# Patient Record
Sex: Female | Born: 1940 | Race: White | Hispanic: No | Marital: Married | Smoking: Former smoker
Health system: Southern US, Community
[De-identification: ages and names within clinical notes are randomized; demographics above are authoritative.]

## PROBLEM LIST (undated history)

## (undated) DIAGNOSIS — E119 Type 2 diabetes mellitus without complications: Secondary | ICD-10-CM

## (undated) DIAGNOSIS — Z5189 Encounter for other specified aftercare: Secondary | ICD-10-CM

## (undated) DIAGNOSIS — I2510 Atherosclerotic heart disease of native coronary artery without angina pectoris: Secondary | ICD-10-CM

## (undated) DIAGNOSIS — I10 Essential (primary) hypertension: Secondary | ICD-10-CM

## (undated) DIAGNOSIS — F329 Major depressive disorder, single episode, unspecified: Secondary | ICD-10-CM

## (undated) DIAGNOSIS — I471 Supraventricular tachycardia, unspecified: Secondary | ICD-10-CM

## (undated) DIAGNOSIS — F32A Depression, unspecified: Secondary | ICD-10-CM

## (undated) DIAGNOSIS — F419 Anxiety disorder, unspecified: Secondary | ICD-10-CM

## (undated) DIAGNOSIS — E039 Hypothyroidism, unspecified: Secondary | ICD-10-CM

## (undated) DIAGNOSIS — I719 Aortic aneurysm of unspecified site, without rupture: Secondary | ICD-10-CM

## (undated) DIAGNOSIS — I341 Nonrheumatic mitral (valve) prolapse: Secondary | ICD-10-CM

## (undated) DIAGNOSIS — T7840XA Allergy, unspecified, initial encounter: Secondary | ICD-10-CM

## (undated) DIAGNOSIS — R911 Solitary pulmonary nodule: Secondary | ICD-10-CM

## (undated) DIAGNOSIS — M1990 Unspecified osteoarthritis, unspecified site: Secondary | ICD-10-CM

## (undated) DIAGNOSIS — I8390 Asymptomatic varicose veins of unspecified lower extremity: Secondary | ICD-10-CM

## (undated) DIAGNOSIS — J449 Chronic obstructive pulmonary disease, unspecified: Secondary | ICD-10-CM

## (undated) DIAGNOSIS — D649 Anemia, unspecified: Secondary | ICD-10-CM

## (undated) DIAGNOSIS — H269 Unspecified cataract: Secondary | ICD-10-CM

## (undated) HISTORY — DX: Solitary pulmonary nodule: R91.1

## (undated) HISTORY — DX: Anxiety disorder, unspecified: F41.9

## (undated) HISTORY — DX: Supraventricular tachycardia, unspecified: I47.10

## (undated) HISTORY — DX: Supraventricular tachycardia: I47.1

## (undated) HISTORY — DX: Depression, unspecified: F32.A

## (undated) HISTORY — DX: Unspecified cataract: H26.9

## (undated) HISTORY — DX: Atherosclerotic heart disease of native coronary artery without angina pectoris: I25.10

## (undated) HISTORY — DX: Chronic obstructive pulmonary disease, unspecified: J44.9

## (undated) HISTORY — DX: Nonrheumatic mitral (valve) prolapse: I34.1

## (undated) HISTORY — DX: Encounter for other specified aftercare: Z51.89

## (undated) HISTORY — PX: EYE SURGERY: SHX253

## (undated) HISTORY — DX: Aortic aneurysm of unspecified site, without rupture: I71.9

## (undated) HISTORY — DX: Allergy, unspecified, initial encounter: T78.40XA

## (undated) HISTORY — DX: Major depressive disorder, single episode, unspecified: F32.9

## (undated) HISTORY — DX: Hypothyroidism, unspecified: E03.9

## (undated) HISTORY — DX: Essential (primary) hypertension: I10

## (undated) HISTORY — DX: Unspecified osteoarthritis, unspecified site: M19.90

## (undated) HISTORY — DX: Anemia, unspecified: D64.9

## (undated) HISTORY — DX: Type 2 diabetes mellitus without complications: E11.9

## (undated) HISTORY — PX: BREAST SURGERY: SHX581

## (undated) HISTORY — DX: Asymptomatic varicose veins of unspecified lower extremity: I83.90

---
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
## 1970-07-04 HISTORY — PX: TUBAL LIGATION: SHX77

## 1973-07-04 HISTORY — PX: TOTAL ABDOMINAL HYSTERECTOMY: SHX209

## 2006-08-23 ENCOUNTER — Ambulatory Visit: Payer: Self-pay | Admitting: Cardiology

## 2006-09-06 ENCOUNTER — Ambulatory Visit: Payer: Self-pay

## 2006-11-03 ENCOUNTER — Ambulatory Visit: Payer: Self-pay | Admitting: Cardiology

## 2006-11-03 LAB — CONVERTED CEMR LAB
CO2: 31 meq/L (ref 19–32)
Creatinine, Ser: 1 mg/dL (ref 0.4–1.2)
Potassium: 4 meq/L (ref 3.5–5.1)
Sodium: 137 meq/L (ref 135–145)

## 2006-11-22 ENCOUNTER — Ambulatory Visit: Payer: Self-pay | Admitting: Cardiology

## 2006-11-29 ENCOUNTER — Ambulatory Visit: Payer: Self-pay | Admitting: Internal Medicine

## 2006-11-29 LAB — CONVERTED CEMR LAB
BUN: 9 mg/dL (ref 6–23)
Basophils Absolute: 0 10*3/uL (ref 0.0–0.1)
Basophils Relative: 0.6 % (ref 0.0–1.0)
CO2: 30 meq/L (ref 19–32)
GFR calc Af Amer: 81 mL/min
HCT: 36.4 % (ref 36.0–46.0)
Hemoglobin: 12.3 g/dL (ref 12.0–15.0)
Lymphocytes Relative: 23.4 % (ref 12.0–46.0)
MCHC: 33.8 g/dL (ref 30.0–36.0)
Monocytes Absolute: 0.9 10*3/uL — ABNORMAL HIGH (ref 0.2–0.7)
Monocytes Relative: 11.6 % — ABNORMAL HIGH (ref 3.0–11.0)
Neutro Abs: 4.9 10*3/uL (ref 1.4–7.7)
Neutrophils Relative %: 61.5 % (ref 43.0–77.0)
Potassium: 4 meq/L (ref 3.5–5.1)
Sodium: 135 meq/L (ref 135–145)

## 2006-12-05 ENCOUNTER — Ambulatory Visit: Payer: Self-pay | Admitting: Internal Medicine

## 2006-12-05 ENCOUNTER — Ambulatory Visit (HOSPITAL_COMMUNITY): Admission: RE | Admit: 2006-12-05 | Discharge: 2006-12-05 | Payer: Self-pay | Admitting: Internal Medicine

## 2006-12-22 ENCOUNTER — Ambulatory Visit: Payer: Self-pay | Admitting: Cardiothoracic Surgery

## 2007-01-04 ENCOUNTER — Ambulatory Visit (HOSPITAL_COMMUNITY): Admission: RE | Admit: 2007-01-04 | Discharge: 2007-01-04 | Payer: Self-pay | Admitting: Cardiothoracic Surgery

## 2007-01-16 ENCOUNTER — Ambulatory Visit: Payer: Self-pay | Admitting: Internal Medicine

## 2007-01-25 ENCOUNTER — Ambulatory Visit: Payer: Self-pay | Admitting: Internal Medicine

## 2007-01-25 ENCOUNTER — Ambulatory Visit (HOSPITAL_COMMUNITY): Admission: RE | Admit: 2007-01-25 | Discharge: 2007-01-25 | Payer: Self-pay | Admitting: Internal Medicine

## 2007-03-14 ENCOUNTER — Ambulatory Visit: Payer: Self-pay | Admitting: Cardiology

## 2007-03-14 LAB — CONVERTED CEMR LAB
BUN: 9 mg/dL (ref 6–23)
CO2: 33 meq/L — ABNORMAL HIGH (ref 19–32)
Creatinine, Ser: 0.8 mg/dL (ref 0.4–1.2)
GFR calc Af Amer: 93 mL/min
Glucose, Bld: 85 mg/dL (ref 70–99)
Potassium: 4.1 meq/L (ref 3.5–5.1)
Sodium: 134 meq/L — ABNORMAL LOW (ref 135–145)

## 2007-04-04 ENCOUNTER — Ambulatory Visit: Payer: Self-pay | Admitting: Emergency Medicine

## 2007-05-02 ENCOUNTER — Ambulatory Visit: Payer: Self-pay | Admitting: Emergency Medicine

## 2007-05-07 ENCOUNTER — Ambulatory Visit: Payer: Self-pay | Admitting: Cardiology

## 2007-05-07 ENCOUNTER — Encounter: Payer: Self-pay | Admitting: Cardiology

## 2007-05-07 ENCOUNTER — Ambulatory Visit: Payer: Self-pay

## 2007-06-04 ENCOUNTER — Ambulatory Visit: Payer: Self-pay | Admitting: Internal Medicine

## 2007-08-23 ENCOUNTER — Ambulatory Visit: Payer: Self-pay | Admitting: Cardiothoracic Surgery

## 2007-09-17 ENCOUNTER — Ambulatory Visit: Payer: Self-pay | Admitting: Cardiology

## 2007-09-17 LAB — CONVERTED CEMR LAB
BUN: 10 mg/dL (ref 6–23)
CO2: 33 meq/L — ABNORMAL HIGH (ref 19–32)
Creatinine, Ser: 0.8 mg/dL (ref 0.4–1.2)
GFR calc non Af Amer: 77 mL/min
Glucose, Bld: 92 mg/dL (ref 70–99)
TSH: 1.71 microintl units/mL (ref 0.35–5.50)

## 2007-09-27 ENCOUNTER — Ambulatory Visit: Payer: Self-pay

## 2007-09-27 ENCOUNTER — Encounter: Payer: Self-pay | Admitting: Cardiology

## 2007-11-08 ENCOUNTER — Encounter: Admission: RE | Admit: 2007-11-08 | Discharge: 2007-11-08 | Payer: Self-pay | Admitting: Cardiothoracic Surgery

## 2007-11-08 ENCOUNTER — Ambulatory Visit: Payer: Self-pay | Admitting: Cardiothoracic Surgery

## 2007-11-21 DIAGNOSIS — I868 Varicose veins of other specified sites: Secondary | ICD-10-CM | POA: Insufficient documentation

## 2007-11-21 DIAGNOSIS — I10 Essential (primary) hypertension: Secondary | ICD-10-CM

## 2007-11-21 DIAGNOSIS — E039 Hypothyroidism, unspecified: Secondary | ICD-10-CM | POA: Insufficient documentation

## 2007-11-21 DIAGNOSIS — Z8679 Personal history of other diseases of the circulatory system: Secondary | ICD-10-CM | POA: Insufficient documentation

## 2007-11-21 DIAGNOSIS — F329 Major depressive disorder, single episode, unspecified: Secondary | ICD-10-CM

## 2007-11-21 DIAGNOSIS — I059 Rheumatic mitral valve disease, unspecified: Secondary | ICD-10-CM | POA: Insufficient documentation

## 2007-11-21 DIAGNOSIS — J449 Chronic obstructive pulmonary disease, unspecified: Secondary | ICD-10-CM

## 2007-11-21 DIAGNOSIS — M129 Arthropathy, unspecified: Secondary | ICD-10-CM | POA: Insufficient documentation

## 2007-11-21 DIAGNOSIS — R911 Solitary pulmonary nodule: Secondary | ICD-10-CM | POA: Insufficient documentation

## 2007-11-22 ENCOUNTER — Ambulatory Visit: Payer: Self-pay | Admitting: Emergency Medicine

## 2007-12-24 ENCOUNTER — Ambulatory Visit: Payer: Self-pay | Admitting: Emergency Medicine

## 2007-12-24 DIAGNOSIS — J309 Allergic rhinitis, unspecified: Secondary | ICD-10-CM | POA: Insufficient documentation

## 2008-01-30 ENCOUNTER — Ambulatory Visit: Payer: Self-pay | Admitting: Emergency Medicine

## 2008-03-12 ENCOUNTER — Ambulatory Visit: Payer: Self-pay | Admitting: Emergency Medicine

## 2008-06-10 ENCOUNTER — Ambulatory Visit: Payer: Self-pay | Admitting: Emergency Medicine

## 2008-06-12 ENCOUNTER — Ambulatory Visit: Payer: Self-pay | Admitting: Cardiology

## 2008-06-16 ENCOUNTER — Telehealth (INDEPENDENT_AMBULATORY_CARE_PROVIDER_SITE_OTHER): Payer: Self-pay | Admitting: *Deleted

## 2008-07-21 ENCOUNTER — Telehealth (INDEPENDENT_AMBULATORY_CARE_PROVIDER_SITE_OTHER): Payer: Self-pay | Admitting: *Deleted

## 2008-09-01 ENCOUNTER — Ambulatory Visit: Payer: Self-pay | Admitting: Cardiology

## 2008-09-08 ENCOUNTER — Ambulatory Visit: Payer: Self-pay | Admitting: Emergency Medicine

## 2008-09-08 LAB — CONVERTED CEMR LAB
Chloride: 95 meq/L — ABNORMAL LOW (ref 96–112)
GFR calc non Af Amer: 77 mL/min
Potassium: 4.3 meq/L (ref 3.5–5.1)
Sodium: 139 meq/L (ref 135–145)

## 2008-09-19 ENCOUNTER — Encounter: Payer: Self-pay | Admitting: Emergency Medicine

## 2008-11-12 ENCOUNTER — Ambulatory Visit: Payer: Self-pay | Admitting: Cardiology

## 2008-11-19 ENCOUNTER — Encounter: Payer: Self-pay | Admitting: Emergency Medicine

## 2008-11-19 ENCOUNTER — Ambulatory Visit: Payer: Self-pay | Admitting: Cardiothoracic Surgery

## 2008-11-19 ENCOUNTER — Encounter: Payer: Self-pay | Admitting: Cardiology

## 2008-11-24 ENCOUNTER — Ambulatory Visit: Payer: Self-pay | Admitting: Emergency Medicine

## 2008-11-24 ENCOUNTER — Encounter: Payer: Self-pay | Admitting: Emergency Medicine

## 2009-03-30 ENCOUNTER — Ambulatory Visit: Payer: Self-pay | Admitting: Emergency Medicine

## 2009-05-01 ENCOUNTER — Ambulatory Visit: Payer: Self-pay | Admitting: Emergency Medicine

## 2009-05-01 DIAGNOSIS — R059 Cough, unspecified: Secondary | ICD-10-CM | POA: Insufficient documentation

## 2009-05-01 DIAGNOSIS — R05 Cough: Secondary | ICD-10-CM | POA: Insufficient documentation

## 2009-05-07 DIAGNOSIS — R0602 Shortness of breath: Secondary | ICD-10-CM

## 2009-05-07 DIAGNOSIS — I719 Aortic aneurysm of unspecified site, without rupture: Secondary | ICD-10-CM

## 2009-05-11 ENCOUNTER — Ambulatory Visit: Payer: Self-pay | Admitting: Cardiology

## 2009-05-11 DIAGNOSIS — R9431 Abnormal electrocardiogram [ECG] [EKG]: Secondary | ICD-10-CM

## 2009-05-11 DIAGNOSIS — I2510 Atherosclerotic heart disease of native coronary artery without angina pectoris: Secondary | ICD-10-CM | POA: Insufficient documentation

## 2009-05-26 ENCOUNTER — Ambulatory Visit: Payer: Self-pay | Admitting: Emergency Medicine

## 2009-06-01 ENCOUNTER — Ambulatory Visit (HOSPITAL_COMMUNITY): Admission: RE | Admit: 2009-06-01 | Discharge: 2009-06-01 | Payer: Self-pay | Admitting: Cardiology

## 2009-06-01 ENCOUNTER — Ambulatory Visit: Payer: Self-pay | Admitting: Cardiology

## 2009-06-01 ENCOUNTER — Encounter: Payer: Self-pay | Admitting: Cardiology

## 2009-06-01 ENCOUNTER — Ambulatory Visit: Payer: Self-pay

## 2009-06-01 ENCOUNTER — Ambulatory Visit: Payer: Self-pay | Admitting: Emergency Medicine

## 2009-08-03 ENCOUNTER — Telehealth (INDEPENDENT_AMBULATORY_CARE_PROVIDER_SITE_OTHER): Payer: Self-pay | Admitting: *Deleted

## 2009-09-02 ENCOUNTER — Ambulatory Visit: Payer: Self-pay | Admitting: Emergency Medicine

## 2009-09-14 ENCOUNTER — Encounter: Payer: Self-pay | Admitting: Emergency Medicine

## 2009-11-19 ENCOUNTER — Ambulatory Visit: Payer: Self-pay | Admitting: Cardiothoracic Surgery

## 2009-11-19 ENCOUNTER — Encounter: Admission: RE | Admit: 2009-11-19 | Discharge: 2009-11-19 | Payer: Self-pay | Admitting: Cardiothoracic Surgery

## 2010-01-11 ENCOUNTER — Ambulatory Visit: Payer: Self-pay | Admitting: Emergency Medicine

## 2010-01-13 ENCOUNTER — Telehealth (INDEPENDENT_AMBULATORY_CARE_PROVIDER_SITE_OTHER): Payer: Self-pay | Admitting: *Deleted

## 2010-04-27 ENCOUNTER — Telehealth (INDEPENDENT_AMBULATORY_CARE_PROVIDER_SITE_OTHER): Payer: Self-pay | Admitting: *Deleted

## 2010-05-20 ENCOUNTER — Ambulatory Visit: Payer: Self-pay | Admitting: Emergency Medicine

## 2010-05-31 ENCOUNTER — Encounter: Payer: Self-pay | Admitting: Cardiology

## 2010-05-31 ENCOUNTER — Ambulatory Visit: Payer: Self-pay | Admitting: Cardiology

## 2010-05-31 DIAGNOSIS — E785 Hyperlipidemia, unspecified: Secondary | ICD-10-CM

## 2010-06-01 ENCOUNTER — Encounter (INDEPENDENT_AMBULATORY_CARE_PROVIDER_SITE_OTHER): Payer: Self-pay | Admitting: *Deleted

## 2010-06-01 LAB — CONVERTED CEMR LAB
AST: 22 units/L (ref 0–37)
Albumin: 4.3 g/dL (ref 3.5–5.2)
CO2: 33 meq/L — ABNORMAL HIGH (ref 19–32)
Calcium: 9.7 mg/dL (ref 8.4–10.5)
Chloride: 96 meq/L (ref 96–112)
HDL: 46.6 mg/dL (ref >39.00)
LDL Cholesterol: 75 mg/dL (ref 0–99)
Potassium: 4.3 meq/L (ref 3.5–5.1)
Sodium: 139 meq/L (ref 135–145)
Total CHOL/HDL Ratio: 3
Triglycerides: 119 mg/dL (ref 0.0–149.0)

## 2010-07-25 ENCOUNTER — Encounter: Payer: Self-pay | Admitting: Thoracic Surgery (Cardiothoracic Vascular Surgery)

## 2010-08-03 NOTE — Letter (Signed)
Summary: CMN for Oxygen/[facility]  CMN for Oxygen/[facility]   Imported By: Sherian Rein 09/16/2009 12:24:50  _____________________________________________________________________  External Attachment:    Type:   Image     Comment:   External Document

## 2010-08-03 NOTE — Assessment & Plan Note (Signed)
Summary: COPD   Visit Type:  Follow-up Primary Provider/Referring Provider:  [name], 5 Points  CC:  4 mo COPD follow-up.  The patient says her breathing is worse with exertion. She has noticed increased SOB x2-3 months. Cough is both prod and non-prod.[name]  History of Present Illness: Morgan Hays is a 70 year old woman who follows up today for her COPD, dyspnea, and for a history of a RLL pulmonary nodule. 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 No sgy has been planned due to her significant lung disease.  Also with hx depression.   ROV 05/26/09 --  Began to experience more cough about 3 days ago, runny nose x 3 days, greenish phlegm. More wheeze, more SOB. In retrospect, she doesn't believe that the change from lisinopril to benecar really chaged her dry nagging cough.   ROV 06/01/09 -- Treated last time for acute exacerbation, finishing Pred + doxy. Using rescue rarely now. Almost back to baseline.   ROV 09/02/09 -- Regular f/u visit. No exacerbations since last visit. Able to do her usual activities, sometimes has to stop to rest. No cough. Ocas wheeze, responds to Proventil. Occas neb use also. Tells me that she had Pneumovax in 04/2009.   ROV 01/11/10 -- regular f/u for her COPD, cough. Tells me that about 3 weeks ago noticed some worsening DOE, some increased cough. Unfortunately since our last visit her husband was dx with colon CA. The humidity and heat are bothering her breathing. Cough is non-productive. having a lot of PND. Takes loratadine and fluticasone. Takes her BD's reliably.   Current Medications (verified): 1)  Levoxyl 125 Mcg Tabs (Levothyroxine Sodium) .[name].. 1 By Mouth Daily 2)  Benicar Hct 20-12.5 Mg Tabs (Olmesartan Medoxomil-Hctz) .[name].. 1 Tab By Mouth Once Daily 3)  Adult Aspirin Ec Low Strength 81 Mg  Tbec (Aspirin) .... Take 1 Tablet By Mouth Once A Day 4)  Multivitamins   Tabs (Multiple Vitamin) .... Take 1 Tablet By Mouth Once A Day 5)  Calcium 500 500 Mg   Tabs (Calcium Carbonate) .... Take 1 Tablet By Mouth Two Times A Day 6)  Ativan 1 Mg  Tabs (Lorazepam) .[name].. 1 Two Times A Day 7)  Proventil Hfa 108 (90 Base) Mcg/act  Aers (Albuterol Sulfate) .... Inhale 2 Puffs Every 4 Hours As Needed 8)  Miralax   Powd (Polyethylene Glycol 3350) .... As Needed 9)  Symbicort 160-4.5 Mcg/act  Aero (Budesonide-Formoterol Fumarate) .... Inhale 2 Puffs Two Times A Day 10)  Flonase 50 Mcg/act Susp (Fluticasone Propionate) .... Take 2 Sprays in Each Nostril Once A Day 11)  Albuterol Sulfate (2.5 Mg/79ml) 0.083% Nebu (Albuterol Sulfate) .[name].. 1 Vial Three Times A Day in Nebulizer 12)  Loratadine 10 Mg Tabs (Loratadine) .[name].. 1 By Mouth Once Daily 13)  Sertraline Hcl 100 Mg Tabs (Sertraline Hcl) .... 1/2 By Mouth Two Times A Day 14)  Spiriva Handihaler 18 Mcg Caps (Tiotropium Bromide Monohydrate) .... As Directed 15)  Oxygen .... As Directed 16)  Pravastatin Sodium 40 Mg Tabs (Pravastatin Sodium) .... Take One Tablet By Mouth Daily At Bedtime 17)  Trazodone Hcl 50 Mg Tabs (Trazodone Hcl) .[name].. 1 By Mouth At Bedtime 18)  Vitamin D 1000 Unit Tabs (Cholecalciferol) .[name].. 1 By Mouth Daily  Allergies (verified): No Known Drug Allergies  Vital Signs:  Patient profile:   70 year old female Height:      64 inches (162.56 cm) Weight:      168.38 pounds (76.54 kg) BMI:  29.01 O2 Sat:      90 % on 2 L/min Temp:     98.1 degrees F (36.72 degrees C) oral Pulse rate:   97 / minute BP sitting:   110 / 62  (left arm) Cuff size:   regular  Vitals Entered By: Michel Bickers CMA (January 11, 2010 1:23 PM)  O2 Sat at Rest %:  90 O2 Flow:  2 L/min CC: 4 mo COPD follow-up.  The patient says her breathing is worse with exertion. She has noticed increased SOB x2-3 months. Cough is both prod and non-prod. Comments Medications reviewed. Daytime phone verified. Michel Bickers CMA  January 11, 2010 1:24 PM   Physical Exam  General:  obese, elderly woman, NAD Head:  normocephalic and  atraumatic Nose:  no deformity, discharge, inflammation, or lesions Mouth:  erythema post pharynx Neck:  no stridor Lungs:  distant, no wheeze Heart:  regular rate and rhythm, S1, S2 without murmurs, rubs, gallops, or clicks Abdomen:  not examined Msk:  no deformity or scoliosis noted with normal posture Extremities:  trace edema Neurologic:  non-focal Skin:  intact without lesions or rashes Psych:  alert and cooperative; normal mood and affect; normal attention span and concentration   Impression & Recommendations:  Problem # 1:  C O P D (ICD-496)  Problem # 2:  COUGH (ICD-786.2)  Orders: Est. Patient Level IV ([address])  Problem # 3:  PULMONARY NODULE (ICD-518.89) Stable > 2 yrs  Problem # 4:  ALLERGIC RHINITIS (ICD-477.9)  Her updated medication list for this problem includes:    Flonase 50 Mcg/act Susp (Fluticasone propionate) .[name]... Take 2 sprays in each nostril once a day    Loratadine 10 Mg Tabs (Loratadine) .[name]... 1 by mouth once daily  Medications Added to Medication List This Visit: 1)  Levoxyl 125 Mcg Tabs (Levothyroxine sodium) .[name].. 1 by mouth daily 2)  Sertraline Hcl 100 Mg Tabs (Sertraline hcl) .... 1/2 by mouth two times a day 3)  Trazodone Hcl 50 Mg Tabs (Trazodone hcl) .[name].. 1 by mouth at bedtime 4)  Vitamin D 1000 Unit Tabs (Cholecalciferol) .[name].. 1 by mouth daily  Other Orders: Prescription Created Electronically [id number])  Patient Instructions: 1)  Please continue your current medications.  2)  Use your oxygen at all times.  3)  Increase your fluticisaone spray to 2 sprays each nostril two times a day  4)  Follow with Dr Delton Coombes in 4 months or as needed  Prescriptions: SPIRIVA HANDIHALER 18 MCG CAPS (TIOTROPIUM BROMIDE MONOHYDRATE) as directed  #30 x 11   Entered and Authorized by:   Leslye Peer MD   Signed by:   Leslye Peer MD on 01/11/2010   Method used:   Electronically to        [facility]* (retail)       [address] [place], [place]  [id number]       Ph: [phone]       Fax: [phone]   RxID:   [id number] LORATADINE 10 MG TABS (LORATADINE) 1 by mouth once daily  #30 x 11   Entered and Authorized by:   Leslye Peer MD   Signed by:   Leslye Peer MD on 01/11/2010   Method used:   Electronically to        [facility]* (retail)       [address]  [place], [place]  [address]       Ph: [phone]       Fax: [phone]   RxID:   [id number] ALBUTEROL SULFATE (2.5 MG/3ML) 0.083% NEBU (ALBUTEROL SULFATE) 1 vial three times a day in nebulizer  #1 box x 11   Entered and Authorized by:   Leslye Peer MD   Signed by:   Leslye Peer MD on 01/11/2010   Method used:   Electronically to        [facility]* (retail)       [address] [place], [place]  [id number]       Ph: [phone]       Fax: [phone]   RxID:   [id number] FLONASE 50 MCG/ACT SUSP (FLUTICASONE PROPIONATE) Take 2 sprays in each nostril once a day  #1 x 5   Entered and Authorized by:   Leslye Peer MD   Signed by:   Leslye Peer MD on 01/11/2010   Method used:   Electronically to        [facility]* (retail)       [address] [place], [place]  [id number]       Ph: [phone]       Fax: [phone]   RxID:   [id number] SYMBICORT 160-4.5 MCG/ACT  AERO (BUDESONIDE-FORMOTEROL FUMARATE) Inhale 2 puffs two times a day  #1 x 11   Entered and Authorized by:   Leslye Peer MD   Signed by:   Leslye Peer MD on 01/11/2010   Method used:   Electronically to        [facility]* (retail)       [address] [place], [place]  [address]       Ph: [phone]       Fax: [phone]   RxID:   [id number] PROVENTIL HFA 108 (90 BASE) MCG/ACT  AERS (ALBUTEROL SULFATE) Inhale 2 puffs every 4 hours as  needed  #1 x 5   Entered and Authorized by:   Leslye Peer MD   Signed by:   Leslye Peer MD on 01/11/2010   Method used:   Electronically to        [facility]* (retail)       [address] [place], [place]  [address]       Ph: [phone]       Fax: [phone]   RxID:   [id number]

## 2010-08-03 NOTE — Progress Notes (Signed)
Summary: order  Phone Note Call from Patient   Caller: Patient Call For: byrum Summary of Call: [facility] need new order for oxygen Initial call taken by: Rickard Patience,  August 03, 2009 3:50 PM  Follow-up for Phone Call        order placed in computer and order sent to [facility] [address] [facility]  August 03, 2009 4:02 PM   Additional Follow-up for Phone Call Additional follow up Details #1::        order faxed to [facility] in [place] to get pt 02 2lpm with exertion  Additional Follow-up by: Oneita Jolly,  August 03, 2009 4:07 PM

## 2010-08-03 NOTE — Assessment & Plan Note (Signed)
Summary: yearly/sl   Primary Provider:  [name], 5 Points   History of Present Illness: Morgan Hays is a pleasant female with a thoracic aneurysm, severe COPD, and paroxysmal atrial tachycardia. A Myoview  performed on September 27, 2007 revealed  normal LV function with an estimated ejection fraction of 78%.  There was normal perfusion.  She also had an echocardiogram in Nov 2010, that showed normal LV function, grade 1 diastolic dysfunction, aortic aneurysm, mild AI and mild to moderate MR. She also has a history of suicidal ideation. Her last CT scan was performed in May of 2010 and revealed stable aneurysm of the ascending aorta measuring 51 x 49  mm in axial dimension compared to 52 x 50 mm on prior. She is followed by Dr. Tyrone Sage  for this issue. We last saw her in November of 2010. Since then she continues to have dyspnea on exertion relieved with rest. It has worsened compared to previous. It is not associated with chest pain. There is no orthopnea, PND, pedal edema, palpitations, syncope or exertional chest pain. Note her previous CAT scan also revealed calcium in her coronary arteries.  Current Medications (verified): 1)  Levoxyl 125 Mcg Tabs (Levothyroxine Sodium) .[name].. 1 By Mouth Daily 2)  Benicar Hct 20-12.5 Mg Tabs (Olmesartan Medoxomil-Hctz) .[name].. 1 Tab By Mouth Once Daily 3)  Adult Aspirin Ec Low Strength 81 Mg  Tbec (Aspirin) .... Take 1 Tablet By Mouth Once A Day 4)  Multivitamins   Tabs (Multiple Vitamin) .... Take 1 Tablet By Mouth Once A Day 5)  Calcium 500 500 Mg  Tabs (Calcium Carbonate) .... Take 1 Tablet By Mouth Two Times A Day 6)  Ativan 1 Mg  Tabs (Lorazepam) .[name].. 1 Two Times A Day 7)  Proventil Hfa 108 (90 Base) Mcg/act  Aers (Albuterol Sulfate) .... Inhale 2 Puffs Every 4 Hours As Needed 8)  Miralax   Powd (Polyethylene Glycol 3350) .... As Needed 9)  Symbicort 160-4.5 Mcg/act  Aero (Budesonide-Formoterol Fumarate) .... Inhale 2 Puffs Two Times A Day 10)  Flonase 50  Mcg/act Susp (Fluticasone Propionate) .... Take 2 Sprays in Each Nostril Once A Day 11)  Albuterol Sulfate (2.5 Mg/88ml) 0.083% Nebu (Albuterol Sulfate) .[name].. 1 Vial Three Times A Day in Nebulizer 12)  Loratadine 10 Mg Tabs (Loratadine) .[name].. 1 By Mouth Once Daily 13)  Spiriva Handihaler 18 Mcg Caps (Tiotropium Bromide Monohydrate) .... As Directed 14)  Oxygen .... As Directed 15)  Pravastatin Sodium 40 Mg Tabs (Pravastatin Sodium) .... Take One Tablet By Mouth Daily At Bedtime 16)  Vitamin D 1000 Unit Tabs (Cholecalciferol) .[name].. 1 By Mouth Daily 17)  Sertraline Hcl 50 Mg Tabs (Sertraline Hcl) .[name].. 1 Tab By Mouth Two Times A Day 18)  Trazodone Hcl 50 Mg Tabs (Trazodone Hcl) .[name].. 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 Appendectomy-1956 Hysterectomy-1975 Tubal ligation-1972  History of benign left breast cyst status post excision.   Social History: Reviewed history from 05/07/2009 and no changes required.  The patient is married and lives with her husband.  She   works as an Public house manager.  She has not had any known TB exposures.  She is a  former smoker and quit in 2001.  She has approximately 50 pack year   total history.  She does not use alcohol.      Review of Systems       Patient lost her husband in October of this year and is appropriately emotional. no fevers or chills, productive cough, hemoptysis, dysphasia, odynophagia, melena, hematochezia, dysuria, hematuria, rash, seizure activity, orthopnea, PND, pedal edema, claudication. Remaining systems are  negative.   Vital Signs:  Patient profile:   70 year old female Height:      64 inches Weight:      168 pounds BMI:     28.94 Pulse rate:   89 / minute Resp:     16 per minute BP sitting:   127 / 77  (left arm)  Vitals Entered By: Kem Parkinson (May 31, 2010 11:50 AM)  Physical Exam  General:  Well-developed well-nourished in no acute distress.  Skin is warm and dry.  HEENT is normal.  Neck is supple. No thyromegaly.  Chest reveals diminished breath sounds throughout. Cardiovascular exam is regular rate and rhythm.  Abdominal exam nontender or distended. No masses palpated. Extremities show trace edema. neuro grossly intact    EKG  Procedure date:  05/31/2010  Findings:      Sinus rhythm, right atrial enlargement, RV conduction delay.  Impression & Recommendations:  Problem # 1:  CORONARY ARTERY DISEASE (ICD-414.00) Patient has presumed coronary disease based on calcium noted on CAT scan. Continue aspirin and statin. Previous Myoview showed no ischemia. Her updated medication list for this problem includes:    Adult Aspirin Ec Low Strength 81 Mg Tbec (Aspirin) .[name]... Take 1 tablet by mouth once a day  Orders: TLB-Lipid Panel (80061-LIPID) TLB-Hepatic/Liver Function Pnl (80076-HEPATIC)  Problem # 2:  SUPRAVENTRICULAR TACHYCARDIA, HX OF (ICD-V12.59) No recurrences. Her updated medication list for this problem includes:    Adult Aspirin Ec Low Strength 81 Mg Tbec (Aspirin) .[name]... Take 1 tablet by mouth once a day  Her updated medication list for this problem includes:    Adult Aspirin Ec Low Strength 81 Mg Tbec (Aspirin) .[name]... Take 1 tablet by mouth once a day  Problem # 3:  HYPERTENSION (ICD-401.9) Blood pressure controlled on present medications. Will continue. Check potassium and renal function. Her updated medication list for this problem includes:    Benicar Hct 20-12.5 Mg Tabs (Olmesartan medoxomil-hctz) .[name]... 1 tab by mouth once daily    Adult  Aspirin Ec Low Strength 81 Mg Tbec (Aspirin) .[name]... Take 1 tablet by mouth once a day  Orders: TLB-BMP (Basic Metabolic Panel-BMET) (80048-METABOL)  Her updated medication list for this problem includes:    Benicar Hct 20-12.5 Mg Tabs (Olmesartan medoxomil-hctz) .[name]... 1 tab by mouth once daily    Adult Aspirin Ec Low Strength 81 Mg Tbec (Aspirin) .[name]... Take 1 tablet by mouth once a day  Problem # 4:  AORTIC ANEURYSM (ICD-441.9) Managed by thoracic surgery. Given severity of COPD, felt most likely not to be a surgical candidate based on previous notes by Dr. Lowella Fairy.  Problem # 5:  HYPOTHYROIDISM (ICD-244.9)  Her updated medication list for this problem includes:    Levoxyl 125 Mcg Tabs (Levothyroxine sodium) .[name]... 1 by mouth daily  Problem # 6:  C O P D (ICD-496) Management per pulmonary. Her updated medication list for this problem includes:    Proventil Hfa 108 (90 Base) Mcg/act Aers (Albuterol sulfate) ..... Inhale 2 puffs every 4 hours as needed  Symbicort 160-4.5 Mcg/act Aero (Budesonide-formoterol fumarate) ..... Inhale 2 puffs two times a day    Albuterol Sulfate (2.5 Mg/3ml) 0.083% Nebu (Albuterol sulfate) .[name]... 1 vial three times a day in nebulizer    Spiriva Handihaler 18 Mcg Caps (Tiotropium bromide monohydrate) .[name]... As directed  Problem # 7:  HYPERLIPIDEMIA (ICD-272.4) Continue statin. Check lipids and liver. Her updated medication list for this problem includes:    Pravastatin Sodium 40 Mg Tabs (Pravastatin sodium) .[name]... Take one tablet by mouth daily at bedtime  Patient Instructions: 1)  Your physician wants you to follow-up in: ONE YEAR  You will receive a reminder letter in the mail two months in advance. If you don't receive a letter, please call our office to schedule the follow-up appointment. Prescriptions: PRAVASTATIN SODIUM 40 MG TABS (PRAVASTATIN SODIUM) Take one tablet by mouth daily at bedtime  #30 Tablet x 12   Entered by:   Deliah Goody, RN   Authorized  by:   Ferman Hamming, MD, [facility]   Signed by:   Deliah Goody, RN on 05/31/2010   Method used:   Electronically to        [facility]* (retail)       [address] [place], [place]  [id number]       Ph: [phone]       Fax: [phone]   RxID:   [id number]

## 2010-08-03 NOTE — Assessment & Plan Note (Signed)
Summary: COPD, alergies   Visit Type:  Follow-up Primary Morgan Hays/Referring Morgan Hays:  Morgan Hays, 5 Points  CC:  COPD...pt c/o increased SOB with exertion x1-2 months and a dry cough x1-2 days...pt's husband passed away 1 month ago from cancer.  History of Present Illness: [name] is a 70 year old woman who follows up today for her COPD, dyspnea, and for a history of a RLL pulmonary nodule. 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 No sgy has been planned due to her significant lung disease.  Also with hx depression.   ROV 05/26/09 --  Began to experience more cough about 3 days ago, runny nose x 3 days, greenish phlegm. More wheeze, more SOB. In retrospect, she doesn't believe that the change from lisinopril to benecar really chaged her dry nagging cough.   ROV 06/01/09 -- Treated last time for acute exacerbation, finishing Pred + doxy. Using rescue rarely now. Almost back to baseline.   ROV 09/02/09 -- Regular f/u visit. No exacerbations since last visit. Able to do her usual activities, sometimes has to stop to rest. No cough. Ocas wheeze, responds to Proventil. Occas neb use also. Tells me that she had Pneumovax in 04/2009.   ROV 01/11/10 -- regular f/u for her COPD, cough. Tells me that about 3 weeks ago noticed some worsening DOE, some increased cough. Unfortunately since our last visit her husband was dx with colon CA. The humidity and heat are bothering her breathing. Cough is non-productive. having a lot of PND. Takes loratadine and fluticasone. Takes her BD's reliably.   ROV 05/20/10 -- COPD, cough, allergies, stable nodule. Her husband just died of colon CA, she has been very emotional and having a hard time. She has had to exert during this period, notices her limitations w breathing. Rare SABA use.   Current Medications (verified): 1)  Levoxyl 125 Mcg Tabs (Levothyroxine Sodium) .[name].. 1 By Mouth Daily 2)  Benicar Hct 20-12.5 Mg Tabs (Olmesartan Medoxomil-Hctz) .[name].. 1  Tab By Mouth Once Daily 3)  Adult Aspirin Ec Low Strength 81 Mg  Tbec (Aspirin) .... Take 1 Tablet By Mouth Once A Day 4)  Multivitamins   Tabs (Multiple Vitamin) .... Take 1 Tablet By Mouth Once A Day 5)  Calcium 500 500 Mg  Tabs (Calcium Carbonate) .... Take 1 Tablet By Mouth Two Times A Day 6)  Ativan 1 Mg  Tabs (Lorazepam) .[name].. 1 Two Times A Day 7)  Proventil Hfa 108 (90 Base) Mcg/act  Aers (Albuterol Sulfate) .... Inhale 2 Puffs Every 4 Hours As Needed 8)  Miralax   Powd (Polyethylene Glycol 3350) .... As Needed 9)  Symbicort 160-4.5 Mcg/act  Aero (Budesonide-Formoterol Fumarate) .... Inhale 2 Puffs Two Times A Day 10)  Flonase 50 Mcg/act Susp (Fluticasone Propionate) .... Take 2 Sprays in Each Nostril Once A Day 11)  Albuterol Sulfate (2.5 Mg/24ml) 0.083% Nebu (Albuterol Sulfate) .[name].. 1 Vial Three Times A Day in Nebulizer 12)  Loratadine 10 Mg Tabs (Loratadine) .[name].. 1 By Mouth Once Daily 13)  Spiriva Handihaler 18 Mcg Caps (Tiotropium Bromide Monohydrate) .... As Directed 14)  Oxygen .... As Directed 15)  Pravastatin Sodium 40 Mg Tabs (Pravastatin Sodium) .... Take One Tablet By Mouth Daily At Bedtime 16)  Vitamin D 1000 Unit Tabs (Cholecalciferol) .[name].. 1 By Mouth Daily  Allergies (verified): No Known Drug Allergies  Vital Signs:  Patient profile:   70 year old female Height:      64 inches (162.56 cm) Weight:  169.25 pounds (76.93 kg) BMI:     29.16 O2 Sat:      91 % on 2 L/min Temp:     97.5 degrees F (36.39 degrees C) oral Pulse rate:   111 / minute BP sitting:   120 / 70  (left arm) Cuff size:   regular  Vitals Entered By: Michel Bickers CMA (May 20, 2010 10:56 AM)  O2 Sat at Rest %:  91 O2 Flow:  2 L/min CC: COPD...pt c/o increased SOB with exertion x1-2 months and a dry cough x1-2 days...pt's husband passed away 1 month ago from cancer Comments Medications reviewed with patient Michel Bickers [facility]  May 20, 2010 11:07 AM   Physical Exam  General:  obese,  elderly woman, NAD, tearful today.  Head:  normocephalic and atraumatic Nose:  no deformity, discharge, inflammation, or lesions Mouth:  erythema post pharynx Neck:  no stridor Lungs:  distant, no wheeze Heart:  regular rate and rhythm, S1, S2 without murmurs, rubs, gallops, or clicks Abdomen:  not examined Msk:  no deformity or scoliosis noted with normal posture Extremities:  trace edema Neurologic:  non-focal Skin:  intact without lesions or rashes Psych:  alert and cooperative; appropriately sad after death of her husband, normal attention span and concentration   Impression & Recommendations:  Problem # 1:  C O P D (ICD-496)  Problem # 2:  ALLERGIC RHINITIS (ICD-477.9)  Her updated medication list for this problem includes:    Flonase 50 Mcg/act Susp (Fluticasone propionate) .[name]... Take 2 sprays in each nostril once a day    Loratadine 10 Mg Tabs (Loratadine) .[name]... 1 by mouth once daily  Orders: Est. Patient Level IV (42706)  Problem # 3:  PULMONARY NODULE (ICD-518.89)  Stable over 2 yrs  Orders: Est. Patient Level IV (23762)  Patient Instructions: 1)  Please continue your inhaled medications as you are taking them 2)  Continue your oxygen 3)  Continue your loratadine and fluticasone spray.  4)  Start doing nasal saline washes every day 5)  Follow with Dr Delton Coombes in 4 months or as needed    Immunization History:  Influenza Immunization History:    Influenza:  historical (03/14/2010)

## 2010-08-03 NOTE — Progress Notes (Signed)
Summary: need diagnosis  Phone Note From Pharmacy Call back at [phone]   Caller: Patient Caller: [facility] ( susan Call For: byrum  Summary of Call: need diag for albuterol  Initial call taken by: Rickard Patience,  January 13, 2010 9:09 AM  Follow-up for Phone Call        Dx codes from office visit on 2010-02-03 were given to Darl Pikes at [facility].Abigail Miyamoto RN  January 13, 2010 9:17 AM

## 2010-08-03 NOTE — Letter (Signed)
Summary: Custom - Lipid  [facility], Main Office  [address]   [place], [place] [address]   Phone: [phone]  Fax: [phone]     June 01, 2010 MRN: [id number]   Morgan Hays [address] [place], [place]  [address]   Dear [name],  We have reviewed your cholesterol results.  They are as follows:     Total Cholesterol:    145 (Desirable: less than 200)       HDL  Cholesterol:     46.60  (Desirable: greater than 40 for men and 50 for women)       LDL Cholesterol:       75  (Desirable: less than 100 for low risk and less than 70 for moderate to high risk)       Triglycerides:       119.0  (Desirable: less than 150)  Our recommendations include:These numbers look good. Continue on the same medicine. Sodium, potassium, kidney and Liver function are normal. Take care, Dr. Darel Hong.    Call our office at the number listed above if you have any questions.  Lowering your LDL cholesterol is important, but it is only one of a large number of "risk factors" that may indicate that you are at risk for heart disease, stroke or other complications of hardening of the arteries.  Other risk factors include:   A.  Cigarette Smoking* B.  High Blood Pressure* C.  Obesity* D.   Low HDL Cholesterol (see yours above)* E.   Diabetes Mellitus (higher risk if your is uncontrolled) F.  Family history of premature heart disease G.  Previous history of stroke or cardiovascular disease    *These are risk factors YOU HAVE CONTROL OVER.  For more information, visit .  There is now evidence that lowering the TOTAL CHOLESTEROL AND LDL CHOLESTEROL can reduce the risk of heart disease.  The American Heart Association recommends the following guidelines for the treatment of elevated cholesterol:  1.  If there is now current heart disease and less than two risk factors, TOTAL CHOLESTEROL should be less than 200 and LDL CHOLESTEROL should be less than 100. 2.  If there is  current heart disease or two or more risk factors, TOTAL CHOLESTEROL should be less than 200 and LDL CHOLESTEROL should be less than 70.  A diet low in cholesterol, saturated fat, and calories is the cornerstone of treatment for elevated cholesterol.  Cessation of smoking and exercise are also important in the management of elevated cholesterol and preventing vascular disease.  Studies have shown that 30 to 60 minutes of physical activity most days can help lower blood pressure, lower cholesterol, and keep your weight at a healthy level.  Drug therapy is used when cholesterol levels do not respond to therapeutic lifestyle changes (smoking cessation, diet, and exercise) and remains unacceptably high.  If medication is started, it is important to have you levels checked periodically to evaluate the need for further treatment options.  Thank you,    [employer] Team

## 2010-08-03 NOTE — Assessment & Plan Note (Signed)
Summary: COPD, stable nodule   Visit Type:  Follow-up Primary Provider/Referring Provider:  [name], 5 Points  CC:  4 month followup COPD.  Pt states that her breathing is the same- no better or worse. She c/o occ wheeze- states that this is nothing new for her.  No new complaints today.Morgan Hays  History of Present Illness: Morgan Hays is a 70 year old woman who follows up today for her COPD, dyspnea, and for a history of a RLL pulmonary nodule. 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 No sgy has been planned due to her significant lung disease.  Also with hx depression.   ROV 03/30/09 -- returns for follow up, tells me that she is having more exertional SOB for several months. Has had more cough, green phlegm. Was treated for ? CAP 1 month ago with augmentin, then later azithro. Didn't really change her cough or mucous. Wt has been stable. Having some nasal gtt.   ROV 05/01/09 -- returns for follow up after we treated her for a mild flare last time with pred, had already been treated with abx. Still coughing, sometimes green phlegm. c/o dry mouth. Has been on lisinopril/HCTZ for many yrs.   ROV 05/26/09 --  Began to experience more cough about 3 days ago, runny nose x 3 days, greenish phlegm. More wheeze, more SOB. In retrospect, she doesn't believe that the change from lisinopril to benecar really chaged her dry nagging cough.   ROV 06/01/09 -- Treated last time for acute exacerbation, finishing Pred + doxy. Using rescue rarely now. Almost back to baseline.   ROV 09/02/09 -- Regular f/u visit. No exacerbations since last visit. Able to do her usual activities, sometimes has to stop to rest. No cough. Ocas wheeze, responds to Proventil. Occas neb use also. Tells me that she had Pneumovax in 04/2009.   Current Medications (verified): 1)  Levoxyl 150 Mcg Tabs (Levothyroxine Sodium) .Morgan Hays.. 1 By Mouth Daily 2)  Benicar Hct 20-12.5 Mg Tabs (Olmesartan Medoxomil-Hctz) .Morgan Hays.. 1 Tab By Mouth Once  Daily 3)  Adult Aspirin Ec Low Strength 81 Mg  Tbec (Aspirin) .... Take 1 Tablet By Mouth Once A Day 4)  Multivitamins   Tabs (Multiple Vitamin) .... Take 1 Tablet By Mouth Once A Day 5)  Calcium 500 500 Mg  Tabs (Calcium Carbonate) .... Take 1 Tablet By Mouth Two Times A Day 6)  Ativan 1 Mg  Tabs (Lorazepam) .Morgan Hays.. 1 Two Times A Day 7)  Proventil Hfa 108 (90 Base) Mcg/act  Aers (Albuterol Sulfate) .... Inhale 2 Puffs Every 4 Hours As Needed 8)  Miralax   Powd (Polyethylene Glycol 3350) .... As Needed 9)  Symbicort 160-4.5 Mcg/act  Aero (Budesonide-Formoterol Fumarate) .... Inhale 2 Puffs Two Times A Day 10)  Flonase 50 Mcg/act Susp (Fluticasone Propionate) .... Take 2 Sprays in Each Nostril Once A Day 11)  Albuterol Sulfate (2.5 Mg/35ml) 0.083% Nebu (Albuterol Sulfate) .Morgan Hays.. 1 Vial Three Times A Day in Nebulizer 12)  Venlafaxine Hcl 75 Mg Tabs (Venlafaxine Hcl) .Morgan Hays.. 1 By Mouth Daily 13)  Loratadine 10 Mg Tabs (Loratadine) .Morgan Hays.. 1 By Mouth Once Daily 14)  Venlafaxine Hcl 37.5 Mg Tabs (Venlafaxine Hcl) .Morgan Hays.. 1 By Mouth Daily 15)  Spiriva Handihaler 18 Mcg Caps (Tiotropium Bromide Monohydrate) .... As Directed 16)  Oxygen .... As Directed 17)  Pravastatin Sodium 40 Mg Tabs (Pravastatin Sodium) .... Take One Tablet By Mouth Daily At Bedtime  Allergies (verified): No Known Drug Allergies  Vital Signs:  Patient  profile:   70 year old female Weight:      181 pounds O2 Sat:      95 % on 2 L/min pulsed Temp:     98.2 degrees F oral Pulse rate:   104 / minute BP sitting:   132 / 76  (left arm)  Vitals Entered By: Vernie Murders (September 02, 2009 1:50 PM)  O2 Flow:  2 L/min pulsed  Physical Exam  General:  obese, elderly woman, NAD Head:  normocephalic and atraumatic Nose:  no deformity, discharge, inflammation, or lesions Mouth:  erythema post pharynx Neck:  no stridor Lungs:  distant, no wheeze Heart:  regular rate and rhythm, S1, S2 without murmurs, rubs, gallops, or clicks Abdomen:  not  examined Msk:  no deformity or scoliosis noted with normal posture Extremities:  trace edema Neurologic:  non-focal Skin:  intact without lesions or rashes Psych:  alert and cooperative; normal mood and affect; normal attention span and concentration   Impression & Recommendations:  Problem # 1:  C O P D (ICD-496) Same BDs and O2 Pneumovax has been done after age 30 (doesn't need any more)  Flu shot every year  Problem # 2:  PULMONARY NODULE (ICD-518.89)  Stable x 2 yrs  Orders: Est. Patient Level III (16010)  Medications Added to Medication List This Visit: 1)  Ativan 1 Mg Tabs (Lorazepam) .Morgan Hays.. 1 two times a day  Patient Instructions: 1)  Continue your oxygen as you are using it 2)  Continue your Symbicort and Spiriva 3)  Follow up with Dr Delton Coombes in 4 months or as needed  4)  Your pneumovax is up to date. You will need the flu shot next Fall.

## 2010-08-03 NOTE — Progress Notes (Signed)
Summary: diagnosis code for albuterol nebs  Phone Note From Pharmacy Call back at [phone]   Caller: Patient Caller: [facility]* Call For: byrum  Summary of Call: need diagnosis code for albuterol Initial call taken by: Rickard Patience,  April 27, 2010 9:02 AM  Follow-up for Phone Call        Diagnosis code is 496 (COPD). Pharmacy is aware.Michel Bickers [facility]  April 27, 2010 9:26 AM

## 2010-08-04 ENCOUNTER — Ambulatory Visit (INDEPENDENT_AMBULATORY_CARE_PROVIDER_SITE_OTHER): Payer: Medicare Other | Admitting: Pulmonary Disease

## 2010-08-04 ENCOUNTER — Ambulatory Visit: Payer: Self-pay | Admitting: Adult Health

## 2010-08-04 ENCOUNTER — Encounter: Payer: Self-pay | Admitting: Pulmonary Disease

## 2010-08-04 DIAGNOSIS — J441 Chronic obstructive pulmonary disease with (acute) exacerbation: Secondary | ICD-10-CM | POA: Insufficient documentation

## 2010-08-04 DIAGNOSIS — J9610 Chronic respiratory failure, unspecified whether with hypoxia or hypercapnia: Secondary | ICD-10-CM | POA: Insufficient documentation

## 2010-08-19 NOTE — Assessment & Plan Note (Signed)
Summary: acute sick visit for copd exac., desat with ambulation   Vital Signs:  Patient profile:   70 year old female Height:      64 inches Weight:      177.38 pounds BMI:     30.56 O2 Sat:      82 % on 3 L/min pulsed Temp:     97.5 degrees F oral Pulse rate:   130 / minute BP sitting:   110 / 62  (left arm) Cuff size:   regular  Vitals Entered By: Carver Fila (August 04, 2010 10:39 AM)  O2 Flow:  3 L/min pulsed  Serial Vital Signs/Assessments:  Comments: 11:33 AM Ambulatory Pulse Oximetry  Resting; HR__105___    02 Sat___97% 3 LPM cont.__  Lap1 (185 feet)   HR___106__   02 Sat___97% 3 LPM cont. __ Lap2 (185 feet)   HR__110___   02 Sat__96% 3 LPM cont.___    Lap3 (185 feet)   HR___112__   02 Sat__96% 3 LPM cont.___  _X__Test Completed without Difficulty ___Test Stopped due to: Arman Filter LPN  August 04, 2010 11:34 AM    By: Arman Filter LPN   CC: Pt c/o increased sob, cough w/ green phlem, wheezing, chest tighness, numbness infeet x 2 weeks. Pt states her o2 has been staying in the 80's.  Comments 91% 3 LITERS PULSED AND HEART RATE 112.  meds and allergies udpated Phone number updated Carver Fila  August 04, 2010 10:39 AM    Primary Provider/Referring Provider:  [name], 5 Points  CC:  Pt c/o increased sob, cough w/ green phlem, wheezing, chest tighness, and numbness infeet x 2 weeks. Pt states her o2 has been staying in the 80's. [name]  History of Present Illness: the pt comes in for an acute sick visit.  She has known copd with chronic respiratory failure that is oxygen dependent.  She reports increasing sob with chest congestion, as well as cough with purulence starting 3 weeks ago.  She was seen by her primary md, and given a prednisone course and zpak.  She just finished the prednisone and feels much better.  Her congestion has resolved, and her mucus is clear at this time.  She was noted to have significant desaturation with ambulation despite her  supplemental oxygen, but she is on pulsed mode.  Her primary sent her for recent abg on 2lpm, with it showing 73/45/7.43 at rest.  Her primary complaint today is doe, but she feels she is improved with rest.  Current Medications (verified): 1)  Levoxyl 125 Mcg Tabs (Levothyroxine Sodium) .[name].. 1 By Mouth Daily 2)  Benicar Hct 20-12.5 Mg Tabs (Olmesartan Medoxomil-Hctz) .[name].. 1 Tab By Mouth Once Daily 3)  Adult Aspirin Ec Low Strength 81 Mg  Tbec (Aspirin) .... Take 1 Tablet By Mouth Once A Day 4)  Multivitamins   Tabs (Multiple Vitamin) .... Take 1 Tablet By Mouth Once A Day 5)  Calcium 500 500 Mg  Tabs (Calcium Carbonate) .... Take 1 Tablet By Mouth Two Times A Day 6)  Ativan 1 Mg  Tabs (Lorazepam) .[name].. 1 Two Times A Day 7)  Proventil Hfa 108 (90 Base) Mcg/act  Aers (Albuterol Sulfate) .... Inhale 2 Puffs Every 4 Hours As Needed 8)  Miralax   Powd (Polyethylene Glycol 3350) .... As Needed 9)  Symbicort 160-4.5 Mcg/act  Aero (Budesonide-Formoterol Fumarate) .... Inhale 2 Puffs Two Times A Day 10)  Flonase 50 Mcg/act Susp (Fluticasone Propionate) .... Take 2 Sprays  in Each Nostril Once A Day 11)  Albuterol Sulfate (2.5 Mg/82ml) 0.083% Nebu (Albuterol Sulfate) .[name].. 1 Vial Two Times A Day 12)  Loratadine 10 Mg Tabs (Loratadine) .[name].. 1 By Mouth Once Daily 13)  Spiriva Handihaler 18 Mcg Caps (Tiotropium Bromide Monohydrate) .... As Directed 14)  Oxygen .... As Directed 15)  Pravastatin Sodium 40 Mg Tabs (Pravastatin Sodium) .... Take One Tablet By Mouth Daily At Bedtime 16)  Vitamin D 1000 Unit Tabs (Cholecalciferol) .[name].. 1 By Mouth Daily 17)  Sertraline Hcl 50 Mg Tabs (Sertraline Hcl) .[name].. 1 Tab By Mouth Two Times A Day 18)  Trazodone Hcl 50 Mg Tabs (Trazodone Hcl) .[name].. 1 Tab By Mouth At Bedtime  Allergies (verified): No Known Drug Allergies  Past History:  Past medical, surgical, family and social histories (including risk factors) reviewed, and no changes noted (except as noted below).  Past  Medical History: Reviewed history from 05/31/2010 and no changes required. 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 Appendectomy-1956 Hysterectomy-1975 Tubal ligation-1972  History of benign left breast cyst status post excision.   Family History: Reviewed history from 05/07/2009 and no changes required. Significant for coronary artery disease in her father,   son, grandfather, and mother.      Social History: Reviewed history from 05/07/2009 and no changes required.  The patient is married and lives with her husband.  She   works as an Public house manager.  She has not had any known TB exposures.  She is a   former smoker and quit in 2001.  She has approximately 50 pack year   total history.  She does not use alcohol.      Review of Systems       The patient complains of shortness of breath with activity, productive cough, sore throat, and nasal congestion/difficulty breathing through nose.  The patient denies shortness of breath at rest, non-productive cough, coughing up blood, chest pain, irregular heartbeats, acid heartburn, indigestion, loss of appetite, weight change, abdominal pain, difficulty swallowing, tooth/dental problems, headaches, sneezing, itching, ear ache, anxiety, depression, hand/feet swelling, joint stiffness or pain, rash, change in color of mucus, and fever.    Physical Exam  General:  ow female in nad Nose:  no purulence or discharge seen Mouth:  clear Lungs:  decreased bs, but adequate airflow no wheezing Heart:  minimal tachy, regular Extremities:  slight ankle edema, no cyanosis  Neurologic:  alert and oriented, moves all 4.   Impression &  Recommendations:  Problem # 1:  CHRONIC OBSTRUCTIVE PULMONARY DISEASE, ACUTE EXACERBATION (ICD-491.21) the pt is getting over her recent acute exacerbation, treated with abx and prednisone.  She is no longer congested, and mucus is now clear.  She is better at rest,  but still has doe that I suspect is due to pulsed oxygen flow. Her sats are much improved on 3lpm continuous with exertion here in the office today.  I have asked her to stay on her current medical regimen, and will get with her dme to change her oxygen over to a different system to allow 3lpm continuous.  She is to keep her upcoming apptm with Dr. Delton Coombes, but call us if not doing well.  Medications Added to Medication List This Visit: 1)  Albuterol Sulfate (2.5 Mg/76ml) 0.083% Nebu (Albuterol sulfate) .Marland KitchenMarland KitchenMarland Kitchen  1 vial two times a day  Other Orders: Est. Patient Level IV (16109) DME Referral (DME) Pulse Oximetry, Ambulatory (60454)  Patient Instructions: 1)  will arrange for an oxygen system that will allow you to go to 3 liters continuous with exertion.  Can stay on pulsed when at rest. 2)  keep followup with Dr. Delton Coombes, but call if you feel you are not doing well.    Orders Added: 1)  Est. Patient Level IV [09811] 2)  DME Referral [DME] 3)  Pulse Oximetry, Ambulatory [91478]

## 2010-09-06 ENCOUNTER — Ambulatory Visit (INDEPENDENT_AMBULATORY_CARE_PROVIDER_SITE_OTHER): Payer: Medicare Other | Admitting: Emergency Medicine

## 2010-09-06 ENCOUNTER — Encounter: Payer: Self-pay | Admitting: Emergency Medicine

## 2010-09-06 DIAGNOSIS — J309 Allergic rhinitis, unspecified: Secondary | ICD-10-CM

## 2010-09-06 DIAGNOSIS — J449 Chronic obstructive pulmonary disease, unspecified: Secondary | ICD-10-CM

## 2010-09-14 NOTE — Assessment & Plan Note (Signed)
Summary: COPD   Visit Type:  Follow-up Primary Provider/Referring Provider:  [name], 5 Points  CC:  COPD.  Patient c/o increased SOB w/ exertion.[name]  History of Present Illness: [name] is a 70 year old woman who follows up today for her COPD, dyspnea, and for a history of a RLL pulmonary nodule. 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 No sgy has been planned due to her significant lung disease.  Also with hx depression.   ROV 09/02/09 -- Regular f/u visit. No exacerbations since last visit. Able to do her usual activities, sometimes has to stop to rest. No cough. Ocas wheeze, responds to Proventil. Occas neb use also. Tells me that she had Pneumovax in 04/2009.   ROV 01/11/10 -- regular f/u for her COPD, cough. Tells me that about 3 weeks ago noticed some worsening DOE, some increased cough. Unfortunately since our last visit her husband was dx with colon CA. The humidity and heat are bothering her breathing. Cough is non-productive. having a lot of PND. Takes loratadine and fluticasone. Takes her BD's reliably.   ROV 05/20/10 -- COPD, cough, allergies  the pt comes in for an acute sick visit.  She has known copd with chronic respiratory failure that is oxygen dependent.  She reports increasing sob with chest congestion, as well as cough with purulence starting 3 weeks ago.  She was seen by her primary md, and given a prednisone course and zpak.  She just finished the prednisone and feels much better.  Her congestion has resolved, and her mucus is clear at this time.  She was noted to have significant desaturation with ambulation despite her supplemental oxygen, but she is on pulsed mode.  Her primary sent her for recent abg on 2lpm, with it showing 73/45/7.43 at rest.  Her primary complaint today is doe, but she feels she is improved with rest.  ROV 09/06/10 -- COPD, seen 08/04/10 by [facility] for an acute flare after a URI. She is feeling better, back close to baseline. She still has  exertional SOB, has to stop to rest with chores. Last time O2 was increased to 3L/min at all times, finished azithro + Pred. Wheezing better. Taking Spiriva and Symbicort.   Preventive Screening-Counseling & Management  Alcohol-Tobacco     Smoking Status: quit     Packs/Day: 1.0     Year Started: 1986     Year Quit: 2001  Current Medications (verified): 1)  Levoxyl 125 Mcg Tabs (Levothyroxine Sodium) .[name].. 1 By Mouth Daily 2)  Benicar Hct 20-12.5 Mg Tabs (Olmesartan Medoxomil-Hctz) .[name].. 1 Tab By Mouth Once Daily 3)  Adult Aspirin Ec Low Strength 81 Mg  Tbec (Aspirin) .... Take 1 Tablet By Mouth Once A Day 4)  Multivitamins   Tabs (Multiple Vitamin) .... Take 1 Tablet By Mouth Once A Day 5)  Calcium 500 500 Mg  Tabs (Calcium Carbonate) .... Take 1 Tablet By Mouth Two Times A Day 6)  Ativan 1 Mg  Tabs (Lorazepam) .[name].. 1 Two Times A Day 7)  Proventil Hfa 108 (90 Base) Mcg/act  Aers (Albuterol Sulfate) .... Inhale 2 Puffs Every 4 Hours As Needed 8)  Miralax   Powd (Polyethylene Glycol 3350) .... As Needed 9)  Symbicort 160-4.5 Mcg/act  Aero (Budesonide-Formoterol Fumarate) .... Inhale 2 Puffs Two Times A Day 10)  Flonase 50 Mcg/act Susp (Fluticasone Propionate) .... Take 2 Sprays in Each Nostril Once A Day 11)  Albuterol Sulfate (2.5 Mg/17ml) 0.083% Nebu (Albuterol Sulfate) .Marland KitchenMarland KitchenMarland Kitchen 1  Vial Two Times A Day 12)  Loratadine 10 Mg Tabs (Loratadine) .[name].. 1 By Mouth Once Daily 13)  Spiriva Handihaler 18 Mcg Caps (Tiotropium Bromide Monohydrate) .... As Directed 14)  Oxygen .... As Directed 15)  Pravastatin Sodium 40 Mg Tabs (Pravastatin Sodium) .... Take One Tablet By Mouth Daily At Bedtime 16)  Vitamin D 1000 Unit Tabs (Cholecalciferol) .[name].. 1 By Mouth Daily 17)  Sertraline Hcl 50 Mg Tabs (Sertraline Hcl) .[name].. 1 Tab By Mouth Two Times A Day 18)  Trazodone Hcl 50 Mg Tabs (Trazodone Hcl) .[name].. 1 Tab By Mouth At Bedtime  Allergies (verified): No Known Drug Allergies  Social History: Packs/Day:   1.0  Vital Signs:  Patient profile:   70 year old female Height:      64 inches (162.56 cm) Weight:      187.13 pounds (85.06 kg) BMI:     32.24 O2 Sat:      92 % on 3 L/min Temp:     97.9 degrees F (36.61 degrees C) oral Pulse rate:   112 / minute BP sitting:   124 / 70  (left arm) Cuff size:   regular  Vitals Entered By: Michel Bickers CMA (September 06, 2010 1:28 PM)  O2 Sat at Rest %:  92 O2 Flow:  3 L/min  Serial Vital Signs/Assessments:  Comments: 2:03 PM Patients sats 95% on room air at rest. Patients sats 87% while ambulating on room air. Patient sats 94% on 2L whiie ambulating. By: Michel Bickers CMA   CC: COPD.  Patient c/o increased SOB w/ exertion. Comments Medications reviewed with patient Michel Bickers CMA  September 06, 2010 1:41 PM   Physical Exam  General:  ow female in nad Head:  normocephalic and atraumatic Nose:  no purulence or discharge seen Mouth:  clear Neck:  no stridor Lungs:  decreased bs, but adequate airflow no wheezing Heart:  minimal tachy, regular Abdomen:  not examined Msk:  no deformity or scoliosis noted with normal posture Extremities:  slight ankle edema, no cyanosis  Neurologic:  alert and oriented, moves all 4. Skin:  intact without lesions or rashes Psych:  alert and cooperative; appropriately sad after death of her husband, normal attention span and concentration   Impression & Recommendations:  Problem # 1:  C O P D (ICD-496) Recent AE, now back to baseline although this is with exertional limitations.  - same BDs - walking oximetry on RA and then titrate O2 - rov 3 mo  Problem # 2:  PULMONARY NODULE (ICD-518.89) Stable over 2 yrs  Problem # 3:  ALLERGIC RHINITIS (ICD-477.9)  Her updated medication list for this problem includes:    Flonase 50 Mcg/act Susp (Fluticasone propionate) .[name]... Take 2 sprays in each nostril once a day    Loratadine 10 Mg Tabs (Loratadine) .[name]... 1 by mouth once daily  Orders: Est. Patient Level IV  (16109)  Patient Instructions: 1)  Please use O2 at 2L/min with all exertion 2)  Continue your Spiriva and Symbicort as you are taking them 3)  Follow up with Dr Delton Coombes in 3 months or as needed    Immunization History:  Pneumovax Immunization History:    Pneumovax:  historical (02/27/2009)

## 2010-10-08 ENCOUNTER — Other Ambulatory Visit: Payer: Self-pay | Admitting: Cardiothoracic Surgery

## 2010-10-08 DIAGNOSIS — I77810 Thoracic aortic ectasia: Secondary | ICD-10-CM

## 2010-11-11 ENCOUNTER — Other Ambulatory Visit: Payer: Medicare Other

## 2010-11-11 ENCOUNTER — Ambulatory Visit (INDEPENDENT_AMBULATORY_CARE_PROVIDER_SITE_OTHER): Payer: Medicare Other | Admitting: Cardiothoracic Surgery

## 2010-11-11 DIAGNOSIS — I712 Thoracic aortic aneurysm, without rupture: Secondary | ICD-10-CM

## 2010-11-13 NOTE — Assessment & Plan Note (Signed)
OFFICE VISIT  Morgan Hays, Morgan Hays DOB:  08/14/1940                                        Nov 12, 2010 CHART #:  [id number]  The patient returns to the office with a known dilated ascending aorta, which we have been following since she was initially seen in November 2007 by Dr. Cornelius Moras at which time her ascending aorta was approximately 4.9- 5 cm just above the aortic root and a descending thoracic aorta of 3.3 cm.  The patient has severe underlying pulmonary disease.  She had a return appointment for yearly followup to get a repeat CT.  She was seen in the emergency room at [facility] several days ago with exacerbation of her COPD and diagnosed with bronchopneumonia.  A CT scan was done at that time to rule out pulmonary embolus, so we did not repeat the CT scan today.  She continues to have worsening pulmonary disease, remains on oxygen constantly, has very very limited ability because of shortness of breath with frequent exacerbations of pneumonia and COPD.  She is currently on a Z-Pak and prednisone Dosepak.  Her medication list is reviewed.  She continues on the prednisone and Z- Pak also on Levoxyl, Benicar, trazodone, sertraline 50 b.i.d., multivitamins, calcium, aspirin, lorazepam, Proventil, oxygen, Symbicort, fluconazole, pravastatin, loratadine, Spirea, magnesium.  FOLLOWUP:  She notes she does have a living will on fall with the register of deeds in [place] with advanced directives.  PHYSICAL EXAMINATION:  GENERAL:  In the exam room, the patient is short of breath at rest spanning herself.  She is dyspneic at rest.  VITAL SIGNS:  Her blood pressure is 161/87, pulse is 107, respiratory rate 20 on 3 liters of O2 sats 96%.  NECK:  She has no carotid bruits.  LUNGS: She has distant breath sounds bilaterally, but with no active wheezing today.  CARDIAC:  I do not appreciate any murmur, aortic stenosis, or aortic insufficiency.  ABDOMEN:  Benign.   EXTREMITIES:  She has no calf tenderness.  She has equal and full pulses in brachial, radial, femoral, and pedal.  The patient's CT scan is reviewed.  Her ascending aorta has grown slightly since last year, still in the range of 5.4.  The patient's aorta is now at level that in a lower risk.  The patient would be recommended to proceed with elective replacement of her ascending aorta.  However, each time she has been seen, she appears to have very significant pulmonary disease and although she is at risk of acute aortic dissection, the elective repair of her ascending aorta to prevent dissection or rupture would carry very significant risk.  I have discussed these findings with her and at this point, she is not interested in major cardiac surgical intervention judging her operative risk and findings, reluctant to recommend elective repair to her.  We will plan to see her back in 6 months.  She also sees Dr. Delton Coombes and Dr. Jens Som, has appointments to see them coming up, look forward to their opinions also.  Sheliah Plane, MD Electronically Signed  EG/MEDQ  D:  11/12/2010  T:  11/13/2010  Job:  [id number]  cc:   Leslye Peer, MD Madolyn Frieze. Jens Som, MD, [facility] Luretha Murphy. [place], [place]

## 2010-11-16 NOTE — Discharge Summary (Signed)
Morgan Hays, Morgan Hays NO.:  [id number]   MEDICAL RECORD NO.:  [id number]          PATIENT TYPE:  OIB   LOCATION:  2028                         FACILITY:  [facility]   PHYSICIAN:  Morgan Canning. [name], MD    DATE OF BIRTH:  06-06-1941   DATE OF ADMISSION:  12/05/2006  DATE OF DISCHARGE:  12/05/2006                               DISCHARGE SUMMARY   FINAL DIAGNOSES:  1. Symptomatic supraventricular tachycardia with weakness palpitation      mild dyspnea, mild dizziness and mild chest discomfort.  2. Electrophysiology study December 05, 2006, with radiofrequency catheter      ablation at [facility] triangle with slow P-wave modification of an      AVNRT, Dr. Lewayne Bunting.  The patient had no postprocedural      complications, discharging December 05, 2006.   SECONDARY DIAGNOSES:  1. Chronic obstructive pulmonary disease/emphysema/history of tobacco      habituation.      a.     Chest x-ray shows 4 mm subpleural nodule at the superior       segment of the right lower lobe adjacent to the minor fissure,       follow-up CT in 6 months.  2. Hypertension.  3. Mitral valve prolapse.  4. Myoview study September 06, 2006, ejection fraction is 68 greater than      65% no scar no ischemia.  5. His Duplex ultrasound of the abdominal aorta is normal caliber.  6. 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   PROCEDURE:  December 05, 2006, electrophysiology study, radiofrequency  catheter ablation of AVNRT for symptomatic supraventricular tachycardia  with breakthrough despite medical therapy of verapamil and digoxin, Dr.  Lewayne Bunting.   BRIEF HISTORY:  Morgan Hays is 70 year old female.  She has a history of  tachy palpitation which dates back to 76.  Her episodes of  tachyarrhythmia have been variable in nature.  In April of this year,  she had a recurrence of her SVT which was sustained.  It did not  terminate spontaneously.  She presented to the emergency room.   She was  found to have a rhythm 205 beats per minute.  This was associated with  dyspnea and presyncope.  She had no frank syncope, however.  The patient  was treated with adenosine and the dysrhythmia was terminated.   ADDITIONAL PAST MEDICAL HISTORY:  1. Ascending thoracic aneurysm as well as, to a lesser extent,      descending thoracic aneurysm.  The abdominal aorta is normal in      caliber.  2. The patient also has a history of hypertension.  3. She is status post tonsillectomy and appendectomy.  4. She has a history of benign breast tumor status post lumpectomy.  5. She also claims that verapamil causes fatigue and she would like to      pursue options for ablation so that she could come off both her      verapamil and digoxin.   Of note, following ablation of  the SVT, the patient will be referred to  cardiovascular thoracic surgeons for an initial evaluation. It is not  certain that she would meet the criteria to proceed with repair of her  ascending thoracic aneurysm but it would be good to have them involved  and to monitor the patient with [place].   HOSPITAL COURSE:  The patient presented electively December 05, 2006, she  underwent electrophysiology study.  No inducible atrial tachycardia and  no lateral accessory pathway.  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  This anatomical  feature was small on this patient.  The patient has been watched in the  electrophysiology lab and has had no recurrence of SVT.   The patient will discharge on the following medicines.  She is to stop  digoxin.  Stop Verapamil.  1. Lisinopril/hydrochlorothiazide 20/25 one and a half tablets daily.  2. Enteric-coated aspirin 81 mg daily.  3. Advair 250/50 one puff daily.  4. Ativan 0.5 mg daily as needed.  5. Levoxyl 150 mcg daily.  6. Prozac 40 mg daily.   She follows up with [facility], [address],  [place], [place]  [place].  1. To see Dr. [name] Thursday January 04, 2007, at 3:45.  She will      probably have referral to the CVTS surgeons at that time.  2. Computed tomogram of the chest Monday, May 07, 2007, 8 o'clock      in the morning.  She is asked to eat nothing 2 hours per prior to      the study.  This is to reassess the 4 mm subdural pleural nodule at      the superior segment of the right lower lobe which was imaged on      the computed tomogram Nov 13, 2006.      Morgan Mirza, PA      Morgan Canning. [name], MD  Electronically Signed    GM/MEDQ  D:  12/05/2006  T:  12/05/2006  Job:  [id number]   cc:   Madolyn Frieze. Jens Som, MD, [facility]  Royce Macadamia, P.A.

## 2010-11-16 NOTE — Assessment & Plan Note (Signed)
[facility]                             PULMONARY OFFICE NOTE   Morgan Hays, Morgan Hays                          MRN:          [id number]  DATE:05/02/2007                            DOB:          [birth year]    SUBJECTIVE:  Morgan Hays is a 70 year old woman who follows up today for  her COPD, dyspnea, and for a history of a pulmonary nodule.  I saw her  initially on October 1.  At that time, we decided to initiate a trial of  Spiriva.  She has been taking the Spiriva reliably.  She tells me that  she is having good days and bad days.  The bad days are characterized  by shortness of breath with exertion, particularly when she has to rush  around at her job.  She has some shortness of breath with a lot of  walking or with carrying heavy objects.  She says that her breathing and  her wheezing may both be a little bit better since the Spiriva was added  to her regimen.  She is also on Advair 250/50 1 inhalation b.i.d.  She  is not using frequent supplemental albuterol.   CURRENT MEDICATIONS:  1. Multivitamin once daily.  2. Aspirin 81 mg daily.  3. Advair 250/50 1 inhalation b.i.d.  4. Ativan 0.5 mg p.r.n.  5. Prozac 40 mg daily.  6. Calcium 500 mg in the morning and in the evening.  7. Lisinopril/HCTZ 20/25 mg once daily.  8. Levoxyl 125 mcg daily.  9. Spiriva 1 inhalation daily.  10.Proventil 2 puffs q.4h. p.r.n. shortness of breath.  11.Lorazepam p.r.n.  12.Tramadol p.r.n.  13.Flexeril p.r.n.   PHYSICAL EXAMINATION:  GENERAL:  This pleasant, well-appearing woman is  in no distress.  Her weight is 171 pounds.  Temperature 98.1, blood pressure 142/70.  Heart rate is 97.  SpO2 is 96% on room air.  At her last visit, she  ambulated without difficulty and did not desaturate on room air.  HEENT:  Benign.  LUNGS:  Clear to auscultation bilaterally.  HEART:  Regular without a murmur.  ABDOMEN:  Obese, soft and nontender with positive bowel sounds.  EXTREMITIES:  No clubbing, cyanosis or edema.   IMPRESSION:  1. Chronic obstructive pulmonary disease with exertional dyspnea,      slightly improved with the addition of Spiriva.  2. Right lower lobe pulmonary nodule.   PLAN:  1. I will continue her Spiriva and her Advair, as ordered.  She will      also use Proventil on an as-needed basis.  She did not desaturate,      so I will not start oxygen.  2. A CT scan of the chest is already planned for November, 2008 to      follow her thoracic aneurysm and also her right lower lobe      pulmonary nodule.  I will review the study when it becomes      available.  We will discuss follow-up scans at that time.  3. I will follow up with [name] in four months or  sooner if she      has any difficulty in the interim.     Leslye Peer, MD  Electronically Signed    RSB/MedQ  DD: 05/02/2007  DT: 05/03/2007  Job #: [id number]   cc:   [name], PA  Madolyn Frieze. Jens Som, MD, [facility]  [name]. [name], MD  Sheliah Plane, MD

## 2010-11-16 NOTE — Assessment & Plan Note (Signed)
[facility]                         ELECTROPHYSIOLOGY OFFICE NOTE   Morgan Hays                          MRN:          [id number]  DATE:11/29/2006                            DOB:          1940/10/09    Morgan Hays is referred today by Dr. Olga Millers for consultation and  evaluation and treatment of SVT.   HISTORY OF PRESENT ILLNESS:  The patient is a 70 year old woman, who has  a history of tachy-palpitations dating back to 43.  She works as a  Engineer, civil (consulting).  Her health has otherwise been fairly good.  She states that her  episodes of SVT have been quite variable in nature.  She has been  maintained on calcium channel blockers with fairly good result but back  in April, she had a recurrent episode of SVT which was sustained and did  not terminate spontaneously.  She subsequently went to the emergency  department where she was found to be in SVT at 205 beats per minute.  This was associated with shortness of breath and near-syncope.  She had  no frank syncope, however.  The patient was treated with adenosine.  Her  SVT terminated.  Additional past medical history is notable for  abdominal aortic aneurysm involving both the ascending as well as the  descending aorta.  She has a history of hypertension.  She has a history  of tonsillectomy and appendectomy.  She has a history of benign breast  tumor status post lumpectomy.   MEDICATIONS:  1. Verapamil 360 mg daily.  2. Digoxin 0.125 mg daily.  3. Levoxyl.  4. Prozac.  5. Lisinopril/HCTZ.  6. Calcium.  7. Aspirin.  8. Lorazepam.  9. She also takes Advair.   SOCIAL HISTORY:  The patient is married.  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  The patient denies tobacco abuse, though she  stopped smoking in 2001.  She denies alcohol abuse.   REVIEW OF SYSTEMS:  Notable for dyspnea on exertion and occasional  episodes of near-syncope and lightheadedness associated with her SVT.  She has frequent headaches.  She has rare episodes of chest pressure  with her exertion.  Additional past medical history of note is the  finding of a thoracic ascending aortic aneurysm measuring 5.4 x 5 cm.  She also has a descending thoracic aneurysm measuring 3 cm as well as  one measuring 4 cm.  The rest of her review of systems was negative.   PHYSICAL EXAMINATION:  GENERAL:  The patient is a pleasant, well-  appearing middle-age woman in no acute distress.  VITAL SIGNS:  The blood pressure today was 96/58.  The pulse was 96 and  irregular.  Respirations were 18.  The weight was 152 pounds.  HEENT:  Normocephalic, atraumatic.  Pupils equal and round.  The  oropharynx is moist, the sclerae anicteric.  NECK:  No jugular venous distention, no thyromegaly.  Trachea was  midline.  The carotids are 2+ and symmetric.  LUNGS:  Clear bilaterally to auscultation.  No wheezes, rales, or  rhonchi were present.  There is no increased work of breathing.  CARDIOVASCULAR:  Regular rate and rhythm with normal S1 and S2, but I  did not appreciate an S3 or S4 gallop.  The PMI was not enlarged nor was  it laterally displaced.  ABDOMEN:  Soft, nontender, nondistended with no organomegaly.  The bowel  sounds are present.  There is no rebound or guarding.  EXTREMITIES:  No cyanosis, clubbing, or edema.  The pulses were 2+ and  symmetric.  NEUROLOGIC:  Alert and oriented x3.  Cranial nerves intact.  The  strength was 5/5 and symmetric.  SKIN:  Normal.  JOINTS:  Normal.   EKG demonstrates sinus rhythm with no evidence of ventricular  preexcitation.   IMPRESSION:  1. Symptomatic supraventricular tachycardia.  2. Thoracic aneurysm.   DISCUSSION:  I have discussed treatment options with the patient, the  risks, benefits, goals, and expectations of electrophysiologic study and  catheter ablation have been discussed with the patient,  and she wishes  to proceed.  This has been scheduled as early as possible at a  convenient time.  Following this, we will plan on referral to the  cardiac surgeons for initial evaluation.  I am not sure she meets the  criteria requires that she proceed with aneurysm surgery, though we  would like to have them involved and monitor the patient with [place].     [name]. [name], MD  Electronically Signed    GWT/MedQ  DD: 11/29/2006  DT: 11/29/2006  Job #: [id number]   cc:   Dr. Sidney Ace. Jens Som, MD, [facility]

## 2010-11-16 NOTE — Assessment & Plan Note (Signed)
[facility]                            CARDIOLOGY OFFICE NOTE   Morgan Hays, Morgan Hays                          MRN:          [id number]  DATE:09/01/2008                            DOB:          02-Jun-1941    Morgan Hays is a pleasant female with a thoracic aneurysm, severe COPD,  and paroxysmal atrial tachycardia.  When I last saw her on September 17, 2007, we schedule her to have a Myoview, which performed on September 27, 2007.  Her LV function was normal with an estimated ejection fraction of  78%.  There was normal perfusion.  She also had an echocardiogram on  May 07, 2007, that showed normal LV function, aortic root  dilatation, and trivial mitral regurgitation.  Since I last saw her, she  has done reasonably well.  She did have a problem on August 08, 2008,  when she tried to commit suicide.  However, she was seen in [facility] and ultimately seen at a mental institution and she is much  improved and has no further interest in harming herself.  Note, she does  have dyspnea on exertion, but is now on home oxygen.  There is no  orthopnea or PND, but there can be a mild pedal edema.  There is no  chest pain, palpitations, or syncope.   MEDICATIONS:  1. Lisinopril HCT 20/25 mg tablets 1 p.o. daily.  2. Multivitamin.  3. Calcium.  4. Aspirin.  5. Levoxyl 137 mcg p.o. daily.  6. Flonase.  7. Home oxygen.  8. Symbicort.  9. __________.   PHYSICAL EXAMINATION:  VITAL SIGNS:  Today shows a blood pressure of  126/74 and her pulse is 114.  She weighs 184 pounds.  HEENT:  Normal.  NECK:  Supple.  CHEST:  Clear.  CARDIOVASCULAR:  Tachycardic rate with regular rhythm.  ABDOMEN:  No tenderness.  EXTREMITIES:  Trace edema.   Her electrocardiogram shows a sinus tachycardia at a rate of 102.  There  is no RV conduction delay.  There are no ST changes noted.   DIAGNOSES:  1. Thoracic aortic aneurysm - the patient did have a followup CT  performed on June 12, 2008, ordered by Dr. Delton Coombes.  The aortic      aneurysm measured 5.2 x 5 cm.  She also was noted to have      emphysema.  She will follow up with Dr. Tyrone Sage in May concerning      this issue.  Given her severe chronic obstructive pulmonary      disease, she may not be a candidate for aortic root replacement and      if necessary in the future.  2. Chronic obstructive pulmonary disease.  3. History of lung nodule - she is following up with Dr. Delton Coombes now for      this issue.  4. History of supraventricular tachycardia, status post ablation.  5. History mitral valve prolapse.  6. Hypertension - her blood pressure is adequately controlled on      present medications.  Her primary care physician in [place]  following her renal function.  7. History of anxiety/depression.   We will see her back in 12 months or sooner if necessary.     Madolyn Frieze Jens Som, MD, [facility]  Electronically Signed    BSC/MedQ  DD: 09/01/2008  DT: 09/02/2008  Job #: [id number]

## 2010-11-16 NOTE — Assessment & Plan Note (Signed)
OFFICE VISIT   Morgan Hays, Morgan Hays  DOB:  [birth year]                                        August 23, 2007  CHART #:  [id number]   Patient was originally seen by me in 11/07 for dilatation of ascending  aorta.  She had a history of CT scans done in 11/07 and 05/08.  She had  been seeing Dr. [name] for episodes of paroxysmal supraventricular  tachycardia.  She had a follow-up CT scan in 11/08, and my measurement  seemed unchanged.  The report has a maximum diameter of 5.6.  The mid  ascending aorta appears to be about 5.3 and unchanged from previous  scans.  The patient missed her appointment to see me in December but now  presents for followup.  She has had no symptoms of chest pain.  She is a  previous smoker but quit in 2001.   PHYSICAL EXAMINATION:  Blood pressure 128/77, pulse 100, respiratory  rate 18, O2 sats 95%.  She had been placed on a beta blocker but notes  that she stopped this because of wheezing.  I do not hear any wheezing  at this time.  I do not appreciate any cervical or supraclavicular  adenopathy.  I do not hear any murmur of aortic insufficiency or aortic  stenosis.  She has no pedal edema.   The scans done in November are reviewed.  In addition to the question of  the ascending aorta size, the patient also had a small right lower lobe  nodule that on the scan in 11/08 was less prominent than the previously  done scans, probably indicating its benign nature, although further  follow-up scans would confirm this.   I have reviewed the findings with the patient, and we have decided to  repeat the CT scan approximately six months after the one that she had  in November, so in May or June, I will see  her back with a follow-up CT scan.  She is also followed by Dr. Jens Som  and is to see him next month.   Sheliah Plane, MD  Electronically Signed   EG/MEDQ  D:  08/23/2007  T:  08/24/2007  Job:  [id number]   cc:   Madolyn Frieze. Jens Som, MD,  [facility]

## 2010-11-16 NOTE — Assessment & Plan Note (Signed)
[facility]                            CARDIOLOGY OFFICE NOTE   GALAXY, BORDEN                          MRN:          [id number]  DATE:09/17/2007                            DOB:          [birth year]    Mrs. Morgan Hays is a pleasant female whom I have seen the past for a  thoracic aneurysm, dyspnea, and history of paroxysmal atrial  tachycardia.  Note, her previous workup has shown normal LV function.  She also has had a BNP when a saw her last that was normal at 15.  CPX  and pulmonary function tests have suggested that she has significant  COPD.  We have felt this is most likely causing her dyspnea.  She has  also seen Dr. Delton Hays now.  He placed on Spiriva and other bronchodilators  and her symptoms have improved somewhat.  She does continue to have  dyspnea on exertion.  There is no orthopnea or PND.  There is no pedal  edema.  She has not had syncope.  She occasionally feels chest  tightness.  This occurs with exertion and there is increased wheezing.  It is relieved with rest.  It does not radiate.  The pain is not  pleuritic or positional.  It is not related to food.   MEDICATIONS:  Include Levoxyl and 125 mcg daily, Prozac 20 mg daily,  lisinopril hydrochlorothiazide 20/25 daily, multivitamin, calcium, and  aspirin.   PHYSICAL EXAM:  Today shows a blood pressure 133/87 and pulse is 98.  She weighs 177 pounds.  HEENT:  Normal.  NECK:  Supple.  CHEST:  Clear.  CARDIOVASCULAR:  Regular rate and rhythm.  ABDOMEN:  Exam shows no tenderness.  EXTREMITIES:  Show no edema.   Electrocardiogram shows normal sinus rhythm at rate of 86.  There are no  ST changes noted.  There is an RV conduction delay.   DIAGNOSES:  1. Chest tightness - this may be related to her lung disease as there      is increased wheezing as well.  However, we will plan to proceed      with a Myoview for risk stratification.  If it shows no ischemia      then I think we will not  pursue further ischemia evaluation.  Note,      she would require cardiac catheterization if ever she is felt to      require aortic root replacement.  2. Dyspnea - we have felt that this is most likely related to COPD.  3. Thoracic aortic aneurysm - this is being followed by Dr. Tyrone Hays,      and a follow-up chest CT is planned in May.  4. History of lung nodule - followup chest CT is scheduled for May.  5. History of supraventricular tachycardia status post ablation.  6. History of mitral valve prolapse.  7. Hypertension - blood pressure is adequately controlled.  We will      check BMET.  8. History of anxiety/depression.  I will see her back in 12 months.     Morgan Frieze Crenshaw,  MD, [facility]  Electronically Signed    BSC/MedQ  DD: 09/17/2007  DT: 09/17/2007  Job #: [id number]   cc:   Morgan Plane, MD

## 2010-11-16 NOTE — Discharge Summary (Signed)
NAMESHARDEA, CWYNAR NO.:  [id number]   MEDICAL RECORD NO.:  [id number]          PATIENT TYPE:  OIB   LOCATION:  2028                         FACILITY:  [facility]   PHYSICIAN:  Maple Mirza, PA   DATE OF BIRTH:  1941-01-13   DATE OF ADMISSION:  12/05/2006  DATE OF DISCHARGE:                               DISCHARGE SUMMARY   Audio too short to transcribe (less than 5 seconds)      Maple Mirza, PA     GM/MEDQ  D:  12/05/2006  T:  12/05/2006  Job:  [id number]

## 2010-11-16 NOTE — Op Note (Signed)
Morgan Hays, Morgan Hays                 ACCOUNT NO.:  [id number]   MEDICAL RECORD NO.:  [id number]          PATIENT TYPE:  OIB   LOCATION:  2854                         FACILITY:  [facility]   PHYSICIAN:  [name]. 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   HISTORY OF PRESENT ILLNESS:  The patient is a very pleasant 70 year old  woman with a longstanding history of tachy palpitations and documented  SVT.  She has been on multiple medications but has continued to have  breakthrough episodes of SVT despite both Verapamil and digoxin.  The  patient complains of fatigue and tiredness on the Verapamil and is now  referred for electrophysiologic study and catheter ablation.   PROCEDURE:  After informed consent was obtained, the patient was taken  to the diagnostic EP lab in fasting state.  After the usual preparation,  a 5-French quadripolar catheter was inserted percutaneously into the  right femoral vein and advanced to the right ventricle.  5-French  quadripolar catheter was inserted percutaneously in the right femoral  vein and advanced the His bundle region.  A 6-French hexapolar catheter  was inserted percutaneously in the right jugular vein advanced coronary  sinus and a 7-French quadripolar catheter was inserted percutaneously in  the right femoral vein and advanced to the right atrium.  After  measurement basic intervals, rapid ventricular pacing was carried out  from the RV apex and stepwise decreased down to 450 milliseconds where  VA Wenckebach was observed.  During rapid ventricular pacing the atrial  activation was midline decremental.  Next programmed ventricular  stimulation was carried out from the RV apex at base drive cycle  length  of 161 milliseconds.  The S1-S2 interval stepwise decreased down to 460  milliseconds where retrograde AV node ERP was observed.  During  programmed ventricular stimulation the atrial activation was midline  decremental.  Next programmed atrial stimulation was carried out from  the coronary sinus and high right atrium base drive cycle length of 096,  and 600 milliseconds with the S1-S2 interval stepwise decreased down to  540 milliseconds where the AV node ERP was observed.  During probing  stimulation there were AH jumps and echo beats but no inducible SVT.  Rapid atrial pacing was then carried out from the high right atrium and  the coronary sinus and stepwise decreased down to 340 milliseconds where  AV Wenckebach was observed.  During rapid atrial pacing the PR interval  was equal but not greater than the RR interval there was no inducible  SVT.  At this point Isuprel was infused at a rate of up to 5 mcg per  minute and additional programmed atrial stimulation, rapid atrial pacing  was carried out with the S1-S2 interval stepwise decreased down to  atrial refractoriness.  During probing atrial stimulation on Isuprel  there was nonsustained SVT a cycle length of 367 milliseconds induced.  Because it was nonsustained we could not do any pacing maneuvers  however, the atrial activation was midline and the VA time was  essentially in tachycardia.  With the patient documented SVT up to 200  beats per minute with no inducible atrial tachycardia and with no  evidence of accessory pathway conduction, a diagnosis of AV node reentry  tachycardia was made and the 7-French quadripolar ablation catheter was  advanced into the region of Koch's triangle.  Mapping was carried out  which demonstrated very small Koch's triangle.  A single RF energy  application was delivered for 120 seconds.  During RF energy application  there was prolonged accelerated junctional rhythm.  Following this 40   minutes was allowed and the patient during this time underwent at  repeated attempts at inducing SVT on Isuprel which demonstrated no  inducible SVT.  There was initially no residual slow pathway conduction.  The catheters were then removed.  Hemostasis was assured, the patient  was returned to her room in satisfactory condition.   COMPLICATIONS:  No immediate procedure complications.   RESULTS:  A.  Baseline ECG.  Baseline ECG demonstrates sinus rhythm with  normal axis and intervals.  B.  Baseline intervals, sinus node cycle length was 93 milliseconds.  The HV interval was 43 milliseconds.  QRS duration 83 milliseconds.  C.  Rapid ventricular pacing.  Rapid ventricular pacing was carried out the  RV apex demonstrating a base drive cycle length of 478 milliseconds  stepwise decreased down to 450 milliseconds where VA Wenckebach was  observed.  During rapid ventricular pacing the atrial activation was  midline and decremental.  D.  Programmed ventricular stimulation.  Programmed ventricular  stimulation was carried out the RV apex at base drive cycle length of  295 milliseconds.  The S1-S2 interval stepwise decreased down to 460  milliseconds where retrograde AV node ERP was observed.  During  programmed ventricular stimulation the atrial activation was midline  decremental.  E.  Programmed atrial stimulation.  Programmed atrial stimulation was  carried out from the high right atrium to the coronary sinus at base  drive cycle length of 621 milliseconds.  The S1-S2 interval stepwise  decreased down to 460 milliseconds where the retrograde AV node ERP was  observed.  During programmed ventricular stimulation the atrial  activation was midline decremental.  F.  Rapid atrial pacing.  Rapid atrial pacing was carried out from the  coronary sinus and high right atrium base drive cycle length of 308 milliseconds.  Pacing interval was stepwise decreased down to 40  milliseconds where AV  Wenckebach was observed.  During rapid atrial  pacing the PR interval was less than the RR interval.  G.  Programmed atrial stimulation.  Programmed atrial stimulation was  carried out the coronary sinus and high right atrium base drive cycle  length of 657, 600, 500 and 400 milliseconds.  This was carried out with  and without Isuprel.  During probing stimulation there was nonsustained  SVT induced on Isuprel at cycle length of 367 milliseconds.  H.  Arrhythmias observed.  1. AV node reentry tachycardia, initiation was with programmed atrial      stimulation on Isuprel, duration was nonsustained, termination was      spontaneous and cycle length was 367 milliseconds.      a.     Mapping.  Mapping of Koch's triangle the straight unusually  small Koch's triangle.          I. RF energy application.  A single RF energy application was              delivered to Koch's triangle resulting in prolonged              accelerated junctional rhythm.  Following RF energy              application there was no evidence residual slow pathway              conduction.   CONCLUSION:  This study demonstrates successful electrophysiologic study  and RF catheter ablation of AV node reentry tachycardia (nonsustained)  with a single RF energy application delivered to site 6 in Koch's  triangle.  During RF energy application there was prolonged accelerated  junctional rhythm.      [name]. [name], MD  Electronically Signed     GWT/MEDQ  D:  12/05/2006  T:  12/05/2006  Job:  [id number]   cc:   Madolyn Frieze. Jens Som, MD, [facility]  Dr. Royce Macadamia

## 2010-11-16 NOTE — Assessment & Plan Note (Signed)
[facility]                             PULMONARY OFFICE NOTE   Morgan, Hays                          MRN:          [id number]  DATE:04/04/2007                            DOB:          [birth year]    PULMONARY CONSULTATION NOTE   REASON FOR CONSULTATION:  I was asked by Dr. Jens Som with cardiology to  evaluate Morgan Hays for dyspnea.   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  She has a history of hypertension and atrial arrhythmias, as  well as a stable thoracic aortic aneurysm.  She has been followed by Dr.  Olga Millers and also by Dr. Lewayne Bunting, [facility].  She  underwent catheter ablation in May of this year and has not had any  further problems with atrial arrhythmias.  As part of her evaluation for  her thoracic aortic aneurysm, a CT scan was performed that identified a  4 mm subpleural nodule in the superior segment of the right lower lobe.  Ms. Girgis tells me that, despite the treatment of her supraventricular  tachycardia, she has experienced progressive dyspnea over approximately  1 year.  This is most notable when she carries a heavy load or walks a  long distance.  She is relieved by resting.  Walking oxymetry has been  performed and she does not have any evidence of desaturations.  She has  had a Myoview study in March of 2008 that showed an ejection fraction of  68% without any evidence of ischemia.  She has been treated with Advair  for presumed airflow limitation by her physician's assistant and primary  care Gearl Kimbrough, [name] in [place].  She states that the Advair did  help her dyspnea to some degree, but she is not back to her baseline  prior to a year ago.  She does occasionally wheeze in the morning.  She  is not having any significant cough.  She does have a lot of nasal  drainage that has been clear.   REVIEW OF SYSTEMS:  Otherwise, significant for some chest  discomfort,  headache, anxiety, depression, and some joint stiffness and pain.   PAST MEDICAL HISTORY:  1. Mitral valve prolapse and atrial tachycardia with subsequent      supraventricular tachycardia and palpitations, treated in May of      2008 with catheter ablation by Dr. Lewayne Bunting.  2. Hypothyroidism.  3. Arthritis.  4. Hypertension.  5. Depression.  6. History of benign left breast cyst status post excision.  7. History of tobacco use and presumed COPD.  8. Varicose veins.  9. Status post hysterectomy in 1975.  10.Status post tubal ligation in 1972.  11.Status post appendectomy 1956.   ALLERGIES:  No known drug allergies.   SOCIAL HISTORY:  The patient is married and lives with her husband.  She  works as an Public house manager.  She has not had any known TB exposures.  She is a  former smoker and quit in 2001.  She has approximately 50 pack year  total  history.  She does not use alcohol.   FAMILY HISTORY:  Significant for coronary artery disease in her father,  son, grandfather, and mother.   REVIEW OF SYSTEMS:  As per the HPI.   EXAM:  GENERAL:  This is a well-appearing woman who is in no acute  distress on room air.  Her weight is 165 pounds, temperature 97.8, blood pressure 138/78, heart  rate 89, SpO2 95% on room air.  HEENT:  Oropharynx is benign.  She has some mild posterior pharyngeal  erythema.  I do not see any active post-nasal drip.  NECK:  Supple without lymphadenopathy or stridor.  LUNGS:  Distant, but she has no wheezing in a normal respiratory cycle  or on forced expiration.  HEART:  Regular without murmur.  ABDOMEN:  Obese, soft, and nontender with positive bowel sounds.  EXTREMITIES:  No cyanosis, clubbing, or edema.  NEUROLOGIC:  She interacts appropriately and has a nonfocal exam.   CT scan of the chest performed on Nov 13, 2006 shows evidence of  emphysematous changes superiorly.  There is a 4 mm subpleural nodule in  the superior segment of the right  lower lobe.  There is an ascending  thoracic aortic aneurysm with a maximum diameter of 5.4 by 5.0 cm at the  level of the main pulmonary trunk.  Pulmonary function testing performed  on January 04, 2007 showed evidence for severe airflow limitation with a  positive bronchodilator response.  Her total lung capacity was normal,  which in the absence of hyperinflation, was suggestive of restriction.  Her diffusion capacity was decreased.   IMPRESSION:  1. Chronic obstructive pulmonary disease with associated exertional      dyspnea.  2. Right lower lobe pulmonary nodule.   PLAN:  1. I would like to add Spiriva to her existing Advair.  2. I will perform walking oxymetry today and ensure that she does not      desaturate with ambulation.  She will use albuterol on as needed      basis.  3. She will need a CT scan of her chest in November of 2008 to follow      both her aneurysm and her pulmonary nodule.  This is already      scheduled.  4. I will follow up with Ms. Colasurdo in 6 weeks to review her progress      with the addition of her Spiriva.     Leslye Peer, MD  Electronically Signed    RSB/MedQ  DD: 04/06/2007  DT: 04/07/2007  Job #: [id number]   cc:   [name], PA-C  Madolyn Frieze Jens Som, MD, [facility]  [name]. [name], MD  Sheliah Plane, MD

## 2010-11-16 NOTE — Assessment & Plan Note (Signed)
OFFICE VISIT   Morgan Hays, Morgan Hays  DOB:  [birth year]                                        Nov 08, 2007  CHART #:  [id number]   The patient returns see me with a repeat CT scan of her chest to  evaluate the size of her ascending aorta.  The patient was originally  seen by me in November of 2007 for a dilated ascending aorta.  She had a  history of CT scans done in 2007 and 2008 and then was seen by Dr.  [name] for paroxysmal supraventricular tachycardia.  Follow-up CT scan  in November of 2008 appeared unchanged from previous scans.  At the time  I saw her in February of this year we decided to repeat her scan.  It  would be approximately 6 months from the one that she had in November of  2008.  She said she is a previous smoker, quit smoking in 2001 but she  has known significant COPD and has been evaluated by [facility]  pulmonologist.  She does note that with much exertion at all she gets  short of breath limiting her overall functional ability.  She notes that  pulmonary function studies have been done in the [place] office although  I do not have information on this.   PHYSICAL EXAMINATION:  The patient's blood pressure is 126/76, pulse is  83, respiratory rate 18, O2 sat is 92% on room air. I do not appreciate  any cervical or supraclavicular adenopathy.  Her breath sounds are  distant but without active wheezing today.  Do not hear any murmur of  aortic insufficiency.  ABDOMEN:  Benign.  She has no pedal edema.   CT scans done today are reviewed.  Compared to previous measurements on  scans that we just have the reports on, the aorta appears slightly  smaller.  The maximum diameter of the ascending aorta is 4.6 cm on the  axial views.  On the sagittal reconstructions a maximum of 5 cm.  The  descending aorta was 3.6 cm.  Intra-abdominal the small nondescript lung  nodules that had previously been noted are unchanged.   IMPRESSION:  1. 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  She is at      increased risk of catastrophic event because of the dilated      ascending aorta, but we have not seen it change and at this point      the risk of elective replacement may outweigh the benefits gained.      We will plan to see the patient back in 1 year with a follow-up CT      scan to evaluate any change.   Sheliah Plane, MD  Electronically Signed   EG/MEDQ  D:  11/08/2007  T:  11/08/2007  Job:  [id number]   cc:   Madolyn Frieze. Jens Som, MD, [facility]  [name], [place]  Janine Limbo, MD

## 2010-11-16 NOTE — Assessment & Plan Note (Signed)
OFFICE VISIT   Morgan Hays, Morgan Hays  DOB:  [birth year]                                        Nov 19, 2008  CHART #:  [id number]   The patient returns to the office today with a followup CT scan of the  chest.  I originally saw her in June 2008 because of question of dilated  ascending aorta.  Previous to me seeing her, she had CT scans done in  November 2007 and May 2008.  She returns today with a followup CT scan.  Since I last saw her, she attempted suicide with drug overdose in  February 2010, was hospitalized, intubated and developed aspiration  pneumonia.  She also notes that this year she has had one other episode  of pneumonia.  She continues to see Dr. Delton Coombes for pulmonary disease and  requiring home oxygen.   PHYSICAL EXAMINATION:  VITAL SIGNS:  Her blood pressure is 120/89, pulse  is 87, respiratory rate is 18, and O2 sats 96% at rest on 2 liters of  oxygen.  She notes that when she was exercised, her O2 sats dropped well  into the 80s.  LUNGS:  Her breath sounds are distant.  There is no active wheezing.  CARDIAC:  Regular rate and rhythm.  I do not appreciate any murmur.  EXTREMITIES:  She has mild pedal edema.   Her medicine list is reviewed.  She continues on Levoxyl, Prozac,  lisinopril, hydrochlorothiazide, multivitamins, calcium, aspirin 81 mg,  lorazepam, Advair, Spiriva, Proventil, venlafaxine 100 mg b.i.d.,  Flonase, Symbicort, and albuterol inhaler.   A CT scan of the chest done last week is reviewed and is basically  unchanged.  She had several small nodules in the right lung that have  been stable over the past 2 years.  The ascending aorta is just measured  on the axial and sagittal, measures a maximum of just under 5 cm.  Descending aorta measured on the sagittal view is about 4.2.   With unchanged pulmonary nodules and unchanged size of her ascending  aorta with significant underlying pulmonary disease, I would not  recommend any  surgical intervention, although she is at increased risk  for ascending aortic rupture.  She has not reached the size warranting  surgery even for a low-risk patient and with her underlying pulmonary  disease, a risk of surgery would be much more significant.  At this  point, I have recommend to her we just repeat her CT scan in 1 year.  She continues to be followed by Dr. Jens Hays and Dr. Delton Coombes, Pulmonary  and Cardiology.  I will plan to see her back in 1 year.   Sheliah Plane, MD  Electronically Signed   EG/MEDQ  D:  11/19/2008  T:  11/20/2008  Job:  [id number]   cc:   Morgan Hays. Morgan Som, MD, [facility]  Morgan Peer, MD  Morgan Juneau, PA

## 2010-11-16 NOTE — Assessment & Plan Note (Signed)
OFFICE VISIT   Morgan Hays, Morgan Hays  DOB:  1940/09/10                                        Nov 19, 2009  CHART #:  [id number]   The patient returns to office today with a followup CT scan.  She was  originally seen in June 2008 after an incidental CT scan was performed  to rule out pulmonary embolus and noted that she had a mildly dilated  ascending aorta.  The patient continues at home with significant  underlying pulmonary disease, on home oxygen.  She denies any definite  chest pain.   On exam her blood pressure 135/82, pulse 104, respiratory rate 18, O2  saturations 98% on 2 L of oxygen.  Breath sounds are distant but without  active wheezing.  I do not appreciate any murmur of aortic  insufficiency.  She has no pedal edema.   CT scan was performed.  She continues to show similar appearance of her  ascending aorta at the level of the right pulmonary artery.  This  measures 4.8 x 5.1 and similar to previous exams.  The right lung nodule  described on prior exams not seen.  There is question of left lower lobe  mild nodularity, was not present previously.  The patient does give a  history of at least three episodes of pneumonia in the past year.   With the patient's significant underlying pulmonary disease, I doubt she  will become a reasonable operative candidate, but currently we do not  see her aorta enlarging in size and is below limit for elective surgical  resection.  I will plan to see the patient back in 1 year with a  followup CT scan.   Sheliah Plane, MD  Electronically Signed   EG/MEDQ  D:  11/19/2009  T:  11/20/2009  Job:  [id number]   cc:   Marianne Sofia, PA-C

## 2010-11-16 NOTE — Assessment & Plan Note (Signed)
[facility]                         ELECTROPHYSIOLOGY OFFICE NOTE   QUANIYA, DAMAS                          MRN:          [id number]  DATE:06/04/2007                            DOB:          [birth year]    Ms. Casillas returns today for followup.  She is a very pleasant middle-  aged woman with a history of SVT status post ablation, a history of  unexplained dyspnea followed by Dr. Delton Coombes, with a pulmonary nodule on  the CT.  She also has COPD and an ascending aortic aneurysm with recent  CT of the chest demonstrating that her pulmonary nodule is actually a  little smaller than it had been and that her ascending aneurysm was  stable. She returns today for followup.  She notes that her blood  pressure has actually recently been on the low side and she ultimately  had to decrease her dose of ACE inhibitor.  She has had no syncope.  She  does occasionally feel dizzy but has had no palpitations.  She also  notes that she is scheduled to have cataract surgery in the coming year  and asked about surgical preop eval.  The patient has otherwise been  stable.   EXAM:  She is a pleasant, middle-aged woman in no acute distress.  Blood  pressure was 150/80, the pulse 90 and regular, the respirations were 18,  weight was 173 pounds.  LUNGS:  Clear bilaterally to auscultation.  No wheezes, rales or rhonchi  were present.  CARDIOVASCULAR EXAM:  A regular rate and rhythm with normal S1 and S2.  EXTREMITIES:  Demonstrate no edema.   MEDICATIONS:  1. Multivitamin.  2. Aspirin.  3. Advair 250/50.  4. Ativan p.r.n.  5. Prozac 40 a day.  6. Lisinopril/HCTZ 20/12.5, one-half tablet daily.  7. Levoxyl 125 mcg daily.  8. Spiriva daily.   IMPRESSION:  1. Chronic obstructive pulmonary disease.  2. Pulmonary nodule.  3. Supraventricular tachycardia, status post ablation.  4. Ascending aortic aneurysm.   DISCUSSION:  Overall, Ms. Rotundo is stable.  She is considering  cataract  surgery in the upcoming weeks and from a surgical risk perspective she  would be considered low risk from  cataract surgery.  We will see her back on a p.r.n. basis.  She will  follow up with her primary cardiologist Dr. Jens Som.     [name]. [name], MD  Electronically Signed    GWT/MedQ  DD: 06/04/2007  DT: 06/04/2007  Job #: [id number]

## 2010-11-16 NOTE — Assessment & Plan Note (Signed)
[facility]                            CARDIOLOGY OFFICE NOTE   CELE, Morgan Hays                          MRN:          [id number]  DATE:03/14/2007                            DOB:          August 14, 1940    Morgan Hays is a 70 year old female that I have been seeing for a  thoracic aneurysm, dyspnea, and history of paroxysmal atrial  tachycardia.  Note, she did see Dr. [name] and her SVT was ablated  successfully.  Dr. [name] did order a CPX on the patient secondary to  dyspnea.  This was performed on February 10, 2007.  It was felt to be  consistent with a mild functional limitation primarily due to the  patient's obstructive lung disease.  Also of note, the patient had  pulmonary function tests ordered by Dr. Tyrone Sage and performed on January 04, 2007.  She was found to have an FEV1 of 1 L.  It was felt to be  consistent with severe airflow limitation and a positive bronchodilator  response.  She has also been seen by Dr. Tyrone Sage, and a follow-up CT  has been ordered for November for her thoracic aneurysm and her lung  nodule.  Note, we did perform a Myoview in March that showed no ischemia  or infarction with an ejection fraction of 80%.  Previous echo in May  2007 showed normal LV function, mild aortic root dilatation, and mild  aortic insufficiency.  Since I last saw her she denies orthopnea, PND or  pedal edema but she does have worsening dyspnea on exertion.  She  occasionally has pain in her chest that is not exertional and lasts for  several minutes.  She does not have exertional symptoms.   MEDICATIONS:  1. A multivitamin one p.o. daily.  2. Aspirin 81 mg p.o. daily.  3. Advair 250/50 mcg one puff daily.  4. Ativan.  5. Levoxyl 150 mcg p.o. daily.  6. Prozac 40 mg p.o. daily.  7. Calcium.  8. Lisinopril/HCT 20/25 mg daily.   Her physical exam today shows a blood pressure of 127/74 and pulse is  87.  She weighs 161 pounds.  HEENT:  Normal.  NECK:  Supple.  CHEST:  Clear.  CARDIOVASCULAR:  Regular rate and rhythm.  ABDOMEN:  Benign.  EXTREMITIES:  No edema.   Her electrocardiogram shows a sinus rhythm at a rate of 84.  There is no  RV conduction delay.  There are no ST changes.   DIAGNOSES:  1. Dyspnea.  Ms. Rochette has progressive dyspnea on exertion.  Note,      her Myoview previously showed no ischemia or infarction and normal      left ventricular function.  Given the results of her CPX that      showed limitation due to chronic obstructive pulmonary disease and      her pulmonary function tests showing chronic obstructive pulmonary      disease, I think this is most likely pulmonary-related.  We will      refer her to one of our pulmonologists for further assessment and  management.  Note, she did have worsening dyspnea with addition of      Toprol and this has been discontinued.  Also note she has a long      history of tobacco use but has not smoked in 7 years.  We will      check a BNP as well.  2. Thoracic aortic aneurysm.  This is being followed by Dr. Tyrone Sage      and we plan a follow-up chest CT in November.  This will also help      follow her lung nodule.  3. History of lung nodule.  As per #2.  4. History of supraventricular tachycardia, status post ablation.      Note, she has not had any further palpitations.  5. History of mitral valve prolapse.  6. Hypertension.  Blood pressure is well-controlled.  I will check      BMET today to follow potassium and renal function.  7. History of anxiety/depression.   I will see her back in 6 months.     Madolyn Frieze Jens Som, MD, [facility]  Electronically Signed    BSC/MedQ  DD: 03/14/2007  DT: 03/15/2007  Job #: [id number]   cc:   Sheliah Plane, MD  Royce Macadamia, PA

## 2010-11-16 NOTE — Assessment & Plan Note (Signed)
[facility]                         ELECTROPHYSIOLOGY OFFICE NOTE   Morgan Hays, Morgan Hays                          MRN:          [id number]  DATE:01/16/2007                            DOB:          1941-03-19    HISTORY OF PRESENT ILLNESS:  Morgan Hays returns today for followup. She  is a very pleasant middle aged woman with a history of tachy-  palpitations and documented SVT, hypertension, who underwent  electrophysiology study and catheter ablation of SVT several weeks ago.  The patient has had no recurrent palpitations and denies any current  racing. She does complain of continued dyspnea with exertion. She is  subsequently undergone evaluation for this and has apparently had a  cardiopulmonary stress test done several weeks ago, which we do not have  the results on at present.   MEDICATIONS:  Include Digoxin , Lisinopril/HCTZ, Verapamil, Advair,  Ativan, and Prozac.   PHYSICAL EXAMINATION:  GENERAL:  She is a pleasant, well appearing woman  in no acute distress.  VITAL SIGNS:  Blood pressure 118/54, pulse 72 and regular. Respiratory  rate 18. Weight 151 pounds.  NECK:  No jugular venous distention.  LUNGS:  Clear to auscultation bilaterally. No wheezes, rales, or  rhonchi.  CARDIOVASCULAR:  Regular rate and rhythm. Normal S1 and S2.  EXTREMITIES:  Demonstrated no edema.   DIAGNOSTIC RESULTS:  Her initial pulse oximetry was 98% and her heart  rate was 70. We walked her in the hall briskly and her heart rate  increased to 95. Her oximetry decreased to 96% saturation.   IMPRESSION:  Supraventricular tachycardia status post catheterization  ablation.  1. Continued dyspnea, unclear etiology.   DISCUSSION:  The etiology of the patient's dyspnea is unclear. She has  apparently undergone cardiopulmonary stress testing and we will get the  results of this. Followup with Dr. Jens Hays for additional evaluation of  her dyspnea.     Morgan Hays. Morgan Ridgel,  MD  Electronically Signed    GWT/MedQ  DD: 01/16/2007  DT: 01/16/2007  Job #: [id number]

## 2010-11-16 NOTE — Consult Note (Signed)
NEW PATIENT CONSULTATION   Morgan Hays, Morgan Hays  DOB:  [birth year]                                        December 22, 2006  CHART #:  [id number]   FOLLOWUP CARDIOLOGIST:  Madolyn Frieze. Jens Som, MD, [facility]   PRIMARY CARE PHYSICIAN:  [name], [place].   REASON FOR CONSULTATION:  Ascending thoracic aneurysm.   HISTORY OF PRESENT ILLNESS:  The patient is referred to the office at  the request of Dr. [name] because of findings on CT scan of the chest of  a dilated ascending aorta. The patient has a history of CT scans of the  chest done in November of 2007 and May of 2008.  I do not have the disk  of the one done in 2007. The patient came to attention because of a long  history of paroxysmal supraventricular tachycardia and was being  evaluated by cardiology and referred for a catheter ablation, which was  performed by Dr. [name] several weeks ago. Since that time, the patient  has had no complaints of irregular heart beat. Her primary complaints,  she does have shortness of breath with exertion and has described some  exertion related wheezing. She has had no previous history of myocardial  infarction. She does have other cardiac risk factors including  hypertension. She denies hyperlipidemia, denies diabetes. Is a remote  smoker, having quit in 2001. She has a positive family history of  cardiac disease. Her father died at age 55, having some type of cardiac  procedure done in the early 50's, possibly a closed mitral  commissurotomy. Details of this are unclear. She had a son, who died at  age 24 after having HSS bacterial endocarditis, aortic valve  replacement, and pacer replacement. The patient's mother is alive at age  86 with congestive heart failure. She denies previous stroke,  claudication, or renal insufficiency.   PAST MEDICAL HISTORY:  Significant for hypothyroidism. She has had a  previous history of mitral valve prolapse.   PAST SURGICAL HISTORY:  Includes catheter  ablation 2 weeks ago.  Tonsillectomy in 1951, appendectomy 1956, vaginal hysterectomy 1975,  excision of breast mass 1990, 3 pregnancies with no problems.   SOCIAL HISTORY:  The patient is married. Cares for a child with cerebral  palsy. Also works as an Public house manager.   MEDICATIONS:  Levoxyl 150 mcg, Prozac 40 mg daily,  Lisino/hydrochlorothiazide 20/25 one-and-one-half daily, multivitamin,  calcium, aspirin, Lorazepam, and Advair.   ALLERGIES:  NO KNOWN DRUG ALLERGIES.   REVIEW OF SYSTEMS:  CARDIOVASCULAR:  The patient has vague heaviness in  the chest, rarely. She does have exertional shortness of breath and  notes wheezing with exercise. Denies orthopnea, syncope, pre-syncope,  palpitations, resting shortness of breath, and lower extremity edema.  GENERAL:  The patient denies any constitutional symptoms. HEENT:  Has  had no vision changes. Denies any hearing changes. HEART:  Does have  palpitations. Does have shortness of breath. ABDOMEN:  Denies  gallstones, vomiting, melena, hematochezia. Denies renal stones,  hematuria, dysuria. SKIN:  Has had no skin changes. EXTREMITIES:  Denies  any joint pain. Denies easy bruisability and abnormal bleeding.  NEUROLOGIC:  Denies headaches or TIA's or seizures. She does have a  history of depression. Denies polyuria, polydipsia.   PHYSICAL EXAMINATION:  VITAL SIGNS:  Blood pressure 139/72, pulse 69,  respiratory rate 18,  O2 sat is 98%. The patient is 5 feet 4 inches tall,  152 pounds.  NEUROLOGIC:  Intact and able to relate her history in good detail.  NECK:  No carotid bruits.  LUNGS:  Clear without wheezing today.  CARDIAC:  Examination today reveals no murmur. Normal S1 and S2. She has  had a history of mitral valve prolapse in the past.  ABDOMEN:  Examination benign without palpable masses.  EXTREMITIES:  Without swelling. She has easily palpable popliteal, DP,  and PT pulses bilaterally.   LABORATORY DATA:  No findings are sent with her from  her medical  physician.   DIAGNOSTIC STUDIES:  She has had no cardiac catheterization. CT scan of  the chest dated Nov 13, 2006 is reviewed, which shows dilated ascending  aorta to a very maximum of 5.4. My reading is 5.2 to 5.3. The proximal  descending thoracic aorta approximately 3.9. She has a 4 mm sub-pleural  nodule superiorly on the right. Radiology recommended a followup CT scan  in 6 months. She has mild calcification involving the coronary arteries.   Patient with dilated ascending aorta without evidence of dissection.  Change from the scan done elsewhere in November. Was reported at  approximately 5 cm, though I do not have a report or the disk to  compare.   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 Unknown status of her coronary arteries with recent  ablation for supraventricular tachycardia. I have discussed the findings  on the CT scan with the patient and have suggested that we obtain a  followup CT scan in 6 months, both to evaluate the size and change in  her aorta and also if there are any changes in this sub-pleural nodule  noted on the right. In addition, at the same time, will obtain an  echocardiogram to evaluate the cardiac valves and aortic root. Will  obtain pulmonary function studies to quantitate any respiratory  limitations on her breathing ability.   I have discussed the case with Dr. Jens Som. The patient is currently  not on a beta blocker. Her blood pressure in the office today is  slightly elevated. In light of some dilatation of the ascending aorta, a  beta blocker would be indicated. We decided to start her on a low-dose  of Toprol 25 mg once daily with the warning about should she have  increasing wheezing or respiratory difficulty, to contact us  immediately. She is to see Dr. [name] in early July. I plan to see her  back with  echocardiogram and CT scan in November.   Sheliah Plane, MD  Electronically Signed   EG/MEDQ  D:  12/22/2006  T:  12/23/2006  Job:  [id number]   cc:   Madolyn Frieze. Jens Som, MD, [facility]  [name]. [name], MD  [name], PA

## 2010-11-19 NOTE — Assessment & Plan Note (Signed)
[facility]                            CARDIOLOGY OFFICE NOTE   Morgan Hays, Morgan Hays                          MRN:          [id number]  DATE:08/23/2006                            DOB:          14-Nov-1940    Morgan Hays is a very pleasant 70 year old female with past medical  history of mitral valve prolapse, paroxysmal atrial tachycardia,  thoracic aortic aneurysm, mild COPD, hypertension who we are asked to  evaluate for dyspnea.  The patient has previously been seen by Morgan Hays  in the past.  She did have an echocardiogram in May 2007.  She was found  to have normal LV function.  There was possible diastolic dysfunction.  There was a mildly dilated aortic root and mild mitral regurgitation and  aortic insufficiency were noted.  She states that she has had dyspnea on  exertion for the past 9 months to one year.  This can occur with routine  activities.  It does not occur at rest.  There is no orthopnea, PND,  pedal edema, palpitations, or syncope.  She also occasionally has a  discomfort in her chest that is not exertional.  It is described as a  heaviness.  There is no associated shortness of breath, nausea,  vomiting, or diaphoresis.  She does not have exertional chest pain.  She  does have a history of paroxysmal atrial tachycardia but has not had any  in the past 3 years.  Because of her dyspnea, we were asked to further  evaluate.   Her medications include:  1. Levoxyl 125 mg p.o. daily.  2. Digoxin 0.25 mg alternating with 0.125 mg.  3. Prozac 20 mg p.o. daily.  4. Lisinopril HCT 20/12.5 mg tablets one-and-a-half p.o. daily.  5. Verapamil 360 mg p.o. daily.  6. Multivitamin.  7. Calcium 300 mg p.o. b.i.d.  8. Aspirin 81 mg p.o. daily.  9. Advair.  10.Ativan.   She has no known drug allergies.   SOCIAL HISTORY:  She does have a long history of tobacco use but has not  smoked since 2001.  She does not consume alcohol.   FAMILY HISTORY:   Positive for hypertrophic obstructive cardiomyopathy in  her son and he also had an aortic valve replacement.  She also has a  history of coronary disease in her family.   PAST MEDICAL HISTORY:  Significant for hypertension but there is no  diabetes mellitus or hyperlipidemia.  She does have a history of mild  COPD.  She has a history of mitral valve prolapse and paroxysmal atrial  tachycardia.  She has had a prior tonsillectomy and appendectomy and  partial hysterectomy.  There is a history of anxiety and depression.  She also has arthritis.   REVIEW OF SYSTEMS:  She denies any headaches at present.  She has had  these in the past.  There are no fevers or chills or productive cough.  There is no dysphagia, odynophagia, melena or hematochezia.  There is no  dysuria or hematuria.  There is no rash or seizure activity.  There is  no  orthopnea, PND, or pedal edema.  The remaining systems are negative.   PHYSICAL EXAMINATION TODAY:  VITAL SIGNS:  Shows a blood pressure of  149/61 and a pulse of 72.  She weighs 156 pounds.  GENERAL:  She is well-developed and well-nourished in no acute distress.  Her skin is warm and dry.  She does not appear to be depressed.  There  is no peripheral clubbing.  Her back is normal.  HEENT:  Unremarkable with normal eyelids.  NECK:  Supple with a normal upstroke bilaterally and I could not  appreciate bruits.  There is no jugular venous distention and no  thyromegaly is noted.  CHEST:  Clear to auscultation with normal expansion.  CARDIOVASCULAR:  Reveals a regular rate and rhythm with normal S1 and  S2.  There is a soft S4.  I could not appreciate a murmur.  There is no  rub noted.  Her PMI is nondisplaced.  ABDOMINAL:  Not tender or distended, positive bowel sounds.  No  hepatosplenomegaly and no masses appreciated.  There is mild tenderness  to deep palpation.  She has 2+ femoral pulses bilaterally and no bruits.  EXTREMITIES:  Show varicosities and  trace edema.  I cannot palpate  cords.  She has 2+ dorsalis pedis pulses bilaterally.  NEUROLOGIC:  Exam is grossly intact.   Her electrocardiogram shows sinus rhythm at a rate of 72.  There are no  ST changes noted.   DIAGNOSES:  1. Dyspnea on exertion.  2. Thoracic aortic aneurysm measuring 5 cm in November 2007.  3. History of mitral valve prolapse.  4. History of paroxysmal atrial tachycardia.  5. History of mild chronic obstructive pulmonary disease.  6. Hypertension.  7. History of anxiety/depression.   PLAN:  Morgan Hays presents for evaluation of dyspnea of uncertain  etiology.  She had an echocardiogram back in May that was normal.  We  will plan to risk stratify with a stress Myoview.  If it shows normal  perfusion then I do not think we need to pursue further cardiac workup.  I will also check a BNP to help distinguish pulmonary from cardiac  causes for her dyspnea.  She does have an ascending thoracic aortic  aneurysm and will need a followup chest CT in May 2008.  If it does not  increase in dimensions at that time then we will do this on a yearly  basis.  She also has some pain on deep palpation in her mid abdomen and  given her thoracic aortic aneurysm I think we need to exclude abdominal  aortic aneurysm.  We will  schedule her to have an abdominal ultrasound.  She will otherwise  continue with her present medications and her blood pressure is well  controlled.  I will see her back in 6 months.     Morgan Frieze Jens Som, MD, [facility]  Electronically Signed    BSC/MedQ  DD: 08/23/2006  DT: 08/23/2006  Job #: [id number]   cc:   Morgan Hays, P.A.

## 2010-11-19 NOTE — Assessment & Plan Note (Signed)
[facility]                            CARDIOLOGY OFFICE NOTE   Morgan Hays, Morgan Hays                          MRN:          [id number]  DATE:11/03/2006                            DOB:          Jun 05, 1941    Morgan Hays is a very pleasant 70 year old female that I recently saw on  August 23, 2006.  She has a history of mitral valve prolapse,  paroxysmal atrial tachycardia, thoracic aortic aneurysm, and  hypertension.  At that time it was noted that she had had a previous  echocardiogram in May of 2007.  At that time she was found to have  normal LV function.  There was mild aortic root dilatation, mild aortic  insufficiency, and mild mitral regurgitation.  When I saw her previously  she was complaining of some dyspnea.  We scheduled here to have a  Myoview on September 06, 2006.  Her LV function was normal with an estimated  ejection fraction of 80% and there was no scar or ischemia.  She also  had an ultrasound of her abdomen that showed no aneurysm.  It was noted  that she previously had a chest CT that showed a 5-cm thoracic aneurysm.  Since I last saw her she had an episode of SVT.  She was sweeping and  developed a sudden onset of weaknesses as well as palpitations, mild  dyspnea, presyncope, and mild chest discomfort.  She was unable to break  the rhythm and she went to [facility].  An electrocardiogram  revealed an SVT at a rate of 205.  There was upsloping ST depression in  the inferolateral leads.  She did have a chest x-ray that showed COPD.  Her potassium was 4.6.  Her cardiac markers were unremarkable.  Since  then she has not had any further palpitations but she has had some  weakness.  She has continued dyspnea on exertion which is unchanged.  There is no orthopnea, PND, or pedal edema and there is no exertional  chest pain.   MEDICATIONS:  1. Digoxin 0.25 mg alternating with the 0.125 mg p.o. daily.  2. Lisinopril HCT 20/25 mg tablets  1-1/2 p.o. daily.  3. Verapamil 360 mg p.o. daily.  4. Multivitamin 1 p.o. daily.  5. Aspirin 81 mg p.o. daily.  6. Advair.  7. Ativan.  8. Levoxyl.  9. Prozac.  10.Calcium.   PHYSICAL EXAMINATION:  Shows a blood pressure of 124/71 and her pulse is  84.  She weighs 153 pounds.  NECK:  Supple.  CHEST:  Clear.  CARDIOVASCULAR:  Reveals a regular rate and rhythm.  EXTREMITIES:  Show no edema.   Her electrocardiogram shows a sinus rhythm at a rate of 85.  There is an  RV conduction delay and minor nonspecific ST changes are noted.  Note,  she did have a BNP previously secondary to her dyspnea and it was normal  at 11.   DIAGNOSES:  1. Supraventricular tachycardia - the patient is having these      infrequently but they are extremely symptomatic.  This is despite  Verapamil and Digoxin.  She had to be taken to the emergency room      for adenosine for termination.  Given that these are so symptomatic      I have recommended that she see one of our electrophysiologists for      consideration of ablation.  She is in agreement with this.  We will      arrange for her to see Dr. [name] at her request.  2. History of thoracic aortic aneurysm measuring 5-cm in November of      2007 - we will plan to repeat her chest CT.  If it enlarged      significantly then she will need to see one of the thoracic      surgeons for possible aortic root replacement.  3. History of mitral valve prolapse.  4. History of paroxysmal atrial tachycardia.  5. Chronic obstructive pulmonary disease - this may be the explanation      for her dyspnea.  Note her BNP previously was normal and she has      normal left ventricular function with normal profusion.  6. Hypertension - her blood pressure is well controlled on her present      medications.  7. History of anxiety/depression - per her primary care.   We will see her back in 6 months, but we will arrange for her to be seen  by electrophysiologist  sooner.     Madolyn Frieze Jens Som, MD, [facility]  Electronically Signed    BSC/MedQ  DD: 11/03/2006  DT: 11/03/2006  Job #: [id number]   cc:   Royce Macadamia, PA

## 2010-12-03 ENCOUNTER — Encounter: Payer: Self-pay | Admitting: Emergency Medicine

## 2010-12-06 ENCOUNTER — Encounter: Payer: Self-pay | Admitting: Emergency Medicine

## 2010-12-06 ENCOUNTER — Ambulatory Visit (INDEPENDENT_AMBULATORY_CARE_PROVIDER_SITE_OTHER): Payer: Medicare Other | Admitting: Emergency Medicine

## 2010-12-06 VITALS — BP 162/74 | HR 122 | Temp 98.0°F | Ht 65.0 in | Wt 192.0 lb

## 2010-12-06 DIAGNOSIS — J441 Chronic obstructive pulmonary disease with (acute) exacerbation: Secondary | ICD-10-CM

## 2010-12-06 MED ORDER — PREDNISONE 10 MG PO TABS: ORAL_TABLET | ORAL | Status: DC

## 2010-12-06 MED ORDER — DOXYCYCLINE HYCLATE 100 MG PO TABS: 100.0000 mg | ORAL_TABLET | Freq: Two times a day (BID) | ORAL | Status: AC

## 2010-12-06 NOTE — Patient Instructions (Signed)
Continue your inhaled medications as you are taking them Start using your oxygen on continuous mode at 3L/min until next visit Take doxycycline and prednisone as directed Follow up with Dr Delton Coombes in 1 month

## 2010-12-06 NOTE — Assessment & Plan Note (Signed)
AE COPD in setting recent URI - rx w doxy + pred - same BDs - O2 at 3L/min continuous until next time, then consider back to intermittant - rov 1 mo

## 2010-12-06 NOTE — Progress Notes (Signed)
Ms. Standiford is a 70 year old woman who follows up today for her COPD, dyspnea, and for a history of a RLL pulmonary nodule. 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 No sgy has been planned due to her significant lung disease. Also with hx depression.   ROV 09/02/09 -- Regular f/u visit. No exacerbations since last visit. Able to do her usual activities, sometimes has to stop to rest. No cough. Ocas wheeze, responds to Proventil. Occas neb use also. Tells me that she had Pneumovax in 04/2009.   ROV 01/11/10 -- regular f/u for her COPD, cough. Tells me that about 3 weeks ago noticed some worsening DOE, some increased cough. Unfortunately since our last visit her husband was dx with colon CA. The humidity and heat are bothering her breathing. Cough is non-productive. having a lot of PND. Takes loratadine and fluticasone. Takes her BD's reliably.   ROV 05/20/10 -- COPD, cough, allergies  the pt comes in for an acute sick visit. She has known copd with chronic respiratory failure that is oxygen dependent. She reports increasing sob with chest congestion, as well as cough with purulence starting 3 weeks ago. She was seen by her primary md, and given a prednisone course and zpak. She just finished the prednisone and feels much better. Her congestion has resolved, and her mucus is clear at this time. She was noted to have significant desaturation with ambulation despite her supplemental oxygen, but she is on pulsed mode. Her primary sent her for recent abg on 2lpm, with it showing  73/45/7.43 at rest. Her primary complaint today is doe, but she feels she is improved with rest.   ROV 09/06/10 -- COPD, seen 08/04/10 by [facility] for an acute flare after a URI. She is feeling better, back close to baseline. She still has exertional SOB, has to stop to rest with chores. Last time O2 was increased to 3L/min at all times, finished azithro + Pred. Wheezing better. Taking Spiriva and Symbicort.   ROV 12/06/10 -- Hx COPD. She  presents today complaining of about a week of more cough, more DOE, more wheeze. Producing yellow/green phlegm. Had a URI in may that was treated with azithro + pred, her dyspnea wasn;t this bad then . She doesn't believe she ever really got over the URI. She has had a repeat CT scan of the chest done since our last visit, followed by Dr Lowella Fairy (for AAA).   Gen: Pleasant, obese, in no distress,  normal affect  ENT: No lesions,  mouth clear,  oropharynx clear, no postnasal drip  Neck: No JVD, no TMG, no carotid bruits  Lungs: Distant, B exp wheezes.   Cardiovascular: RRR, heart sounds normal, no murmur or gallops, trace edema  Musculoskeletal: No deformities, no cyanosis or clubbing  Neuro: alert, non focal  Skin: Warm, no lesions or rashes

## 2011-01-10 ENCOUNTER — Ambulatory Visit (INDEPENDENT_AMBULATORY_CARE_PROVIDER_SITE_OTHER): Payer: Medicare Other | Admitting: Emergency Medicine

## 2011-01-10 ENCOUNTER — Encounter: Payer: Self-pay | Admitting: Emergency Medicine

## 2011-01-10 DIAGNOSIS — J449 Chronic obstructive pulmonary disease, unspecified: Secondary | ICD-10-CM

## 2011-01-10 DIAGNOSIS — R05 Cough: Secondary | ICD-10-CM

## 2011-01-10 DIAGNOSIS — J309 Allergic rhinitis, unspecified: Secondary | ICD-10-CM

## 2011-01-10 MED ORDER — ROFLUMILAST 500 MCG PO TABS: 500.0000 ug | ORAL_TABLET | Freq: Every day | ORAL | Status: DC

## 2011-01-10 MED ORDER — OMEPRAZOLE 20 MG PO CPDR: 20.0000 mg | DELAYED_RELEASE_CAPSULE | Freq: Every day | ORAL | Status: DC

## 2011-01-10 NOTE — Progress Notes (Deleted)
  Subjective:    Patient ID: Morgan Hays, female    DOB: [birth year], 70 y.o.   MRN: [id number]  HPI    Review of Systems     Objective:   Physical Exam        Assessment & Plan:

## 2011-01-10 NOTE — Assessment & Plan Note (Signed)
Restart Spiriva Continue symbicort Trial daliresp O2

## 2011-01-10 NOTE — Assessment & Plan Note (Signed)
Start omeprazole 20mg  qd in addition to allergy regimen

## 2011-01-10 NOTE — Progress Notes (Signed)
Morgan Hays is a 70 year old woman who follows up today for her COPD, dyspnea, and for a history of a RLL pulmonary nodule. She is followed by Morgan Hays for aortic dilation. No sgy has been planned due to her significant lung disease. Also with hx depression.   ROV 09/02/09 -- Regular f/u visit. No exacerbations since last visit. Able to do her usual activities, sometimes has to stop to rest. No cough. Ocas wheeze, responds to Proventil. Occas neb use also. Tells me that she had Pneumovax in 04/2009.   ROV 01/11/10 -- regular f/u for her COPD, cough. Tells me that about 3 weeks ago noticed some worsening DOE, some increased cough. Unfortunately since our last visit her husband was dx with colon CA. The humidity and heat are bothering her breathing. Cough is non-productive. having a lot of PND. Takes loratadine and fluticasone. Takes her BD's reliably.   ROV 05/20/10 -- COPD, cough, allergies  the pt comes in for an acute sick visit. She has known copd with chronic respiratory failure that is oxygen dependent. She reports increasing sob with chest congestion, as well as cough with purulence starting 3 weeks ago. She was seen by her primary md, and given a prednisone course and zpak. She just finished the prednisone and feels much better. Her congestion has resolved, and her mucus is clear at this time. She was noted to have significant desaturation with ambulation despite her supplemental oxygen, but she is on pulsed mode. Her primary sent her for recent abg on 2lpm, with it showing  73/45/7.43 at rest. Her primary complaint today is doe, but she feels she is improved with rest.   ROV 09/06/10 -- COPD, seen 08/04/10 by [facility] for an acute flare after a URI. She is feeling better, back close to baseline. She still has exertional SOB, has to stop to rest with chores. Last time O2 was increased to 3L/min at all times, finished azithro + Pred. Wheezing better. Taking Spiriva and Symbicort.   ROV 12/06/10 -- Hx COPD. She  presents today complaining of about a week of more cough, more DOE, more wheeze. Producing yellow/green phlegm. Had a URI in may that was treated with azithro + pred, her dyspnea wasn;t this bad then . She doesn't believe she ever really got over the URI. She has had a repeat CT scan of the chest done since our last visit, followed by Morgan Hays (for AAA).   ROV 01/10/11 -- COPD, returns for f/u. I treated her for acute exacerbation w pred + doxy, couldn't tolerate the doxy so changed to avelox. She completed them and felt better when on the meds but then quickly felt bad again - cough, mucous, malaise, dyspnea w exertion.  She tells me that she does not take her Spiriva reliably, she does take the Symbicort + albuterol. She does take flonase and claritin.    Gen: Pleasant, obese, in no distress,  normal affect  ENT: No lesions,  mouth clear,  oropharynx clear, no postnasal drip, hoarse voice, moderate exp stridor  Neck: No JVD, no TMG, no carotid bruits  Lungs: Distant, B exp wheezes, with some referred UA noise.   Cardiovascular: RRR, heart sounds normal, no murmur or gallops, trace edema  Musculoskeletal: No deformities, no cyanosis or clubbing  Neuro: alert, non focal  Skin: Warm, no lesions or rashes

## 2011-01-10 NOTE — Assessment & Plan Note (Signed)
--  flonase  --loratadine

## 2011-01-10 NOTE — Patient Instructions (Signed)
Restart your Spiriva daily Continue your Symbicort  Wear your oxygen at all times Start Daliresp daily Start omeprazole 20mg  daily Follow up in 6 weeks or sooner if you have any problems.

## 2011-01-13 ENCOUNTER — Telehealth: Payer: Self-pay | Admitting: Emergency Medicine

## 2011-01-13 NOTE — Telephone Encounter (Signed)
Spoke with pt. She was started on daliresp on Monday 01/10/11- states since then has felt weak, diarrhea, and has had nausea, no vomiting. She states has lost 5 lbs since started med. I advised go ahead and stop since making her feel so bad, take immodium ad otc for diarrhea, push fluids and advance diet as tolerated. Will forward to doc of the day for further recs. Please advise, thanks!

## 2011-01-13 NOTE — Telephone Encounter (Signed)
Stay off Daliresp till this settles down. If wants, can then try again, taking after lunch. Otherwise just stay off till next ov with Dr Delton Coombes.

## 2011-01-13 NOTE — Telephone Encounter (Signed)
Called and spoke with pt.  Pt aware of CY's recs and will stay off Daliresp until she can discuss with RB at next ov.

## 2011-01-17 ENCOUNTER — Telehealth: Payer: Self-pay | Admitting: Pulmonary Disease

## 2011-01-17 ENCOUNTER — Telehealth: Payer: Self-pay | Admitting: Emergency Medicine

## 2011-01-17 ENCOUNTER — Encounter: Payer: Self-pay | Admitting: Adult Health

## 2011-01-17 ENCOUNTER — Ambulatory Visit (INDEPENDENT_AMBULATORY_CARE_PROVIDER_SITE_OTHER): Payer: Medicare Other | Admitting: Adult Health

## 2011-01-17 VITALS — BP 132/88 | HR 102 | Temp 98.2°F | Ht 65.0 in | Wt 184.8 lb

## 2011-01-17 DIAGNOSIS — R197 Diarrhea, unspecified: Secondary | ICD-10-CM

## 2011-01-17 DIAGNOSIS — K529 Noninfective gastroenteritis and colitis, unspecified: Secondary | ICD-10-CM | POA: Insufficient documentation

## 2011-01-17 DIAGNOSIS — K5289 Other specified noninfective gastroenteritis and colitis: Secondary | ICD-10-CM

## 2011-01-17 MED ORDER — ONDANSETRON HCL 4 MG PO TABS: 4.0000 mg | ORAL_TABLET | Freq: Three times a day (TID) | ORAL | Status: AC | PRN

## 2011-01-17 MED ORDER — LORAZEPAM 1 MG PO TABS: 1.0000 mg | ORAL_TABLET | Freq: Two times a day (BID) | ORAL | Status: DC

## 2011-01-17 NOTE — Assessment & Plan Note (Signed)
Pt with multiple somatic complaints primarily surrounding GI complaints with nausea, anorexia, malaise and diarrhea Appears to have started around the time she started on Daliresp. Daliresp does have high incidence of GI complaints however feel She has been off for >4 days , symptoms should be improving. Will get records from ER w/ labs .  Pt has been off of her Ativan for 1 week could be having some w/d symptoms along with  Side effects from Daliresp . Pt has continued to take all her maintenance meds on empty stomach which may also be contributing  To her symptoms  Will check C Diff stool sample (several recent abx. ).  Advised on bland diet with advancement slowly Use zofan to help with nausea If continues will need further workup and /or possible admission for IV fluids.  Please contact office for sooner follow up if symptoms do not improve or worsen or seek emergency care

## 2011-01-17 NOTE — Telephone Encounter (Signed)
Mrs. Salley called the answering service Sunday July 15 at 0815.  She was recently started on Daliresp and began having symptoms of decreased appetite, nausea, headaches on Wed July 11th.  She felt very anxious / nervous and called EMS and was transported to [facility].  She was given fluids, zofran, and oxycodone.  According to the patient her labs were all normal.  She called asking how long symptoms should last.  Voice over the phone did not indicate any distress/breathlessness.  She indicated she was still nauseated.  Zofran called in to [facility] [phone].  #6 tabs, Zofran ODT Q6 PRN n/v.  Encouraged Mrs. Buice to call the office Monday am and make an appointment to see Dr. Delton Coombes for medication follow-up.  Also, if further distress to return to the ER.

## 2011-01-17 NOTE — Telephone Encounter (Signed)
Spoke with pt. She states feeling some better, not taking daliresp any longer. She was seen here today by TP. Will forward to RB so that he is aware.

## 2011-01-17 NOTE — Patient Instructions (Signed)
Begin Bland diet - no lactose x 5 days Broth, toast, applesause, bananas, advance as tolerated.  Push fluids.  If not improving will need to be seen sooner or go to ER.  Stop Daliresp -place on Allergy/Intolerance list.  May use Zofran 4mg  every 8 hr as needed for nausea/vomitting.  I will refill Ativan for short course until refilled by primary doctor.  I will call with culture results.  Please contact office for sooner follow up if symptoms do not improve or worsen or seek emergency care  follow up Dr.Byrum as planned.

## 2011-01-17 NOTE — Progress Notes (Signed)
Morgan Hays is a 70 year old woman who follows up today for her COPD, dyspnea, and for a history of a RLL pulmonary nodule. 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 No sgy has been planned due to her significant lung disease. Also with hx depression.   ROV 09/02/09 -- Regular f/u visit. No exacerbations since last visit. Able to do her usual activities, sometimes has to stop to rest. No cough. Ocas wheeze, responds to Proventil. Occas neb use also. Tells me that she had Pneumovax in 04/2009.   ROV 01/11/10 -- regular f/u for her COPD, cough. Tells me that about 3 weeks ago noticed some worsening DOE, some increased cough. Unfortunately since our last visit her husband was dx with colon CA. The humidity and heat are bothering her breathing. Cough is non-productive. having a lot of PND. Takes loratadine and fluticasone. Takes her BD's reliably.   ROV 05/20/10 -- COPD, cough, allergies  the pt comes in for an acute sick visit. She has known copd with chronic respiratory failure that is oxygen dependent. She reports increasing sob with chest congestion, as well as cough with purulence starting 3 weeks ago. She was seen by her primary md, and given a prednisone course and zpak. She just finished the prednisone and feels much better. Her congestion has resolved, and her mucus is clear at this time. She was noted to have significant desaturation with ambulation despite her supplemental oxygen, but she is on pulsed mode. Her primary sent her for recent abg on 2lpm, with it showing  73/45/7.43 at rest. Her primary complaint today is doe, but she feels she is improved with rest.   ROV 09/06/10 -- COPD, seen 08/04/10 by [facility] for an acute flare after a URI. She is feeling better, back close to baseline. She still has exertional SOB, has to stop to rest with chores. Last time O2 was increased to 3L/min at all times, finished azithro + Pred. Wheezing better. Taking Spiriva and Symbicort.   ROV 12/06/10 -- Hx COPD. She  presents today complaining of about a week of more cough, more DOE, more wheeze. Producing yellow/green phlegm. Had a URI in may that was treated with azithro + pred, her dyspnea wasn;t this bad then . She doesn't believe she ever really got over the URI. She has had a repeat CT scan of the chest done since our last visit, followed by Dr Lowella Fairy (for AAA).   ROV 01/10/11 -- COPD, returns for f/u. I treated her for acute exacerbation w pred + doxy, couldn't tolerate the doxy so changed to avelox. She completed them and felt better when on the meds but then quickly felt bad again - cough, mucous, malaise, dyspnea w exertion.  She tells me that she does not take her Spiriva reliably, she does take the Symbicort + albuterol. She does take flonase and claritin.  >rx Daliresp  Acute OV 01/17/2011  Presents for an acute office visit. Complains of  nausea, cold sweats, tingling in both arms, weakness.  Pt says this started after she began Daliresp. Had significant nausea and weakness, anorexia. Each day symptoms progressed. Took daliresp x 3 days. Called office and stopped Daliresp on 7/12. However she continued to have nausea,no appetite and weakness. She did go to ER at [facility] 7/14 with neg CT head and labs per pt. We are trying to get copies of ER records currently. Today she presents for persistent nausea, no appetite and diarrhea. Says everytime she eats she has loose  stools. OF note she has been on abx x 3 over last  2months (Doxy, zpack , and avelox) . NO bloody stools. No abdominal pain or fever. She has continued to take all her routine meds despite not eating much . Says she ate ice cream last night but had diarreha right after eating this. Also ran out of her ativan over last 1  week. Has been on this Twice daily  For last 6 months but ran out of rx and has not had refilled . Feels she can not sleep -feels very stress and wired up.  Has headache and malaise.   Her sister in law is with her today.  We discussed hospitalization due to possible dehydration however she declines.   Breathing is better with decreased cough and congestion.     ROS:  Constitutional:   No  weight loss, night sweats,  Fevers, chills,  +fatigue, or  lassitude.  HEENT:   No headaches,  Difficulty swallowing,  Tooth/dental problems, or  Sore throat,                No sneezing, itching, ear ache, nasal congestion, post nasal drip,   CV:  No chest pain,  Orthopnea, PND, swelling in lower extremities, anasarca, dizziness, palpitations, syncope.   GI  No heartburn, indigestion, abdominal pain,   bloody stools.   Resp:   No excess mucus, no productive cough,  No non-productive cough,  No coughing up of blood.  No change in color of mucus.  No wheezing.  No chest wall deformity  Skin: no rash or lesions.  GU: no dysuria, change in color of urine, no urgency or frequency.  No flank pain, no hematuria   MS:  No joint pain or swelling.  No decreased range of motion.  No back pain.  Psych:  No change in mood or affect. No depression or anxiety.  No memory loss.        PE:  Gen: Pleasant, obese, in no distress,  Obese female.   ENT: No lesions,  mouth clear,  oropharynx clear, no postnasal drip  Neck: No JVD, no TMG, no carotid bruits  Lungs: Coarse BS w/ no wheezing    Cardiovascular: RRR, heart sounds normal, no murmur or gallops, trace edema  Musculoskeletal: No deformities, no cyanosis or clubbing  Neuro: alert, non focal  Skin: Warm, no lesions or rashes  Abd: obese,soft, NT, no guarding or rebound. Neg CVA tenderness

## 2011-01-17 NOTE — Telephone Encounter (Signed)
Please call to see how she is doing and to insure that she has stopped the Daliresp. Thanks.

## 2011-01-17 NOTE — Telephone Encounter (Signed)
Error.Juanita S Davis ° °

## 2011-01-25 ENCOUNTER — Telehealth: Payer: Self-pay | Admitting: Emergency Medicine

## 2011-01-25 NOTE — Telephone Encounter (Signed)
Daliresp PA initiated through [employer] at [phone]. Member ID # is [id number]. Request received from [facility], [phone]. Insurance has sent this for clinical review and should fax their decision within 48 to 72 hours. Will await response.

## 2011-01-25 NOTE — Telephone Encounter (Signed)
Form received and placed in RB look at for review and signature.

## 2011-01-26 NOTE — Telephone Encounter (Signed)
Last information I have is that pt did not tolerate daliresp - had GI side effects. There is no need to go through the approval process if she is not taking the med.

## 2011-01-26 NOTE — Telephone Encounter (Signed)
Morgan Hays AT [employer] CALLED FOR STATUS.  HE WAS INFORMED THAT FORM IS AWAITING DR BYRUM'S REVIEW

## 2011-01-27 NOTE — Telephone Encounter (Signed)
Spoke with the pt and she is not taking the medication. PA cancelled.Carron Curie, CMA

## 2011-02-09 ENCOUNTER — Other Ambulatory Visit: Payer: Self-pay | Admitting: Emergency Medicine

## 2011-02-09 MED ORDER — TIOTROPIUM BROMIDE MONOHYDRATE 18 MCG IN CAPS: 18.0000 ug | ORAL_CAPSULE | Freq: Every day | RESPIRATORY_TRACT | Status: DC

## 2011-02-09 NOTE — Telephone Encounter (Signed)
Pt last seen by TP 7.16.12, has upcoming appt with RB 8.20.12  Received refill request from [facility] for spiriva.  Refills sent electronically.  Med list updated.

## 2011-02-21 ENCOUNTER — Ambulatory Visit (INDEPENDENT_AMBULATORY_CARE_PROVIDER_SITE_OTHER): Payer: Medicare Other | Admitting: Emergency Medicine

## 2011-02-21 ENCOUNTER — Encounter: Payer: Self-pay | Admitting: Emergency Medicine

## 2011-02-21 DIAGNOSIS — J309 Allergic rhinitis, unspecified: Secondary | ICD-10-CM

## 2011-02-21 DIAGNOSIS — R05 Cough: Secondary | ICD-10-CM

## 2011-02-21 DIAGNOSIS — J449 Chronic obstructive pulmonary disease, unspecified: Secondary | ICD-10-CM

## 2011-02-21 DIAGNOSIS — R059 Cough, unspecified: Secondary | ICD-10-CM

## 2011-02-21 DIAGNOSIS — J4489 Other specified chronic obstructive pulmonary disease: Secondary | ICD-10-CM

## 2011-02-21 NOTE — Assessment & Plan Note (Addendum)
Continue loratadine and fluticasone

## 2011-02-21 NOTE — Patient Instructions (Signed)
Please continue all of your same mediations.  Wear your oxygen at all times Follow up with Dr Delton Coombes in 3 months or sooner if you have any problems.

## 2011-02-21 NOTE — Progress Notes (Signed)
Ms. Pines is a 70 year old woman who follows up today for her COPD, dyspnea, and for a history of a RLL pulmonary nodule. 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 No sgy has been planned due to her significant lung disease. Also with hx depression.   ROV 09/02/09 -- Regular f/u visit. No exacerbations since last visit. Able to do her usual activities, sometimes has to stop to rest. No cough. Ocas wheeze, responds to Proventil. Occas neb use also. Tells me that she had Pneumovax in 04/2009.   ROV 01/11/10 -- regular f/u for her COPD, cough. Tells me that about 3 weeks ago noticed some worsening DOE, some increased cough. Unfortunately since our last visit her husband was dx with colon CA. The humidity and heat are bothering her breathing. Cough is non-productive. having a lot of PND. Takes loratadine and fluticasone. Takes her BD's reliably.   ROV 05/20/10 -- COPD, cough, allergies  the pt comes in for an acute sick visit. She has known copd with chronic respiratory failure that is oxygen dependent. She reports increasing sob with chest congestion, as well as cough with purulence starting 3 weeks ago. She was seen by her primary md, and given a prednisone course and zpak. She just finished the prednisone and feels much better. Her congestion has resolved, and her mucus is clear at this time. She was noted to have significant desaturation with ambulation despite her supplemental oxygen, but she is on pulsed mode. Her primary sent her for recent abg on 2lpm, with it showing  73/45/7.43 at rest. Her primary complaint today is doe, but she feels she is improved with rest.   ROV 09/06/10 -- COPD, seen 08/04/10 by [facility] for an acute flare after a URI. She is feeling better, back close to baseline. She still has exertional SOB, has to stop to rest with chores. Last time O2 was increased to 3L/min at all times, finished azithro + Pred. Wheezing better. Taking Spiriva and Symbicort.   ROV 12/06/10 -- Hx COPD. She  presents today complaining of about a week of more cough, more DOE, more wheeze. Producing yellow/green phlegm. Had a URI in may that was treated with azithro + pred, her dyspnea wasn;t this bad then . She doesn't believe she ever really got over the URI. She has had a repeat CT scan of the chest done since our last visit, followed by Dr Lowella Fairy (for AAA).   ROV 01/10/11 -- COPD, returns for f/u. I treated her for acute exacerbation w pred + doxy, couldn't tolerate the doxy so changed to avelox. She completed them and felt better when on the meds but then quickly felt bad again - cough, mucous, malaise, dyspnea w exertion.  She tells me that she does not take her Spiriva reliably, she does take the Symbicort + albuterol. She does take flonase and claritin.  >rx Daliresp  Acute OV 01/17/2011  Presents for an acute office visit. Complains of  nausea, cold sweats, tingling in both arms, weakness.  Pt says this started after she began Daliresp. Had significant nausea and weakness, anorexia. Each day symptoms progressed. Took daliresp x 3 days. Called office and stopped Daliresp on 7/12. However she continued to have nausea,no appetite and weakness. She did go to ER at [facility] 7/14 with neg CT head and labs per pt. We are trying to get copies of ER records currently. Today she presents for persistent nausea, no appetite and diarrhea. Says everytime she eats she has loose  stools. OF note she has been on abx x 3 over last  2months (Doxy, zpack , and avelox) . NO bloody stools. No abdominal pain or fever. She has continued to take all her routine meds despite not eating much . Says she ate ice cream last night but had diarreha right after eating this. Also ran out of her ativan over last 1  week. Has been on this Twice daily  For last 6 months but ran out of rx and has not had refilled . Feels she can not sleep -feels very stress and wired up.  Has headache and malaise.   Her sister in law is with her today.  We discussed hospitalization due to possible dehydration however she declines.   Breathing is better with decreased cough and congestion.   ROV 02/21/11 -- Severe COPD on O2. On omeprazole + allergy regimen for cough. Last time we started trial daliresp - she couldn't tolerate, caused GI upset. She tell me that she is having some visual changes, lack of peripheral vision. She is seeing optho and a retinologist. Her breathing has been stable - has Class 2 -3 symptoms. Was treated with abx and pred in [place] 2 weeks ago. She describes some slep disturbance but doesn't want a PSG.   PE:  Gen: Pleasant, obese, in no distress,  Obese female.   ENT: No lesions,  mouth clear,  oropharynx clear, no postnasal drip  Neck: No JVD, no TMG, no carotid bruits  Lungs: Coarse BS w/ no wheezing    Cardiovascular: RRR, heart sounds normal, no murmur or gallops, trace edema  Musculoskeletal: No deformities, no cyanosis or clubbing  Neuro: alert, non focal  Skin: Warm, no lesions or rashes  Abd: obese,soft, NT, no guarding or rebound. Neg CVA tenderness   C O P D Spiriva + symbicort + SABA O2 Flu shot i8n the fall rov 3 months   ALLERGIC RHINITIS Continue loratadine and fluticasone  COUGH Continue omeprazole

## 2011-02-21 NOTE — Assessment & Plan Note (Signed)
Continue omeprazole 

## 2011-02-21 NOTE — Assessment & Plan Note (Signed)
Spiriva + symbicort + SABA O2 Flu shot i8n the fall rov 3 months

## 2011-03-09 ENCOUNTER — Other Ambulatory Visit: Payer: Self-pay | Admitting: *Deleted

## 2011-03-09 MED ORDER — FLUTICASONE PROPIONATE 50 MCG/ACT NA SUSP: 2.0000 | Freq: Every day | NASAL | Status: DC

## 2011-04-20 ENCOUNTER — Other Ambulatory Visit: Payer: Self-pay | Admitting: Cardiothoracic Surgery

## 2011-04-20 DIAGNOSIS — I712 Thoracic aortic aneurysm, without rupture: Secondary | ICD-10-CM

## 2011-05-10 ENCOUNTER — Ambulatory Visit (INDEPENDENT_AMBULATORY_CARE_PROVIDER_SITE_OTHER): Payer: Medicare Other | Admitting: Cardiology

## 2011-05-10 ENCOUNTER — Encounter: Payer: Self-pay | Admitting: Cardiology

## 2011-05-10 VITALS — BP 145/91 | HR 90 | Ht 64.0 in | Wt 186.0 lb

## 2011-05-10 DIAGNOSIS — I2510 Atherosclerotic heart disease of native coronary artery without angina pectoris: Secondary | ICD-10-CM

## 2011-05-10 DIAGNOSIS — E785 Hyperlipidemia, unspecified: Secondary | ICD-10-CM

## 2011-05-10 DIAGNOSIS — I719 Aortic aneurysm of unspecified site, without rupture: Secondary | ICD-10-CM

## 2011-05-10 DIAGNOSIS — I10 Essential (primary) hypertension: Secondary | ICD-10-CM

## 2011-05-10 MED ORDER — OLMESARTAN MEDOXOMIL-HCTZ 40-12.5 MG PO TABS: 1.0000 | ORAL_TABLET | Freq: Every day | ORAL | Status: DC

## 2011-05-10 NOTE — Assessment & Plan Note (Signed)
Continue statin. Lipids and liver monitored by primary care. 

## 2011-05-10 NOTE — Assessment & Plan Note (Signed)
Followed by Dr. Tyrone Sage. Not clear that she is a surgical candidate given severity of COPD.

## 2011-05-10 NOTE — Assessment & Plan Note (Signed)
No recurrences since previous visit.

## 2011-05-10 NOTE — Patient Instructions (Signed)
Your physician wants you to follow-up in: 1 year You will receive a reminder letter in the mail two months in advance. If you don't receive a letter, please call our office to schedule the follow-up appointment.  INCREASE BENICAR/HCT 40/12.5 MG ONCE DAILY

## 2011-05-10 NOTE — Progress Notes (Signed)
HPI:Morgan Hays is a pleasant female with a thoracic aneurysm, severe COPD, and paroxysmal atrial tachycardia. A Myoview  performed on September 27, 2007 revealed  normal LV function with an estimated ejection fraction of 78%.  There was normal perfusion.  She also had an echocardiogram in Nov 2010, that showed normal LV function, grade 1 diastolic dysfunction, aortic aneurysm, mild AI and mild to moderate MR. She also has a history of suicidal ideation. Her last CT scan was performed in May of 2010 and revealed stable aneurysm of the ascending aorta measuring 51 x 49  mm in axial dimension compared to 52 x 50 mm on prior. She is followed by Dr. Tyrone Sage  for this issue. We last saw her in November of 2011. Since then she continues to have dyspnea on exertion relieved with rest. It is unchanged compared to previous. It is not associated with chest pain. There is no orthopnea, PND, pedal edema, palpitations, syncope or exertional chest pain. Note her previous CAT scan also revealed calcium in her coronary arteries.  Current Outpatient Prescriptions  Medication Sig Dispense Refill  . albuterol (PROVENTIL HFA) 108 (90 BASE) MCG/ACT inhaler Inhale 2 puffs into the lungs every 6 (six) hours as needed.        [name] albuterol (PROVENTIL) (2.5 MG/3ML) 0.083% nebulizer solution Take 2.5 mg by nebulization 2 (two) times daily.        [name] aspirin 81 MG tablet Take 81 mg by mouth daily.        . budesonide-formoterol (SYMBICORT) 160-4.5 MCG/ACT inhaler Inhale 2 puffs into the lungs 2 (two) times daily.        . Calcium Carbonate (CALCIUM 500 PO) Take 1 capsule by mouth 2 (two) times daily.        . Cholecalciferol (VITAMIN D) 1000 UNITS capsule Take 1,000 Units by mouth daily.        . fluticasone (FLONASE) 50 MCG/ACT nasal spray Place 2 sprays into the nose daily.  16 g  5  . levothyroxine (SYNTHROID, LEVOTHROID) 125 MCG tablet Take 125 mcg by mouth daily.        [name] loratadine (CLARITIN) 10 MG tablet Take 10 mg by mouth daily.         [name] LORazepam (ATIVAN) 1 MG tablet Take 1 tablet (1 mg total) by mouth 2 (two) times daily.  15 tablet  0  . magnesium oxide (MAG-OX) 400 MG tablet Take 400 mg by mouth daily.        . Multiple Vitamin (MULTIVITAMIN) capsule Take 1 capsule by mouth daily.        [name] olmesartan-hydrochlorothiazide (BENICAR HCT) 20-12.5 MG per tablet Take 1 tablet by mouth daily.        [name] omeprazole (PRILOSEC) 20 MG capsule Take 1 capsule (20 mg total) by mouth daily.  30 capsule  5  . ondansetron (ZOFRAN) 4 MG tablet Take 1 tablet (4 mg total) by mouth every 8 (eight) hours as needed for nausea.  20 tablet  0  . polyethylene glycol (MIRALAX / GLYCOLAX) packet Take 17 g by mouth daily.        . pravastatin (PRAVACHOL) 40 MG tablet Take 40 mg by mouth daily.        . sertraline (ZOLOFT) 50 MG tablet Take 50 mg by mouth 2 (two) times daily.        [name] tiotropium (SPIRIVA) 18 MCG inhalation capsule Place 1 capsule (18 mcg total) into inhaler and inhale daily. As needed  30 capsule  6  . traZODone (DESYREL) 50 MG tablet Take 50 mg by mouth at bedtime.           Past Medical History  Diagnosis Date  . Supraventricular tachycardia   . MVP (mitral valve prolapse)   . Hypothyroid   . Hypertension   . Aortic aneurysm   . Pulmonary nodule   . Varicose vein   . Arthritis   . Depression   . COPD (chronic obstructive pulmonary disease)     Past Surgical History  Procedure Date  . Appendectomy 1956  . Total abdominal hysterectomy 1975  . Tubal ligation 1972  . Breast surgery     benign left breast cyst removed    History   Social History  . Marital Status: Married    Spouse Name: N/A    Number of Children: N/A  . Years of Education: N/A   Occupational History  . LPN    Social History Main Topics  . Smoking status: Former Smoker -- 1.0 packs/day for 50 years    Types: Cigarettes    Quit date: 07/05/1999  . Smokeless tobacco: Not on file  . Alcohol Use: Not on file  . Drug Use: Not on file  .  Sexually Active: Not on file   Other Topics Concern  . Not on file   Social History Narrative   Pt is married and lives with her husband. She works as an Public house manager.  She has not had any known TB exposures.  She is a former smoker.  Quit in 2001.  She has approx 50 pack year total history.  Does not use alcohol     ROS: no fevers or chills, productive cough, hemoptysis, dysphasia, odynophagia, melena, hematochezia, dysuria, hematuria, rash, seizure activity, orthopnea, PND, pedal edema, claudication. Remaining systems are negative.  Physical Exam: Well-developed well-nourished in no acute distress.  Skin is warm and dry.  HEENT is normal.  Neck is supple. No thyromegaly.  Chest is diminished breath sounds throughout Cardiovascular exam is regular rate and rhythm.  Abdominal exam nontender or distended. No masses palpated. Extremities show no edema. neuro grossly intact  ECG sinus rhythm at a rate of 90. Right atrial enlargement. RV conduction delay.

## 2011-05-10 NOTE — Assessment & Plan Note (Signed)
Blood pressure elevated. Increase Benicar HCT to 40/12.5 daily.

## 2011-05-10 NOTE — Assessment & Plan Note (Signed)
Presumed secondary to calcium noted on previous CT scan. Continue aspirin and statin. Previous Myoview showed no ischemia.

## 2011-05-16 ENCOUNTER — Ambulatory Visit (INDEPENDENT_AMBULATORY_CARE_PROVIDER_SITE_OTHER): Payer: Medicare Other | Admitting: Emergency Medicine

## 2011-05-16 ENCOUNTER — Encounter: Payer: Self-pay | Admitting: Emergency Medicine

## 2011-05-16 DIAGNOSIS — J309 Allergic rhinitis, unspecified: Secondary | ICD-10-CM

## 2011-05-16 DIAGNOSIS — J449 Chronic obstructive pulmonary disease, unspecified: Secondary | ICD-10-CM

## 2011-05-16 NOTE — Patient Instructions (Signed)
Please continue all your same medications.  Follow with Dr Delton Coombes in 3 months or sooner if you have any problems.

## 2011-05-16 NOTE — Progress Notes (Signed)
Morgan Hays is a 70 year old woman who follows up today for her COPD, dyspnea, and for a history of a RLL pulmonary nodule. 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 No sgy has been planned due to her significant lung disease. Also with hx depression.   ROV 02/21/11 -- Severe COPD on O2. On omeprazole + allergy regimen for cough. Last time we started trial daliresp - she couldn't tolerate, caused GI upset. She tell me that she is having some visual changes, lack of peripheral vision. She is seeing optho and a retinologist. Her breathing has been stable - has Class 2 -3 symptoms. Was treated with abx and pred in [place] 2 weeks ago. She describes some sleep disturbance but doesn't want a PSG.   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 Could not tolerate Daliresp. She has been rx with pred + abx x 1 since last visit by PCP. Currently at baseline, O2 is set on 3L/min. She does have some wheezing. Taking loratadine and fluticasone every day.    PE:  Gen: Pleasant, obese, in no distress,  Obese female.   ENT: No lesions,  mouth clear,  oropharynx clear, no postnasal drip  Neck: No JVD, no TMG, no carotid bruits  Lungs: Coarse BS w/ no wheezing, very distant  Cardiovascular: RRR, heart sounds normal, no murmur or gallops, trace edema  Musculoskeletal: No deformities, no cyanosis or clubbing  Neuro: alert, non focal  Skin: Warm, no lesions or rashes  Abd: obese,soft, NT, no guarding or rebound. Neg CVA tenderness    C O P D Continue same regimen.   ALLERGIC RHINITIS Continue same regimen

## 2011-05-16 NOTE — Assessment & Plan Note (Signed)
Continue same regimen 

## 2011-06-03 ENCOUNTER — Encounter: Payer: Self-pay | Admitting: *Deleted

## 2011-06-09 ENCOUNTER — Ambulatory Visit
Admission: RE | Admit: 2011-06-09 | Discharge: 2011-06-09 | Disposition: A | Discharge status: Home / Self Care | Payer: Medicare Other | Source: Ambulatory Visit | Attending: Cardiothoracic Surgery | Admitting: Cardiothoracic Surgery

## 2011-06-09 ENCOUNTER — Ambulatory Visit (INDEPENDENT_AMBULATORY_CARE_PROVIDER_SITE_OTHER): Payer: Medicare Other | Admitting: Cardiothoracic Surgery

## 2011-06-09 ENCOUNTER — Encounter: Payer: Self-pay | Admitting: Cardiothoracic Surgery

## 2011-06-09 VITALS — BP 159/80 | HR 84 | Resp 20 | Ht 64.0 in | Wt 177.0 lb

## 2011-06-09 DIAGNOSIS — I77810 Thoracic aortic ectasia: Secondary | ICD-10-CM

## 2011-06-09 DIAGNOSIS — I712 Thoracic aortic aneurysm, without rupture: Secondary | ICD-10-CM

## 2011-06-09 MED ORDER — IOHEXOL 300 MG/ML  SOLN
100.0000 mL | Freq: Once | INTRAMUSCULAR | Status: AC | PRN
  Administered 2011-06-09: 100 mL via INTRAVENOUS

## 2011-06-09 NOTE — Patient Instructions (Signed)
Keep up with Blood Pressure control Repeat CT scan 1yearThoracic      Aortic Aneurysm An aneurysm is the enlargement (dilatation), bulging or ballooning out of part of the wall of a vein or artery. An Aortic Aneurysm is a bulging in the largest artery of the body. This artery supplies blood from the heart to the rest of the body. The first part of the aorta is called the thoracic aorta. It leaves the heart, ascends (rises), arches, and descends (goes down) through the chest until it reaches the diaphragm (the muscular partition between the chest and abdomen (belly). The second part of the aorta is then called the abdominal aorta after it has passed the diaphragm and continues down through the abdomen. The abdominal aorta ends where it splits to form the two iliac arteries that go to the legs. Aortic aneurysms can develop anywhere along the length of the aorta. A thoracic aortic aneurysm (TAA) occurs in the first part of the aorta, between the heart and the diaphragm. The major importance of an aneurysm is that it can rupture or tear (dissect), causing death unless diagnosed and treated promptly. CAUSES   Most thoracic aortic aneurysms are related to arteriosclerosis. The arteriosclerosis can weaken the aortic wall. The pressure of the blood being pumped through the aorta causes it to balloon out at the site of weakness. Therefore, elevated blood pressure (hypertension) is associated with aneurysm. Other risk factors include:  Age over 58.     Tobacco use.     Female sex.     Family history of aneurysm.  Additional causes of thoracic aortic aneurysms include:  Genetics (passed by birth).     Injury: After physical trauma to the aorta.     Inflammation of blood vessels.     Hardening of the arteries.     Infection.  SYMPTOMS  Many aneurysms do not cause problems. A small, unchanging or slowly changing aneurysm may produce no symptoms until it suddenly ruptures or dissects (separation of  the layers of the aortic wall) without warning. It may then cause death. The symptoms (problems) of a developing aneurysm will partly depend on its size and rate of growth. Thoracic aortic aneurysms may cause pain in the:  Chest.     Back.    Sides.    Abdomen.  The pain most often has a deep quality as if it is boring into the person. It may cause:  Heart failure.     Heart attack.     Hoarseness.    Cough.    Shortness of breath.     Swallowing problems.  DIAGNOSIS   A thoracic aortic aneurysm may be suspected based on your symptoms. It may also be detected by x-ray or CT studies done for unrelated reasons.   Several different imaging studies can be used to confirm a TAA:  An echocardiogram is an ultrasound test to examine the heart. It can also examine the first parts of the aorta. Sometimes, this test is done by putting you to sleep and inserting a flexible telescope through your mouth into your esophagus, which is next to the aorta; excellent pictures of the aorta can be obtained. This is called a transesophageal echocardiogram (TEE).     CT scanning of the chest is accurate at showing the exact size and shape of the aneurysm.     MRI scanning is accurate, and is used for certain types of TAA.     An aortic angiogram shows the source of  the major blood vessels arising from the aorta. It reveals the size and extent of any aneurysm. It can also show a clot clinging to the wall of the aneurysm (mural thrombus). The angiogram may give information about a tear of the aorta.  TREATMENT   Treatment for a thoracic aortic aneurysm depends on:  Location.     Size.    Other factors.     Rate of growth.     Underlying cause.  Medical treatment is used for smaller or complicated aneurysms, or those that do not cause symptoms. These include:  Stopping smoking.     Blood pressure control.     Control of cholesterol.  Surgical treatment is used for aneurysms that cause  symptoms, or for those that are large or growing in size. The surgical technique depends on the location of the aneurysm. HOME CARE INSTRUCTIONS    If you smoke, stop. If you do not, do not start.     Take all medications as prescribed.     Your caregiver will tell you when to have your aneurysm rechecked, either by echocardiogram or CT scan. Be sure to keep this and all follow-up appointments.  SEEK MEDICAL CARE IF:    You develop mild pain in your chest, upper back, sides, or abdomen.     You develop cough, hoarseness or trouble swallowing.  SEEK IMMEDIATE MEDICAL CARE IF:    You develop severe chest or abdominal pain, or severe pain moving (radiating) to your back.     You suddenly develop cold or blue toes or feet.     You suddenly develop lightheadedness or fainting spells.     You develop trouble breathing.  Document Released: 06/20/2005 Document Revised: 03/02/2011 Document Reviewed: 05/09/2007 [facility] Patient Information 2012 [place], [place].

## 2011-06-09 NOTE — Progress Notes (Signed)
Patient ID: Morgan Hays, female   DOB: Dec 12, 1940, 70 y.o.   MRN: [id number]                    [address]            [place] [address]          [phone]       Morgan Hays [facility] Medical Record #[id number] Date of Birth: [birth year]  Referring: Lewayne Bunting, MD Primary Care: Gerre Pebbles, [place], [place]  Chief Complaint:    Chief Complaint  Patient presents with  . Follow-up    6 month f/u with CTA Chest eval dilated ascending aorta     History of Present Illness:     Patient returns with followup CTs of the chest to evaluate the size of her dilated descending aorta. She notes that she's recently seen by pulmonary and cardiology. She notes that she's been making attempts at keeping her blood pressure controlled.  Current Activity/ Functional Status: Patient is very limited in her functional ability notes that she becomes short of breath walking less than 100 feet. His generalized weakness. Notes it should become short of breath just sweeping the floor in her house and is not engaged in much physical activity primarily secondary to shortness of breath.   Past Medical History  Diagnosis Date  . Supraventricular tachycardia   . MVP (mitral valve prolapse)   . Hypothyroid   . Hypertension   . Aortic aneurysm   . Pulmonary nodule   . Varicose vein   . Arthritis   . Depression   . COPD (chronic obstructive pulmonary disease)     Past Surgical History  Procedure Date  . Appendectomy 1956  . Total abdominal hysterectomy 1975  . Tubal ligation 1972  . Breast surgery     benign left breast cyst removed    History  Smoking status  . Former Smoker -- 1.0 packs/day for 50 years  . Types: Cigarettes  . Quit date: 07/05/1999  Smokeless tobacco  . Not on file   History  Alcohol Use: Not on file    History   Social History  . Marital Status: Married    Spouse Name: N/A    Number of Children: N/A  . Years of Education: N/A   Occupational History   . LPN    Social History Main Topics  . Smoking status: Former Smoker -- 1.0 packs/day for 50 years    Types: Cigarettes    Quit date: 07/05/1999  . Smokeless tobacco: Not on file  . Alcohol Use: Not on file  . Drug Use: Not on file  . Sexually Active: Not on file    Social History Narrative   Pt is married and lives with her husband. She works as an Public house manager.  She has not had any known TB exposures.  She is a former smoker.  Quit in 2001.  She has approx 50 pack year total history.  Does not use alcohol     Allergies  Allergen Reactions  . Daliresp (Roflumilast) Diarrhea  . Doxycycline Nausea And Vomiting    Current Outpatient Prescriptions  Medication Sig Dispense Refill  . albuterol (PROVENTIL HFA) 108 (90 BASE) MCG/ACT inhaler Inhale 2 puffs into the lungs every 6 (six) hours as needed.        [name] albuterol (PROVENTIL) (2.5 MG/3ML) 0.083% nebulizer solution Take 2.5 mg by nebulization 2 (two) times daily.        [name]  aspirin 81 MG tablet Take 81 mg by mouth daily.        . budesonide-formoterol (SYMBICORT) 160-4.5 MCG/ACT inhaler Inhale 2 puffs into the lungs 2 (two) times daily.        . butalbital-acetaminophen-caffeine (FIORICET WITH CODEINE) 50-325-40-30 MG per capsule Take 1 capsule by mouth every 6 (six) hours as needed.        . Calcium Carbonate (CALCIUM 500 PO) Take 1 capsule by mouth 2 (two) times daily.        . Cholecalciferol (VITAMIN D) 1000 UNITS capsule Take 1,000 Units by mouth daily.        . fluticasone (FLONASE) 50 MCG/ACT nasal spray Place 2 sprays into the nose daily.  16 g  5  . levothyroxine (SYNTHROID, LEVOTHROID) 125 MCG tablet Take 125 mcg by mouth daily.        [name] loratadine (CLARITIN) 10 MG tablet Take 10 mg by mouth daily.        [name] LORazepam (ATIVAN) 1 MG tablet Take 1 tablet (1 mg total) by mouth 2 (two) times daily.  15 tablet  0  . magnesium oxide (MAG-OX) 400 MG tablet Take 400 mg by mouth daily.        [name] olmesartan-hydrochlorothiazide (BENICAR HCT)  40-12.5 MG per tablet Take 1 tablet by mouth daily.  30 tablet  12  . omeprazole (PRILOSEC) 20 MG capsule Take 1 capsule (20 mg total) by mouth daily.  30 capsule  5  . ondansetron (ZOFRAN) 4 MG tablet Take 1 tablet (4 mg total) by mouth every 8 (eight) hours as needed for nausea.  20 tablet  0  . pravastatin (PRAVACHOL) 40 MG tablet Take 40 mg by mouth daily.        . sertraline (ZOLOFT) 50 MG tablet Take 100 mg by mouth 2 (two) times daily.       [name] tiotropium (SPIRIVA) 18 MCG inhalation capsule Place 18 mcg into inhaler and inhale daily.        . traZODone (DESYREL) 50 MG tablet Take 50 mg by mouth at bedtime.             Family History  Problem Relation Age of Onset  . Heart disease Father   . Heart disease Son   . Heart disease Mother   . Heart disease Other     grandfather      Review of Systems:     Cardiac Review of Systems: Y or N  Chest Pain [  n  ]  Resting SOB [ y  ] Exertional SOB  [name]  ]  Orthopnea [name]  ]   Pedal Edema [name]   ]    Palpitations [name]  ] Syncope  [name]  ]   Presyncope [ n  ]  General Review of Systems: [Y] = yes [  ]=no Constitional: recent weight change [ n ]; anorexia [  ]; fatigue [ y ]; nausea [  ]; night sweats [  ]; fever [ n ]; or chills [ n ];  Dental: poor dentition[  ];  Eye : blurred vision [  ]; diplopia [   ]; vision changes [  ];  Amaurosis fugax[  ]; Resp: cough [ y];  wheezing[ y ];  hemoptysis[n  ]; shortness of breath[y  ]; paroxysmal nocturnal dyspnea[  ]; dyspnea on exertion[ y ]; or orthopnea[  y];  GI:  gallstones[  ], vomiting[  ];  dysphagia[  ]; melena[  ];  hematochezia [  ]; heartburn[  ];   Hx of  Colonoscopy[  ]; GU: kidney stones [  ]; hematuria[  ];   dysuria [  ];  nocturia[  ];  history of     obstruction [  ];             Skin: rash, swelling[  ];, hair loss[  ];  peripheral edema[  ];  or  itching[  ]; Musculosketetal: myalgias[  ];  joint swelling[  ];  joint erythema[  ];  joint pain[  ];  back pain[  ];  Heme/Lymph: bruising[  ];  bleeding[  ];  anemia[  ];  Neuro: TIA[  ];  headaches[  ];  stroke[  ];  vertigo[  ];  seizures[  ];   paresthesias[  ];  difficulty walking[  ];  Psych:depression[  ]; anxiety[  ];  Endocrine: diabetes[  ];  thyroid dysfunction[  ];  Immunizations: Flu [  ]; Pneumococcal[  ];  Other:  Physical Exam: BP 159/80  Pulse 84  Resp 20  Ht 5\' 4"  (1.626 m)  Wt 177 lb (80.287 kg)  BMI 30.38 kg/m2  SpO2 96%  General appearance: alert, appears older than stated age, no distress, moderately obese and Short of breath at rest on oxygen in the office. She gets short of breath walking from the lobby to the exam room Neurologic: intact Heart: regular rate and rhythm, S1, S2 normal, no murmur, click, rub or gallop Lungs: diminished breath sounds bilaterally Abdomen: soft, non-tender; bowel sounds normal; no masses,  no organomegaly Extremities: extremities normal, atraumatic, no cyanosis or edema and Homans sign is negative, no sign of DVT   Diagnostic Studies & Laboratory data:     Recent Radiology Findings:   Ct Angio Chest W/cm &/or Wo Cm  06/09/2011  *RADIOLOGY REPORT*  Clinical Data:  Evaluate thoracic aneurysm.  CT ANGIOGRAPHY CHEST WITH CONTRAST  Technique:  Multidetector CT imaging of the chest was performed using the standard protocol during bolus administration of intravenous contrast.  Multiplanar CT image reconstructions including MIPs were obtained to evaluate the vascular anatomy.  Contrast: OMNIPAQUE IOHEXOL 300 MG/ML IV SOLN  Comparison:  prior examinations from 11/09/2010 and 11/19/2009.  Findings:  The chest wall is unremarkable and stable.  No breast masses, supraclavicular or axillary lymphadenopathy.  Small stable right breast nodule is noted.  The bony thorax is intact.  The heart is normal in size.  No pericardial effusion.  No  mediastinal or hilar lymphadenopathy.  Stable coronary artery calcifications.  There is fusiform aneurysmal dilatation of the ascending aorta.  At the level of the right main pulmonary artery the aorta measures a maximum of 5.1 x 5.2 cm.  This is stable when measuring at the same level on the prior examinations.  No dissection.  Moderate atherosclerotic calcifications involving the aortic arch and descending thoracic aorta.  Stable aneurysmal dilatation of the descending thoracic aorta with maximal measurement of 4 cm on image number 40.  The pulmonary arteries appear normal.  No  obvious pulmonary emboli. The esophagus is grossly normal.  Examination of the lung parenchyma demonstrates stable emphysematous changes.  No worrisome mass lesions or nodules.  No acute pulmonary findings.  No pleural effusion.  The upper abdomen is unremarkable.  There is diffuse fatty infiltration of the liver noted.  Review of the MIP images confirms the above findings.  IMPRESSION:  1.  Stable fusiform aneurysmal dilatation of the ascending aorta with maximal measurements of 5.1 x 5.2 cm as described above. 2.  Stable tortuosity and ectasia of the descending thoracic aorta and stable atherosclerotic changes.  Maximal measurement is 4 cm. 3.  No findings for aortic dissection. 4.  Stable coronary artery calcifications. 5.  Stable emphysematous changes.  No acute pulmonary findings.  Original Report Authenticated By: P. Loralie Champagne, M.D.      Recent Lab Findings: Lab Results  Component Value Date   WBC 7.8 11/29/2006   HGB 12.3 11/29/2006   HCT 36.4 11/29/2006   PLT 313 11/29/2006   GLUCOSE 76 05/31/2010   CHOL 145 05/31/2010   TRIG 119.0 05/31/2010   HDL 46.60 05/31/2010   LDLCALC 75 05/31/2010   ALT 19 05/31/2010   AST 22 05/31/2010   NA 139 05/31/2010   K 4.3 05/31/2010   CL 96 05/31/2010   CREATININE 0.8 05/31/2010   BUN 14 05/31/2010   CO2 33* 05/31/2010   TSH 1.71 09/17/2007   INR 1.1 RATIO 11/29/2006       Assessment / Plan:     Patient is a 70 year old female with severe underlying pulmonary disease who returns for followup CT of the chest. She has a known ascending aortic aneurysm at approximately 5 cm. Reviewing the scans from 2008 there's been just very slight change. With her significant underlying pulmonary disease and her desire not to have any major invasive procedures done I recommended that we continue with observation and blood pressure control. She is aware of the risk of sudden dissection and/or rupture. I'll plan to see her back in one year with a followup CT scan.       Delight Ovens MD 06/09/2011 10:58 AM

## 2011-06-13 ENCOUNTER — Other Ambulatory Visit: Payer: Self-pay | Admitting: Cardiology

## 2011-07-11 DIAGNOSIS — H26499 Other secondary cataract, unspecified eye: Secondary | ICD-10-CM | POA: Diagnosis not present

## 2011-07-11 DIAGNOSIS — H538 Other visual disturbances: Secondary | ICD-10-CM | POA: Diagnosis not present

## 2011-07-12 ENCOUNTER — Other Ambulatory Visit: Payer: Self-pay | Admitting: Emergency Medicine

## 2011-07-12 DIAGNOSIS — R05 Cough: Secondary | ICD-10-CM

## 2011-07-12 MED ORDER — OMEPRAZOLE 20 MG PO CPDR: 20.0000 mg | DELAYED_RELEASE_CAPSULE | Freq: Every day | ORAL | Status: DC

## 2011-07-13 DIAGNOSIS — R7989 Other specified abnormal findings of blood chemistry: Secondary | ICD-10-CM | POA: Diagnosis not present

## 2011-07-13 DIAGNOSIS — J449 Chronic obstructive pulmonary disease, unspecified: Secondary | ICD-10-CM | POA: Diagnosis not present

## 2011-07-13 DIAGNOSIS — F329 Major depressive disorder, single episode, unspecified: Secondary | ICD-10-CM | POA: Diagnosis not present

## 2011-07-13 DIAGNOSIS — R6889 Other general symptoms and signs: Secondary | ICD-10-CM | POA: Diagnosis not present

## 2011-07-26 ENCOUNTER — Ambulatory Visit (INDEPENDENT_AMBULATORY_CARE_PROVIDER_SITE_OTHER): Payer: Medicare Other | Admitting: Emergency Medicine

## 2011-07-26 ENCOUNTER — Encounter: Payer: Self-pay | Admitting: Emergency Medicine

## 2011-07-26 VITALS — BP 132/72 | HR 101 | Temp 98.0°F | Ht 65.0 in | Wt 176.0 lb

## 2011-07-26 DIAGNOSIS — J449 Chronic obstructive pulmonary disease, unspecified: Secondary | ICD-10-CM

## 2011-07-26 DIAGNOSIS — J4489 Other specified chronic obstructive pulmonary disease: Secondary | ICD-10-CM

## 2011-07-26 NOTE — Patient Instructions (Signed)
Continue your inhaled medications and your oxygen as you are taking them Follow with Dr Delton Coombes in 3 months or sooner if you have any problems.

## 2011-07-26 NOTE — Assessment & Plan Note (Signed)
No clinical change, will continue same regimen and see her back in 3 months

## 2011-07-26 NOTE — Progress Notes (Signed)
Morgan Hays is a 71 year old woman who follows up today for her COPD, dyspnea, and for a history of a RLL pulmonary nodule. 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 No sgy has been planned due to her significant lung disease. Also with hx depression.   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 Could not tolerate Daliresp. She has been rx with pred + abx x 1 since last visit by PCP. Currently at baseline, O2 is set on 3L/min. She does have some wheezing. Taking loratadine and fluticasone every day.   ROV 07/26/11 -- Severe COPD on O2, chronic cough being rx for allergies and GERD. She is here for regular f/u. She mentions today that she is having some cyanosis, coolness and numbness in her feet for the last 5 -6 months. No w/u done yet, but she is going to mention to her PCP. Breathing has been stable, no flares since last visit. Taking Spiriva + Symbicort + SABA; uses albuterol at least once a day.    PE:  Gen: Pleasant, obese, in no distress,  Obese female.   ENT: No lesions,  mouth clear,  oropharynx clear, no postnasal drip  Neck: No JVD, no TMG, no carotid bruits  Lungs: Coarse BS w/ no wheezing, very distant  Cardiovascular: RRR, heart sounds normal, no murmur or gallops, trace edema  Musculoskeletal: No deformities, no cyanosis or clubbing  Neuro: alert, non focal  Skin: Warm, no lesions or rashes  Abd: obese,soft, NT, no guarding or rebound. Neg CVA tenderness    C O P D No clinical change, will continue same regimen and see her back in 3 months

## 2011-08-16 DIAGNOSIS — E782 Mixed hyperlipidemia: Secondary | ICD-10-CM | POA: Diagnosis not present

## 2011-08-16 DIAGNOSIS — I10 Essential (primary) hypertension: Secondary | ICD-10-CM | POA: Diagnosis not present

## 2011-08-16 DIAGNOSIS — E038 Other specified hypothyroidism: Secondary | ICD-10-CM | POA: Diagnosis not present

## 2011-08-16 DIAGNOSIS — J069 Acute upper respiratory infection, unspecified: Secondary | ICD-10-CM | POA: Diagnosis not present

## 2011-08-16 DIAGNOSIS — F329 Major depressive disorder, single episode, unspecified: Secondary | ICD-10-CM | POA: Diagnosis not present

## 2011-08-16 DIAGNOSIS — J449 Chronic obstructive pulmonary disease, unspecified: Secondary | ICD-10-CM | POA: Diagnosis not present

## 2011-09-09 DIAGNOSIS — J441 Chronic obstructive pulmonary disease with (acute) exacerbation: Secondary | ICD-10-CM | POA: Diagnosis not present

## 2011-10-10 DIAGNOSIS — M25569 Pain in unspecified knee: Secondary | ICD-10-CM | POA: Diagnosis not present

## 2011-10-10 DIAGNOSIS — M25469 Effusion, unspecified knee: Secondary | ICD-10-CM | POA: Diagnosis not present

## 2011-10-31 ENCOUNTER — Ambulatory Visit (INDEPENDENT_AMBULATORY_CARE_PROVIDER_SITE_OTHER): Payer: Medicare Other | Admitting: Emergency Medicine

## 2011-10-31 ENCOUNTER — Encounter: Payer: Self-pay | Admitting: Emergency Medicine

## 2011-10-31 ENCOUNTER — Telehealth: Payer: Self-pay | Admitting: Emergency Medicine

## 2011-10-31 VITALS — BP 120/72 | HR 114 | Temp 97.9°F | Ht 65.0 in | Wt 185.6 lb

## 2011-10-31 DIAGNOSIS — J309 Allergic rhinitis, unspecified: Secondary | ICD-10-CM

## 2011-10-31 DIAGNOSIS — J449 Chronic obstructive pulmonary disease, unspecified: Secondary | ICD-10-CM | POA: Diagnosis not present

## 2011-10-31 MED ORDER — CETIRIZINE HCL 10 MG PO CHEW: 10.0000 mg | CHEWABLE_TABLET | Freq: Every day | ORAL | Status: DC

## 2011-10-31 MED ORDER — CETIRIZINE HCL 10 MG PO TABS: 10.0000 mg | ORAL_TABLET | Freq: Every day | ORAL | Status: AC

## 2011-10-31 MED ORDER — TIOTROPIUM BROMIDE MONOHYDRATE 18 MCG IN CAPS: 18.0000 ug | ORAL_CAPSULE | Freq: Every day | RESPIRATORY_TRACT | Status: DC

## 2011-10-31 MED ORDER — ALBUTEROL SULFATE HFA 108 (90 BASE) MCG/ACT IN AERS: 2.0000 | INHALATION_SPRAY | Freq: Four times a day (QID) | RESPIRATORY_TRACT | Status: DC | PRN

## 2011-10-31 MED ORDER — BUDESONIDE-FORMOTEROL FUMARATE 160-4.5 MCG/ACT IN AERO: 2.0000 | INHALATION_SPRAY | Freq: Two times a day (BID) | RESPIRATORY_TRACT | Status: DC

## 2011-10-31 NOTE — Assessment & Plan Note (Signed)
NSW's Change loratadine to zyrtec Add benadryl at night

## 2011-10-31 NOTE — Progress Notes (Signed)
Morgan Hays is a 71 year old woman who follows up today for her COPD, dyspnea, and for a history of a RLL pulmonary nodule. 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 No sgy has been planned due to her significant lung disease. Also with hx depression.   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 Could not tolerate Daliresp. She has been rx with pred + abx x 1 since last visit by PCP. Currently at baseline, O2 is set on 3L/min. She does have some wheezing. Taking loratadine and fluticasone every day.   ROV 07/26/11 -- Severe COPD on O2, chronic cough being rx for allergies and GERD. She is here for regular f/u. She mentions today that she is having some cyanosis, coolness and numbness in her feet for the last 5 -6 months. No w/u done yet, but she is going to mention to her PCP. Breathing has been stable, no flares since last visit. Taking Spiriva + Symbicort + SABA; uses albuterol at least once a day.   ROV 10/31/11 -- Severe COPD on O2, chronic cough being rx for allergies and GERD. Having more nasal sx, gtt, wheezing.  She has not had flare since last time.  Interested in Research scientist (medical).    PE:  Gen: Pleasant, obese, in no distress,  Obese female.   ENT: No lesions,  mouth clear,  oropharynx clear, no postnasal drip  Neck: No JVD, no TMG, no carotid bruits  Lungs: Coarse BS w/ no wheezing, very distant  Cardiovascular: RRR, heart sounds normal, no murmur or gallops, trace edema  Musculoskeletal: No deformities, no cyanosis or clubbing  Neuro: alert, non focal  Skin: Warm, no lesions or rashes  Abd: obese,soft, NT, no guarding or rebound. Neg CVA tenderness    C O P D - same BD's + O2 - will try to get a portable concentrator  ALLERGIC RHINITIS [facility]'s Change loratadine to zyrtec Add benadryl at night

## 2011-10-31 NOTE — Telephone Encounter (Signed)
I spoke with [facility] and is wanting to change the zyrtec to tablets instead of the chewables bc it's to expensive for the pt. I advised it was fine. I have updated med list and will sign off message

## 2011-10-31 NOTE — Patient Instructions (Signed)
Continue your inhaled medications as you are taking them Wear your O2. We will check to see if you can get a portable concentrator that is light weight Stop Claritin and start Zyrtec daily Start doing nasal saline washes every day Follow with Dr Delton Coombes in 3 months or sooner if you have any problems.

## 2011-10-31 NOTE — Assessment & Plan Note (Signed)
-   same BD's + O2 - will try to get a portable concentrator

## 2011-11-14 DIAGNOSIS — Z79899 Other long term (current) drug therapy: Secondary | ICD-10-CM | POA: Diagnosis not present

## 2011-11-14 DIAGNOSIS — J449 Chronic obstructive pulmonary disease, unspecified: Secondary | ICD-10-CM | POA: Diagnosis not present

## 2011-11-14 DIAGNOSIS — E038 Other specified hypothyroidism: Secondary | ICD-10-CM | POA: Diagnosis not present

## 2011-11-14 DIAGNOSIS — J209 Acute bronchitis, unspecified: Secondary | ICD-10-CM | POA: Diagnosis not present

## 2011-11-14 DIAGNOSIS — F329 Major depressive disorder, single episode, unspecified: Secondary | ICD-10-CM | POA: Diagnosis not present

## 2011-11-14 DIAGNOSIS — I10 Essential (primary) hypertension: Secondary | ICD-10-CM | POA: Diagnosis not present

## 2011-11-14 DIAGNOSIS — E782 Mixed hyperlipidemia: Secondary | ICD-10-CM | POA: Diagnosis not present

## 2011-12-12 ENCOUNTER — Encounter: Payer: Self-pay | Admitting: *Deleted

## 2011-12-12 ENCOUNTER — Ambulatory Visit (INDEPENDENT_AMBULATORY_CARE_PROVIDER_SITE_OTHER): Payer: Medicare Other | Admitting: Physician Assistant

## 2011-12-12 ENCOUNTER — Encounter: Payer: Self-pay | Admitting: Physician Assistant

## 2011-12-12 VITALS — BP 128/62 | HR 104 | Ht 65.0 in | Wt 186.0 lb

## 2011-12-12 DIAGNOSIS — R0602 Shortness of breath: Secondary | ICD-10-CM

## 2011-12-12 DIAGNOSIS — R079 Chest pain, unspecified: Secondary | ICD-10-CM

## 2011-12-12 DIAGNOSIS — I10 Essential (primary) hypertension: Secondary | ICD-10-CM

## 2011-12-12 DIAGNOSIS — I2510 Atherosclerotic heart disease of native coronary artery without angina pectoris: Secondary | ICD-10-CM

## 2011-12-12 DIAGNOSIS — I712 Thoracic aortic aneurysm, without rupture: Secondary | ICD-10-CM | POA: Diagnosis not present

## 2011-12-12 NOTE — Progress Notes (Signed)
[address] [place], [place]  [address] Phone: [phone] Fax:  [phone]  Date:  12/12/2011   Name:  Morgan Hays   DOB:  [birth year]   MRN:  [id number]  PCP:  Gerre Pebbles, PA, PA  Primary Cardiologist:  Dr. Olga Millers  Primary Electrophysiologist:  None    History of Present Illness: Morgan Hays is a 71 y.o. female who returns for eval of CP.  She has a h/o thoracic aneurysm, severe COPD, and paroxysmal atrial tachycardia.  A Myoview performed on September 27, 2007 revealed normal LV function with an estimated ejection fraction of 78%.  There was normal perfusion.  She also had an echocardiogram in Nov 2010, that showed normal LV function, grade 1 diastolic dysfunction, aortic aneurysm, mild AI and mild to moderate MR. She also has a history of suicidal ideation. Her last CT scan was performed in 04/2011 and revealed stable aneurysm of the ascending aorta measuring 5x1 x 5.2 cm.  She is followed by Dr. Tyrone Sage for this issue.  Note her previous CAT scan also revealed calcium in her coronary arteries.  Last seen by Dr. Jens Som 05/2011.  Continued risk factor modification was pursued.  Her blood pressure medication was adjusted.  Plans for follow up in one year.  She did see Dr. Tyrone Sage 06/2011.  Her aneurysm was fairly stable.  Continued observation blood pressure control was recommended and he planned to see her back in one year as well.  She had 2 episodes of chest pain about one to one and a half months ago.  This was substernal and sharp.  It radiated up to her right jaw and down her right arm.  It lasted maybe 5 minutes.  She took 2 aspirin with some relief.  She denies exertional discomfort.  Her chest pain occurred while at rest.  She denies associated nausea but did feel diaphoretic.  She has chronic dyspnea.  She is unsure whether or not she had worsening dyspnea.  She denies syncope.  She has chronic two-pillow orthopnea.  She denies PND.  She denies  significant pedal edema.  Wt Readings from Last 3 Encounters:  12/12/11 186 lb (84.369 kg)  10/31/11 185 lb 9.6 oz (84.188 kg)  07/26/11 176 lb (79.833 kg)     Potassium  Date/Time Value Range Status  05/31/2010 12:01 PM 4.3  3.5-5.1 (meq/L) Final     Creatinine, Ser  Date/Time Value Range Status  05/31/2010 12:01 PM 0.8  0.4-1.2 (mg/dL) Final     ALT  Date/Time Value Range Status  05/31/2010 12:01 PM 19  0-35 (units/L) Final     TSH  Date/Time Value Range Status  09/17/2007  3:22 PM 1.71  0.35-5.50 (microintl units/mL) Final     Hemoglobin  Date/Time Value Range Status  11/29/2006  2:59 PM 12.3  12.0-15.0 (g/dL) Final    Past Medical History  Diagnosis Date  . Supraventricular tachycardia   . MVP (mitral valve prolapse)   . Hypothyroid   . Hypertension   . Aortic aneurysm   . Pulmonary nodule   . Varicose vein   . Arthritis   . Depression   . COPD (chronic obstructive pulmonary disease)     Current Outpatient Prescriptions  Medication Sig Dispense Refill  . albuterol (PROVENTIL HFA) 108 (90 BASE) MCG/ACT inhaler Inhale 2 puffs into the lungs every 6 (six) hours as needed.  1 Inhaler  5  . albuterol (PROVENTIL) (2.5 MG/3ML) 0.083% nebulizer solution Take 2.5 mg by nebulization  2 (two) times daily.        [name] aspirin 81 MG tablet Take 81 mg by mouth daily.        . budesonide-formoterol (SYMBICORT) 160-4.5 MCG/ACT inhaler Inhale 2 puffs into the lungs 2 (two) times daily.  1 Inhaler  5  . butalbital-acetaminophen-caffeine (FIORICET WITH CODEINE) 50-325-40-30 MG per capsule Take 1 capsule by mouth every 6 (six) hours as needed.        . Calcium Carbonate (CALCIUM 500 PO) Take 1 capsule by mouth 2 (two) times daily.        . cetirizine (ZYRTEC) 10 MG tablet Take 1 tablet (10 mg total) by mouth daily.  30 tablet  11  . Cholecalciferol (VITAMIN D) 1000 UNITS capsule Take 1,000 Units by mouth daily.        . DULoxetine (CYMBALTA) 60 MG capsule Take 60 mg by mouth daily.       . fluticasone (FLONASE) 50 MCG/ACT nasal spray Place 2 sprays into the nose daily.  16 g  5  . ibuprofen (ADVIL,MOTRIN) 800 MG tablet Take 800 mg by mouth 2 (two) times daily. For knee pain      . levothyroxine (SYNTHROID, LEVOTHROID) 125 MCG tablet Take 125 mcg by mouth daily.        [name] loratadine (CLARITIN) 10 MG tablet Take 10 mg by mouth daily.        [name] LORazepam (ATIVAN) 1 MG tablet Take 1 tablet (1 mg total) by mouth 2 (two) times daily.  15 tablet  0  . magnesium oxide (MAG-OX) 400 MG tablet Take 400 mg by mouth daily.        [name] olmesartan-hydrochlorothiazide (BENICAR HCT) 40-12.5 MG per tablet Take 1 tablet by mouth daily.  30 tablet  12  . omeprazole (PRILOSEC) 20 MG capsule Take 1 capsule (20 mg total) by mouth daily.  30 capsule  5  . ondansetron (ZOFRAN) 4 MG tablet Take 1 tablet (4 mg total) by mouth every 8 (eight) hours as needed for nausea.  20 tablet  0  . pravastatin (PRAVACHOL) 40 MG tablet TAKE ONE TABLET BY MOUTH DAILY AT BEDTIME.  30 tablet  12  . tiotropium (SPIRIVA) 18 MCG inhalation capsule Place 1 capsule (18 mcg total) into inhaler and inhale daily.  30 capsule  5  . traZODone (DESYREL) 50 MG tablet Take 50 mg by mouth at bedtime.         Allergies: Allergies  Allergen Reactions  . Daliresp (Roflumilast) Diarrhea  . Doxycycline Nausea And Vomiting    History  Substance Use Topics  . Smoking status: Former Smoker -- 1.0 packs/day for 50 years    Types: Cigarettes    Quit date: 07/05/1999  . Smokeless tobacco: Not on file  . Alcohol Use: Not on file     ROS:  Please see the history of present illness.   She does note some pill dysphagia at times.  She does not feel that her chest pain was related to meals.  She denies significant indigestion.  She denies melena or hematochezia.  She denies vomiting or diarrhea.  All other systems reviewed and negative.   PHYSICAL EXAM: VS:  BP 128/62  Pulse 104  Ht 5\' 5"  (1.651 m)  Wt 186 lb (84.369 kg)  BMI 30.95  kg/m2 Well nourished, well developed, in no acute distress HEENT: normal Neck: no JVD Cardiac:  normal S1, S2; RRR; no murmur Lungs:  Decreased breath sounds bilaterally, no wheezing, rhonchi or rales  Abd: soft, nontender, no hepatomegaly Ext: no edema Skin: warm and dry Neuro:  CNs 2-12 intact, no focal abnormalities noted  EKG:  Sinus tachycardia, heart rate 104, normal axis, no significant change compared to prior tracings   ASSESSMENT AND PLAN:  1.  Chest Pain Atypical symptoms. She does have coronary calcification on chest CT and it has been 4 years since her last assessment for ischemia. Arange stress Myoview. Also arrange CT angiogram of the chest given her history of aortic aneurysm and recent chest symptoms. Followup with Dr. Jens Som in one month.  2.  Coronary Artery Disease Continue aspirin and statin. Proceed with stress Myoview as noted.  3.  Hypertension Controlled.  Continue current therapy.   4.  Ascending Thoracic Aortic Aneurysm If her chest CT demonstrates significant changes, refer her back to Dr. Tyrone Sage for early followup.  5.  COPD Managed by pulmonary.   Signed, Tereso Newcomer, PA-C  2:23 PM 12/12/2011

## 2011-12-12 NOTE — Patient Instructions (Signed)
Your physician recommends that you schedule a follow-up appointment in: 1 MONTH WITH DR. CRENSHAW  BMET TODAY  Your physician has requested that you have a lexiscan myoview DX CHEST PAIN. For further information please visit [email or web address] Please follow instruction sheet, as given.  PLEASE MAKE APPOINTMENT TO HAVE A CARDIAC CT ANGIO DONE AT [facility] DX CHEST PAIN AND ASCENDING AORTIC ANEURYSM

## 2011-12-13 LAB — BASIC METABOLIC PANEL
BUN: 16 mg/dL (ref 6–23)
CO2: 35 mEq/L — ABNORMAL HIGH (ref 19–32)
Calcium: 9.1 mg/dL (ref 8.4–10.5)
Creatinine, Ser: 0.9 mg/dL (ref 0.4–1.2)

## 2011-12-19 ENCOUNTER — Ambulatory Visit (HOSPITAL_COMMUNITY): Payer: Medicare Other | Attending: Cardiology | Admitting: Radiology

## 2011-12-19 VITALS — BP 111/67 | Ht 65.0 in | Wt 185.0 lb

## 2011-12-19 DIAGNOSIS — R9431 Abnormal electrocardiogram [ECG] [EKG]: Secondary | ICD-10-CM

## 2011-12-19 DIAGNOSIS — I059 Rheumatic mitral valve disease, unspecified: Secondary | ICD-10-CM | POA: Insufficient documentation

## 2011-12-19 DIAGNOSIS — J4489 Other specified chronic obstructive pulmonary disease: Secondary | ICD-10-CM | POA: Insufficient documentation

## 2011-12-19 DIAGNOSIS — Z8249 Family history of ischemic heart disease and other diseases of the circulatory system: Secondary | ICD-10-CM | POA: Insufficient documentation

## 2011-12-19 DIAGNOSIS — I498 Other specified cardiac arrhythmias: Secondary | ICD-10-CM | POA: Insufficient documentation

## 2011-12-19 DIAGNOSIS — R0602 Shortness of breath: Secondary | ICD-10-CM | POA: Diagnosis not present

## 2011-12-19 DIAGNOSIS — R079 Chest pain, unspecified: Secondary | ICD-10-CM

## 2011-12-19 DIAGNOSIS — Z87891 Personal history of nicotine dependence: Secondary | ICD-10-CM | POA: Diagnosis not present

## 2011-12-19 DIAGNOSIS — I359 Nonrheumatic aortic valve disorder, unspecified: Secondary | ICD-10-CM | POA: Insufficient documentation

## 2011-12-19 DIAGNOSIS — E785 Hyperlipidemia, unspecified: Secondary | ICD-10-CM | POA: Diagnosis not present

## 2011-12-19 DIAGNOSIS — I712 Thoracic aortic aneurysm, without rupture: Secondary | ICD-10-CM

## 2011-12-19 DIAGNOSIS — I2510 Atherosclerotic heart disease of native coronary artery without angina pectoris: Secondary | ICD-10-CM

## 2011-12-19 DIAGNOSIS — J449 Chronic obstructive pulmonary disease, unspecified: Secondary | ICD-10-CM | POA: Diagnosis not present

## 2011-12-19 MED ORDER — REGADENOSON 0.4 MG/5ML IV SOLN
0.4000 mg | Freq: Once | INTRAVENOUS | Status: AC
  Administered 2011-12-19: 0.4 mg via INTRAVENOUS

## 2011-12-19 MED ORDER — TECHNETIUM TC 99M TETROFOSMIN IV KIT
10.0000 | PACK | Freq: Once | INTRAVENOUS | Status: AC | PRN
  Administered 2011-12-19: 10 via INTRAVENOUS

## 2011-12-19 MED ORDER — TECHNETIUM TC 99M TETROFOSMIN IV KIT
30.0000 | PACK | Freq: Once | INTRAVENOUS | Status: AC | PRN
  Administered 2011-12-19: 30 via INTRAVENOUS

## 2011-12-19 NOTE — Progress Notes (Signed)
[facility] SITE 3 NUCLEAR MED [address] [place] [address] [phone]  Cardiology Nuclear Med Study  Morgan Hays is a 71 y.o. female     MRN : [id number]     DOB: [birth year]  Procedure Date: 12/19/2011  Nuclear Med Background Indication for Stress Test:  Evaluation for Ischemia History:  COPD and '08 Ablation: SVT, 3/09 MPS: EF:78%,11/10 ECHO: EF; 55-60%, AA mild AI, mod MR, 10/12: CT: (+0 coronary calcification, AA: 5.2 , Thoracic AA: 4.0 Cardiac Risk Factors: Family History - CAD, History of Smoking and Lipids  Symptoms:  Chest Pain, SOB   Nuclear Pre-Procedure Caffeine/Decaff Intake:  None NPO After: 11:30pm   Lungs:  clear O2 Sat: 98% on 2 liters/min via Patient connected to nasal cannula oxygen. IV 0.9% NS with Angio Cath:  20g  IV Site: R Antecubital  IV Started by:  Stanton Kidney, EMT-P  Chest Size (in):  38 Cup Size: C  Height: 5\' 5"  (1.651 m)  Weight:  185 lb (83.915 kg)  BMI:  Body mass index is 30.79 kg/(m^2). Tech Comments:  O2 Sat= 94-95% on 2LPM via  at 09:20.    Nuclear Med Study 1 or 2 day study: 1 day  Stress Test Type:  Eugenie Birks  Reading MD: Olga Millers, MD  Order Authorizing Provider:  B.Jerik Falletta MD/ S.Weaver PA  Resting Radionuclide: Technetium 1m Tetrofosmin  Resting Radionuclide Dose: 11.0 mCi   Stress Radionuclide:  Technetium 59m Tetrofosmin  Stress Radionuclide Dose: 31.5 mCi           Stress Protocol Rest HR: 82 Stress HR: 102  Rest BP: 111/67 Stress BP: 125/63  Exercise Time (min): n/a METS: n/a   Predicted Max HR: 151 bpm % Max HR: 67.55 bpm Rate Pressure Product: 25366   Dose of Adenosine (mg):  n/a Dose of Lexiscan: 0.4 mg  Dose of Atropine (mg): n/a Dose of Dobutamine: n/a mcg/kg/min (at max HR)  Stress Test Technologist: Milana Na, EMT-P  Nuclear Technologist:  Domenic Polite, CNMT     Rest Procedure:  Myocardial perfusion imaging was performed at rest 45 minutes following the intravenous  administration of Technetium 82m Tetrofosmin. Rest ECG: NSR - Normal EKG  Stress Procedure:  The patient received IV Lexiscan 0.4 mg over 15-seconds.  Technetium 82m Tetrofosmin injected at 30-seconds.  There were no significant changes, + sob, and chest tightness with Lexiscan.  Quantitative spect images were obtained after a 45 minute delay. Stress ECG: No significant change from baseline ECG  QPS Raw Data Images:  Acquisition technically good; normal left ventricular size. Stress Images:  There is decreased uptake in the apex. Rest Images:  There is decreased uptake in the apex. Subtraction (SDS):  No evidence of ischemia. Transient Ischemic Dilatation (Normal <1.22):  0.92 Lung/Heart Ratio (Normal <0.45):  0.37  Quantitative Gated Spect Images QGS EDV:  60 ml QGS ESV:  12 ml  Impression Exercise Capacity:  Lexiscan with no exercise. BP Response:  Normal blood pressure response. Clinical Symptoms:  There is dyspnea. ECG Impression:  No significant ST segment change suggestive of ischemia. Comparison with Prior Nuclear Study: No images to compare  Overall Impression:  Normal stress nuclear study with a small, mild, fixed apical defect consistent with thinning; no ischemia.  LV Ejection Fraction: 79%.  LV Wall Motion:  NL LV Function; NL Wall Motion  Olga Millers

## 2011-12-22 ENCOUNTER — Telehealth: Payer: Self-pay | Admitting: Cardiology

## 2011-12-22 NOTE — Telephone Encounter (Signed)
New problem:  Test results. Pt will not be home from 3:45-4:15.

## 2011-12-22 NOTE — Telephone Encounter (Signed)
Spoke with pt, aware of nuclear results 

## 2011-12-23 ENCOUNTER — Ambulatory Visit (HOSPITAL_COMMUNITY)
Admission: RE | Admit: 2011-12-23 | Discharge: 2011-12-23 | Disposition: A | Discharge status: Home / Self Care | Payer: Medicare Other | Source: Ambulatory Visit | Attending: Cardiology | Admitting: Cardiology

## 2011-12-23 ENCOUNTER — Encounter (HOSPITAL_COMMUNITY): Payer: Self-pay

## 2011-12-23 DIAGNOSIS — R0602 Shortness of breath: Secondary | ICD-10-CM

## 2011-12-23 DIAGNOSIS — J438 Other emphysema: Secondary | ICD-10-CM | POA: Diagnosis not present

## 2011-12-23 DIAGNOSIS — R079 Chest pain, unspecified: Secondary | ICD-10-CM

## 2011-12-23 DIAGNOSIS — I712 Thoracic aortic aneurysm, without rupture, unspecified: Secondary | ICD-10-CM | POA: Insufficient documentation

## 2011-12-23 MED ORDER — IOHEXOL 350 MG/ML SOLN
160.0000 mL | Freq: Once | INTRAVENOUS | Status: AC | PRN
  Administered 2011-12-23: 160 mL via INTRAVENOUS

## 2011-12-23 MED ORDER — METOPROLOL TARTRATE 1 MG/ML IV SOLN
5.0000 mg | INTRAVENOUS | Status: DC | PRN
  Administered 2011-12-23 (×2): 5 mg via INTRAVENOUS

## 2011-12-23 MED ORDER — NITROGLYCERIN 0.4 MG SL SUBL
SUBLINGUAL_TABLET | SUBLINGUAL | Status: AC
  Administered 2011-12-23: 0.4 mg
  Filled 2011-12-23: qty 25

## 2011-12-23 MED ORDER — NITROGLYCERIN 0.4 MG SL SUBL
0.4000 mg | SUBLINGUAL_TABLET | SUBLINGUAL | Status: DC | PRN
  Administered 2011-12-23: 0.4 mg via SUBLINGUAL

## 2011-12-23 MED ORDER — METOPROLOL TARTRATE 1 MG/ML IV SOLN
INTRAVENOUS | Status: AC
  Administered 2011-12-23: 5 mg
  Filled 2011-12-23: qty 20

## 2012-01-13 DIAGNOSIS — M545 Low back pain: Secondary | ICD-10-CM | POA: Diagnosis not present

## 2012-01-19 DIAGNOSIS — Z961 Presence of intraocular lens: Secondary | ICD-10-CM | POA: Diagnosis not present

## 2012-01-27 ENCOUNTER — Other Ambulatory Visit: Payer: Self-pay | Admitting: Pulmonary Disease

## 2012-01-27 DIAGNOSIS — R05 Cough: Secondary | ICD-10-CM

## 2012-01-27 MED ORDER — OMEPRAZOLE 20 MG PO CPDR: 20.0000 mg | DELAYED_RELEASE_CAPSULE | Freq: Every day | ORAL | Status: DC

## 2012-02-01 ENCOUNTER — Encounter: Payer: Self-pay | Admitting: Emergency Medicine

## 2012-02-01 ENCOUNTER — Ambulatory Visit (INDEPENDENT_AMBULATORY_CARE_PROVIDER_SITE_OTHER): Payer: Medicare Other | Admitting: Emergency Medicine

## 2012-02-01 VITALS — BP 112/78 | HR 97 | Temp 97.5°F | Ht 65.0 in | Wt 186.8 lb

## 2012-02-01 DIAGNOSIS — J449 Chronic obstructive pulmonary disease, unspecified: Secondary | ICD-10-CM | POA: Diagnosis not present

## 2012-02-01 MED ORDER — BUDESONIDE-FORMOTEROL FUMARATE 160-4.5 MCG/ACT IN AERO: 2.0000 | INHALATION_SPRAY | Freq: Two times a day (BID) | RESPIRATORY_TRACT | Status: DC

## 2012-02-01 MED ORDER — TIOTROPIUM BROMIDE MONOHYDRATE 18 MCG IN CAPS: 18.0000 ug | ORAL_CAPSULE | Freq: Every day | RESPIRATORY_TRACT | Status: DC

## 2012-02-01 MED ORDER — ALBUTEROL SULFATE HFA 108 (90 BASE) MCG/ACT IN AERS: 2.0000 | INHALATION_SPRAY | RESPIRATORY_TRACT | Status: DC | PRN

## 2012-02-01 NOTE — Assessment & Plan Note (Signed)
Clinically stable - continue spiriva + symbicort - prn SABA - O2 3L?min-  - rov 4 mon

## 2012-02-01 NOTE — Progress Notes (Signed)
Morgan Hays is a 71 year old woman who follows up today for her COPD, dyspnea, and for a history of a RLL pulmonary nodule. 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 No sgy has been planned due to her significant lung disease. Also with hx depression.   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 Could not tolerate Daliresp. She has been rx with pred + abx x 1 since last visit by PCP. Currently at baseline, O2 is set on 3L/min. She does have some wheezing. Taking loratadine and fluticasone every day.   ROV 07/26/11 -- Severe COPD on O2, chronic cough being rx for allergies and GERD. She is here for regular f/u. She mentions today that she is having some cyanosis, coolness and numbness in her feet for the last 5 -6 months. No w/u done yet, but she is going to mention to her PCP. Breathing has been stable, no flares since last visit. Taking Spiriva + Symbicort + SABA; uses albuterol at least once a day.   ROV 10/31/11 -- Severe COPD on O2, chronic cough being rx for allergies and GERD. Having more nasal sx, gtt, wheezing.  She has not had flare since last time.  Interested in Research scientist (medical).   ROV 02/01/12 -- Severe COPD on O2, chronic cough being rx for allergies and GERD. Since last visit she has undergone Myoview for atypical CP, reassuring study. 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 CT chest > no evidence PE.  Breathing has been stable, still limiting her exertion. Rarely uses SABA.  Spiriva + Symbicort, O2 on 3L/min. She believes that stopping cymbalta has helped her. Some occasional cough.   PE:  Filed Vitals:   02/01/12 1422  BP: 112/78  Pulse: 97  Temp: 97.5 F (36.4 C)   Gen: Pleasant, obese, in no distress,  Obese female.   ENT: No lesions,  mouth clear,  oropharynx clear, no postnasal drip  Neck: No JVD, no TMG, no carotid bruits  Lungs: Coarse BS w/ no wheezing, very distant  Cardiovascular: RRR, heart sounds  normal, no murmur or gallops, trace edema  Musculoskeletal: No deformities, no cyanosis or clubbing  Neuro: alert, non focal  Skin: Warm, no lesions or rashes  Abd: obese,soft, NT, no guarding or rebound. Neg CVA tenderness     12/19/11 Myoview:  Overall Impression: Normal stress nuclear study with a small, mild, fixed apical defect consistent with thinning; no ischemia.  LV Ejection Fraction: 79%. LV Wall Motion: NL LV Function; NL Wall Motion   C O P D Clinically stable - continue spiriva + symbicort - prn SABA - O2 3L?min-  - rov 4 mon

## 2012-02-01 NOTE — Patient Instructions (Addendum)
Please continue your current medications as you are taking them Use your oxygen at all times.  Follow with Dr Delton Coombes in 4 months or sooner if you have any problems.

## 2012-02-02 ENCOUNTER — Ambulatory Visit (INDEPENDENT_AMBULATORY_CARE_PROVIDER_SITE_OTHER): Payer: Medicare Other | Admitting: Cardiology

## 2012-02-02 ENCOUNTER — Encounter: Payer: Self-pay | Admitting: Cardiology

## 2012-02-02 VITALS — BP 136/74 | HR 86 | Ht 65.0 in | Wt 187.4 lb

## 2012-02-02 DIAGNOSIS — I10 Essential (primary) hypertension: Secondary | ICD-10-CM

## 2012-02-02 DIAGNOSIS — I719 Aortic aneurysm of unspecified site, without rupture: Secondary | ICD-10-CM

## 2012-02-02 DIAGNOSIS — E785 Hyperlipidemia, unspecified: Secondary | ICD-10-CM | POA: Diagnosis not present

## 2012-02-02 DIAGNOSIS — Z8679 Personal history of other diseases of the circulatory system: Secondary | ICD-10-CM | POA: Diagnosis not present

## 2012-02-02 DIAGNOSIS — I2510 Atherosclerotic heart disease of native coronary artery without angina pectoris: Secondary | ICD-10-CM

## 2012-02-02 NOTE — Assessment & Plan Note (Signed)
Followed by [facility]. Not clear that she would ever be a candidate for aortic root replacement given COPD.

## 2012-02-02 NOTE — Patient Instructions (Addendum)
Your physician wants you to follow-up in: 9 MONTHS WITH DR CRENSHAW You will receive a reminder letter in the mail two months in advance. If you don't receive a letter, please call our office to schedule the follow-up appointment.  

## 2012-02-02 NOTE — Assessment & Plan Note (Signed)
Blood pressure controlled. Continue present medications. 

## 2012-02-02 NOTE — Assessment & Plan Note (Signed)
Continue statin. 

## 2012-02-02 NOTE — Assessment & Plan Note (Signed)
Continue aspirin and statin. Recent Myoview negative. 

## 2012-02-02 NOTE — Progress Notes (Signed)
HPI: [name] is a pleasant female with a thoracic aneurysm, severe COPD, CAD and paroxysmal atrial tachycardia. She had an echocardiogram in Nov 2010, that showed normal LV function, grade 1 diastolic dysfunction, aortic aneurysm, mild AI and mild to moderate MR. Last Myoview was performed in June of 2013. Her ejection fraction was 79% with apical thinning and no ischemia. Chest CTA in June of 2013 showed a thoracic aneurysm of 5.3 x 4.9 cm unchanged compared to previous. There was COPD and emphysema. She also has a history of suicidal ideation. She is followed by Dr. Tyrone Sage for her aneurysm.  Note her previous CAT scan also revealed calcium in her coronary arteries. She was seen by Tereso Newcomer in June of 2013 for atypical chest pain and above CT and nuclear scan ordered. Since that time, she continues to have dyspnea on exertion and is on 3 L of oxygen. No orthopnea, PND, chest pain or syncope. Occasional mild pedal edema.  Current Outpatient Prescriptions  Medication Sig Dispense Refill  . albuterol (PROVENTIL HFA) 108 (90 BASE) MCG/ACT inhaler Inhale 2 puffs into the lungs every 4 (four) hours as needed for wheezing or shortness of breath.  1 Inhaler  5  . albuterol (PROVENTIL) (2.5 MG/3ML) 0.083% nebulizer solution Take 2.5 mg by nebulization every 4 (four) hours as needed.       [name] aspirin 81 MG tablet Take 81 mg by mouth daily.        . budesonide-formoterol (SYMBICORT) 160-4.5 MCG/ACT inhaler Inhale 2 puffs into the lungs 2 (two) times daily.  1 Inhaler  11  . butalbital-acetaminophen-caffeine (FIORICET WITH CODEINE) 50-325-40-30 MG per capsule Take 1 capsule by mouth every 6 (six) hours as needed.        . Calcium Carbonate (CALCIUM 500 PO) Take 1 capsule by mouth 2 (two) times daily.        . cetirizine (ZYRTEC) 10 MG tablet Take 1 tablet (10 mg total) by mouth daily.  30 tablet  11  . Cholecalciferol (VITAMIN D) 1000 UNITS capsule Take 1,000 Units by mouth daily.        . fluticasone  (FLONASE) 50 MCG/ACT nasal spray Place 1 spray into the nose daily.      [name] ibuprofen (ADVIL,MOTRIN) 800 MG tablet Take 800 mg by mouth 2 (two) times daily as needed. For knee pain      . levothyroxine (SYNTHROID, LEVOTHROID) 125 MCG tablet Take 125 mcg by mouth daily.        [name] LORazepam (ATIVAN) 1 MG tablet Take 1 tablet (1 mg total) by mouth 2 (two) times daily.  15 tablet  0  . magnesium oxide (MAG-OX) 400 MG tablet Take 400 mg by mouth daily.        [name] olmesartan-hydrochlorothiazide (BENICAR HCT) 40-12.5 MG per tablet Take 1 tablet by mouth daily.  30 tablet  12  . omeprazole (PRILOSEC) 20 MG capsule Take 1 capsule (20 mg total) by mouth daily.  30 capsule  5  . pravastatin (PRAVACHOL) 40 MG tablet TAKE ONE TABLET BY MOUTH DAILY AT BEDTIME.  30 tablet  12  . tiotropium (SPIRIVA) 18 MCG inhalation capsule Place 1 capsule (18 mcg total) into inhaler and inhale daily.  30 capsule  11  . traZODone (DESYREL) 50 MG tablet Take 50 mg by mouth at bedtime.       [name] DISCONTD: fluticasone (FLONASE) 50 MCG/ACT nasal spray Place 2 sprays into the nose daily.  16 g  5  Past Medical History  Diagnosis Date  . Supraventricular tachycardia   . MVP (mitral valve prolapse)   . Hypothyroid   . Hypertension   . Aortic aneurysm   . Pulmonary nodule   . Varicose vein   . Arthritis   . Depression   . COPD (chronic obstructive pulmonary disease)   . CAD (coronary artery disease)     Past Surgical History  Procedure Date  . Appendectomy 1956  . Total abdominal hysterectomy 1975  . Tubal ligation 1972  . Breast surgery     benign left breast cyst removed    History   Social History  . Marital Status: Married    Spouse Name: N/A    Number of Children: N/A  . Years of Education: N/A   Occupational History  . LPN    Social History Main Topics  . Smoking status: Former Smoker -- 1.0 packs/day for 50 years    Types: Cigarettes    Quit date: 07/05/1999  . Smokeless tobacco: Never Used  .  Alcohol Use: Not on file  . Drug Use: Not on file  . Sexually Active: Not on file   Other Topics Concern  . Not on file   Social History Narrative   Pt is married and lives with her husband. She works as an Public house manager.  She has not had any known TB exposures.  She is a former smoker.  Quit in 2001.  She has approx 50 pack year total history.  Does not use alcohol     ROS: pain from fibromyalgia but no fevers or chills, productive cough, hemoptysis, dysphasia, odynophagia, melena, hematochezia, dysuria, hematuria, rash, seizure activity, orthopnea, PND, pedal edema, claudication. Remaining systems are negative.  Physical Exam: Well-developed well-nourished in no acute distress.  Skin is warm and dry.  HEENT is normal.  Neck is supple.  Chest with diminished breath sounds throughout and mild expiratory wheeze. Cardiovascular exam is regular rate and rhythm.  Abdominal exam nontender or distended. No masses palpated. Extremities show trace edema. neuro grossly intact

## 2012-02-02 NOTE — Assessment & Plan Note (Signed)
No recent episodes

## 2012-02-13 DIAGNOSIS — E782 Mixed hyperlipidemia: Secondary | ICD-10-CM | POA: Diagnosis not present

## 2012-02-13 DIAGNOSIS — F329 Major depressive disorder, single episode, unspecified: Secondary | ICD-10-CM | POA: Diagnosis not present

## 2012-02-13 DIAGNOSIS — E559 Vitamin D deficiency, unspecified: Secondary | ICD-10-CM | POA: Diagnosis not present

## 2012-02-13 DIAGNOSIS — R7309 Other abnormal glucose: Secondary | ICD-10-CM | POA: Diagnosis not present

## 2012-02-13 DIAGNOSIS — J449 Chronic obstructive pulmonary disease, unspecified: Secondary | ICD-10-CM | POA: Diagnosis not present

## 2012-02-13 DIAGNOSIS — Z79899 Other long term (current) drug therapy: Secondary | ICD-10-CM | POA: Diagnosis not present

## 2012-02-13 DIAGNOSIS — E038 Other specified hypothyroidism: Secondary | ICD-10-CM | POA: Diagnosis not present

## 2012-02-13 DIAGNOSIS — I10 Essential (primary) hypertension: Secondary | ICD-10-CM | POA: Diagnosis not present

## 2012-03-02 DIAGNOSIS — R6889 Other general symptoms and signs: Secondary | ICD-10-CM | POA: Diagnosis not present

## 2012-03-09 DIAGNOSIS — J441 Chronic obstructive pulmonary disease with (acute) exacerbation: Secondary | ICD-10-CM | POA: Diagnosis not present

## 2012-03-14 DIAGNOSIS — R6889 Other general symptoms and signs: Secondary | ICD-10-CM | POA: Diagnosis not present

## 2012-03-14 DIAGNOSIS — J449 Chronic obstructive pulmonary disease, unspecified: Secondary | ICD-10-CM | POA: Diagnosis not present

## 2012-03-14 DIAGNOSIS — I10 Essential (primary) hypertension: Secondary | ICD-10-CM | POA: Diagnosis not present

## 2012-03-14 DIAGNOSIS — E7800 Pure hypercholesterolemia, unspecified: Secondary | ICD-10-CM | POA: Diagnosis not present

## 2012-03-14 DIAGNOSIS — R4182 Altered mental status, unspecified: Secondary | ICD-10-CM | POA: Diagnosis not present

## 2012-03-14 DIAGNOSIS — R110 Nausea: Secondary | ICD-10-CM | POA: Diagnosis not present

## 2012-03-16 ENCOUNTER — Other Ambulatory Visit: Payer: Self-pay | Admitting: *Deleted

## 2012-03-16 MED ORDER — FLUTICASONE PROPIONATE 50 MCG/ACT NA SUSP: 1.0000 | Freq: Every day | NASAL | Status: DC

## 2012-03-19 DIAGNOSIS — E860 Dehydration: Secondary | ICD-10-CM | POA: Diagnosis not present

## 2012-03-19 DIAGNOSIS — R233 Spontaneous ecchymoses: Secondary | ICD-10-CM | POA: Diagnosis not present

## 2012-03-19 DIAGNOSIS — R7989 Other specified abnormal findings of blood chemistry: Secondary | ICD-10-CM | POA: Diagnosis not present

## 2012-04-30 DIAGNOSIS — J9801 Acute bronchospasm: Secondary | ICD-10-CM | POA: Diagnosis not present

## 2012-04-30 DIAGNOSIS — J209 Acute bronchitis, unspecified: Secondary | ICD-10-CM | POA: Diagnosis not present

## 2012-05-15 DIAGNOSIS — I10 Essential (primary) hypertension: Secondary | ICD-10-CM | POA: Diagnosis not present

## 2012-05-15 DIAGNOSIS — E782 Mixed hyperlipidemia: Secondary | ICD-10-CM | POA: Diagnosis not present

## 2012-05-15 DIAGNOSIS — E785 Hyperlipidemia, unspecified: Secondary | ICD-10-CM | POA: Diagnosis not present

## 2012-05-15 DIAGNOSIS — Z23 Encounter for immunization: Secondary | ICD-10-CM | POA: Diagnosis not present

## 2012-05-15 DIAGNOSIS — F329 Major depressive disorder, single episode, unspecified: Secondary | ICD-10-CM | POA: Diagnosis not present

## 2012-05-15 DIAGNOSIS — E038 Other specified hypothyroidism: Secondary | ICD-10-CM | POA: Diagnosis not present

## 2012-05-15 DIAGNOSIS — Z79899 Other long term (current) drug therapy: Secondary | ICD-10-CM | POA: Diagnosis not present

## 2012-05-15 DIAGNOSIS — J449 Chronic obstructive pulmonary disease, unspecified: Secondary | ICD-10-CM | POA: Diagnosis not present

## 2012-05-15 DIAGNOSIS — E039 Hypothyroidism, unspecified: Secondary | ICD-10-CM | POA: Diagnosis not present

## 2012-05-16 ENCOUNTER — Other Ambulatory Visit: Payer: Self-pay | Admitting: Cardiothoracic Surgery

## 2012-05-16 DIAGNOSIS — I712 Thoracic aortic aneurysm, without rupture: Secondary | ICD-10-CM

## 2012-05-22 ENCOUNTER — Other Ambulatory Visit: Payer: Self-pay | Admitting: Cardiothoracic Surgery

## 2012-05-22 DIAGNOSIS — I712 Thoracic aortic aneurysm, without rupture: Secondary | ICD-10-CM

## 2012-06-01 ENCOUNTER — Other Ambulatory Visit: Payer: Self-pay | Admitting: *Deleted

## 2012-06-01 MED ORDER — OLMESARTAN MEDOXOMIL-HCTZ 40-12.5 MG PO TABS: 1.0000 | ORAL_TABLET | Freq: Every day | ORAL | Status: DC

## 2012-06-04 ENCOUNTER — Ambulatory Visit (INDEPENDENT_AMBULATORY_CARE_PROVIDER_SITE_OTHER): Payer: Medicare Other | Admitting: Emergency Medicine

## 2012-06-04 ENCOUNTER — Encounter: Payer: Self-pay | Admitting: Emergency Medicine

## 2012-06-04 VITALS — BP 102/56 | HR 93 | Temp 97.8°F | Ht 64.0 in | Wt 160.8 lb

## 2012-06-04 DIAGNOSIS — J449 Chronic obstructive pulmonary disease, unspecified: Secondary | ICD-10-CM

## 2012-06-04 NOTE — Assessment & Plan Note (Signed)
With frequent exacerbations - 2 since our last visit.  - encouraged her to call me with any breathing changes - will consider daliresp next visit or if another AE - same inhaled regimen. - rov 2

## 2012-06-04 NOTE — Progress Notes (Signed)
Morgan Hays is a 71 year old woman who follows up today for her COPD, dyspnea, and for a history of a RLL pulmonary nodule. 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 No sgy has been planned due to her significant lung disease. Also with hx depression.   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 Could not tolerate Daliresp. She has been rx with pred + abx x 1 since last visit by PCP. Currently at baseline, O2 is set on 3L/min. She does have some wheezing. Taking loratadine and fluticasone every day.   ROV 07/26/11 -- Severe COPD on O2, chronic cough being rx for allergies and GERD. She is here for regular f/u. She mentions today that she is having some cyanosis, coolness and numbness in her feet for the last 5 -6 months. No w/u done yet, but she is going to mention to her PCP. Breathing has been stable, no flares since last visit. Taking Spiriva + Symbicort + SABA; uses albuterol at least once a day.   ROV 10/31/11 -- Severe COPD on O2, chronic cough being rx for allergies and GERD. Having more nasal sx, gtt, wheezing.  She has not had flare since last time.  Interested in Research scientist (medical).   ROV 02/01/12 -- Severe COPD on O2, chronic cough being rx for allergies and GERD. Since last visit she has undergone Myoview for atypical CP, reassuring study. 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 CT chest > no evidence PE.  Breathing has been stable, still limiting her exertion. Rarely uses SABA.  Spiriva + Symbicort, O2 on 3L/min. She believes that stopping cymbalta has helped her. Some occasional cough.   ROV 06/05/12 -- Severe COPD on O2, chronic cough being rx for allergies and GERD.  CAT score today 23. Spiriva + Symbicort, O2 on 3L/min.  Reports today that her energy is very low, not sure that it is worsening of breathing. She is using albuterol about every day. She had the flu shot. She had prednisone/abx in September. Went to ED in august  after a fall.   PE:  Filed Vitals:   06/04/12 1032  BP: 102/56  Pulse: 93  Temp: 97.8 F (36.6 C)   Gen: Pleasant, obese, in no distress,  Obese female.   ENT: No lesions,  mouth clear,  oropharynx clear, no postnasal drip  Neck: No JVD, no TMG, no carotid bruits  Lungs: Distant  BS w/ no wheezing, very distant  Cardiovascular: RRR, heart sounds normal, no murmur or gallops, trace edema  Musculoskeletal: No deformities, no cyanosis or clubbing  Neuro: alert, non focal  Skin: Warm, no lesions or rashes   12/19/11 Myoview:  Overall Impression: Normal stress nuclear study with a small, mild, fixed apical defect consistent with thinning; no ischemia.  LV Ejection Fraction: 79%. LV Wall Motion: NL LV Function; NL Wall Motion   C O P D With frequent exacerbations - 2 since our last visit.  - encouraged her to call me with any breathing changes - will consider daliresp next visit or if another AE - same inhaled regimen. - rov 2

## 2012-06-04 NOTE — Patient Instructions (Addendum)
Please continue your current medications as you are taking them  Follow with Dr Jyl Chico in 3 months or sooner if you have any problems.  

## 2012-07-02 ENCOUNTER — Other Ambulatory Visit: Payer: Self-pay | Admitting: Cardiothoracic Surgery

## 2012-07-02 ENCOUNTER — Other Ambulatory Visit: Payer: Self-pay | Admitting: Cardiology

## 2012-07-02 ENCOUNTER — Other Ambulatory Visit: Payer: Self-pay | Admitting: *Deleted

## 2012-07-02 DIAGNOSIS — I712 Thoracic aortic aneurysm, without rupture: Secondary | ICD-10-CM | POA: Diagnosis not present

## 2012-07-02 LAB — CREATININE, SERUM: Creat: 0.65 mg/dL (ref 0.50–1.10)

## 2012-07-02 LAB — BUN: BUN: 9 mg/dL (ref 6–23)

## 2012-07-02 MED ORDER — PRAVASTATIN SODIUM 40 MG PO TABS: 40.0000 mg | ORAL_TABLET | Freq: Every day | ORAL | Status: DC

## 2012-07-05 ENCOUNTER — Encounter: Payer: Self-pay | Admitting: Cardiothoracic Surgery

## 2012-07-05 ENCOUNTER — Ambulatory Visit
Admission: RE | Admit: 2012-07-05 | Discharge: 2012-07-05 | Disposition: A | Discharge status: Home / Self Care | Payer: Medicare Other | Source: Ambulatory Visit | Attending: Cardiothoracic Surgery | Admitting: Cardiothoracic Surgery

## 2012-07-05 ENCOUNTER — Ambulatory Visit (INDEPENDENT_AMBULATORY_CARE_PROVIDER_SITE_OTHER): Payer: Medicare Other | Admitting: Cardiothoracic Surgery

## 2012-07-05 VITALS — BP 140/73 | HR 78 | Resp 20 | Ht 64.0 in | Wt 177.0 lb

## 2012-07-05 DIAGNOSIS — I77810 Thoracic aortic ectasia: Secondary | ICD-10-CM

## 2012-07-05 DIAGNOSIS — I712 Thoracic aortic aneurysm, without rupture: Secondary | ICD-10-CM

## 2012-07-05 MED ORDER — IOHEXOL 350 MG/ML SOLN
100.0000 mL | Freq: Once | INTRAVENOUS | Status: AC | PRN
  Administered 2012-07-05: 100 mL via INTRAVENOUS

## 2012-07-05 NOTE — Patient Instructions (Addendum)
Ct of chest  And aorta not changed much Follow up scan in one year

## 2012-07-05 NOTE — Progress Notes (Signed)
Patient ID: Morgan Hays, female   DOB: Dec 11, 1940, 72 y.o.   MRN: [id number]                                     [address]            [place] [address]          [phone]         Morgan Hays [facility] Medical Record #[id number] Date of Birth: 01-09-1941  Referring: Morgan Pebbles, PA Primary Care: Morgan Hays, [place] Pulmonology: Dr. Delton Coombes Cardiology: Dr. Jens Som  Chief Complaint:    Chief Complaint  Patient presents with  . Follow-up    1 year f/u with Chest CTA for surveillance of ascending aortic aneurysm    History of Present Illness:     Patient returns with followup CTs of the chest to evaluate the size of her dilated ascending and descending aorta. She is followed both by pulmonology and cardiology. The patient remains very limited on home oxygen, as she describes is unable" to do little more than liver thumbs and watch television".  Current Activity/ Functional Status: Patient is very limited in her functional ability notes that she becomes short of breath walking less than 100 feet. His generalized weakness. Notes it should become short of breath just sweeping the floor in her house and is not engaged in much physical activity primarily secondary to shortness of breath.   Past Medical History  Diagnosis Date  . Supraventricular tachycardia   . MVP (mitral valve prolapse)   . Hypothyroid   . Hypertension   . Aortic aneurysm   . Pulmonary nodule   . Varicose vein   . Arthritis   . Depression   . COPD (chronic obstructive pulmonary disease)   . CAD (coronary artery disease)     Past Surgical History  Procedure Date  . Appendectomy 1956  . Total abdominal hysterectomy 1975  . Tubal ligation 1972  . Breast surgery     benign left breast cyst removed    History  Smoking status  . Former Smoker -- 1.0 packs/day for 50 years  . Types: Cigarettes  . Quit date: 07/05/1999  Smokeless tobacco  . Never Used   History  Alcohol Use: Not on  file    History   Social History  . Marital Status: Married    Spouse Name: N/A    Number of Children: N/A  . Years of Education: N/A   Occupational History  . LPN    Social History Main Topics  . Smoking status: Former Smoker -- 1.0 packs/day for 50 years    Types: Cigarettes    Quit date: 07/05/1999  . Smokeless tobacco: Not on file  . Alcohol Use: Not on file  . Drug Use: Not on file  . Sexually Active: Not on file    Social History Narrative   Pt is married and lives with her husband. She works as an Public house manager.  She has not had any known TB exposures.  She is a former smoker.  Quit in 2001.  She has approx 50 pack year total history.  Does not use alcohol     Allergies  Allergen Reactions  . Cymbalta (Duloxetine Hcl)     Intolerance   . Daliresp (Roflumilast) Diarrhea  . Doxycycline Nausea And Vomiting  . Lisinopril   . Lopressor (Metoprolol Tartrate)  Intolerance     Current Outpatient Prescriptions  Medication Sig Dispense Refill  . albuterol (PROVENTIL HFA) 108 (90 BASE) MCG/ACT inhaler Inhale 2 puffs into the lungs every 6 (six) hours as needed.        [name] albuterol (PROVENTIL) (2.5 MG/3ML) 0.083% nebulizer solution Take 2.5 mg by nebulization 2 (two) times daily.        [name] aspirin 81 MG tablet Take 81 mg by mouth daily.        . budesonide-formoterol (SYMBICORT) 160-4.5 MCG/ACT inhaler Inhale 2 puffs into the lungs 2 (two) times daily.        . butalbital-acetaminophen-caffeine (FIORICET WITH CODEINE) 50-325-40-30 MG per capsule Take 1 capsule by mouth every 6 (six) hours as needed.        . Calcium Carbonate (CALCIUM 500 PO) Take 1 capsule by mouth 2 (two) times daily.        . Cholecalciferol (VITAMIN D) 1000 UNITS capsule Take 1,000 Units by mouth daily.        . fluticasone (FLONASE) 50 MCG/ACT nasal spray Place 2 sprays into the nose daily.  16 g  5  . levothyroxine (SYNTHROID, LEVOTHROID) 125 MCG tablet Take 125 mcg by mouth daily.        [name] loratadine  (CLARITIN) 10 MG tablet Take 10 mg by mouth daily.        [name] LORazepam (ATIVAN) 1 MG tablet Take 1 tablet (1 mg total) by mouth 2 (two) times daily.  15 tablet  0  . magnesium oxide (MAG-OX) 400 MG tablet Take 400 mg by mouth daily.        [name] olmesartan-hydrochlorothiazide (BENICAR HCT) 40-12.5 MG per tablet Take 1 tablet by mouth daily.  30 tablet  12  . omeprazole (PRILOSEC) 20 MG capsule Take 1 capsule (20 mg total) by mouth daily.  30 capsule  5  . ondansetron (ZOFRAN) 4 MG tablet Take 1 tablet (4 mg total) by mouth every 8 (eight) hours as needed for nausea.  20 tablet  0  . pravastatin (PRAVACHOL) 40 MG tablet Take 40 mg by mouth daily.        . sertraline (ZOLOFT) 50 MG tablet Take 100 mg by mouth 2 (two) times daily.       [name] tiotropium (SPIRIVA) 18 MCG inhalation capsule Place 18 mcg into inhaler and inhale daily.        . traZODone (DESYREL) 50 MG tablet Take 50 mg by mouth at bedtime.             Family History  Problem Relation Age of Onset  . Heart disease Father   . Heart disease Son   . Heart disease Mother   . Heart disease Other     grandfather      Review of Systems:     Cardiac Review of Systems: Y or N  Chest Pain [  n  ]  Resting SOB [ y  ] Exertional SOB  Morgan Hays  ]  Orthopnea Morgan Hays  ]   Pedal Edema Morgan Hays   ]    Palpitations Morgan Hays  ] Syncope  Morgan Hays  ]   Presyncope [ n  ]  General Review of Systems: [Y] = yes [  ]=no Constitional: recent weight change [ n ]; anorexia [  ]; fatigue [ y ]; nausea [  ]; night sweats [  ]; fever [ n ]; or chills [ n ];  Dental: poor dentition[  ];  Eye : blurred vision [  ]; diplopia [   ]; vision changes [  ];  Amaurosis fugax[  ]; Resp: cough [ y];  wheezing[ y ];  hemoptysis[n  ]; shortness of breath[y  ]; paroxysmal nocturnal dyspnea[  ]; dyspnea on exertion[ y ]; or orthopnea[  y];  GI:  gallstones[  ],  vomiting[  ];  dysphagia[  ]; melena[  ];  hematochezia [  ]; heartburn[  ];   Hx of  Colonoscopy[  ]; GU: kidney stones [  ]; hematuria[  ];   dysuria [  ];  nocturia[  ];  history of     obstruction [  ];             Skin: rash, swelling[  ];, hair loss[  ];  peripheral edema[  ];  or itching[  ]; Musculosketetal: myalgias[  ];  joint swelling[  ];  joint erythema[  ];  joint pain[  ];  back pain[  ];  Heme/Lymph: bruising[  ];  bleeding[  ];  anemia[  ];  Neuro: TIA[  ];  headaches[  ];  stroke[  ];  vertigo[  ];  seizures[  ];   paresthesias[  ];  difficulty walking[yes complains of fibromyalgia   ];  Psych:depression[  ]; anxiety[  ];  Endocrine: diabetes[  ];  thyroid dysfunction[  ];  Immunizations: Flu [ y ]; Pneumococcal[y  ];  Other:  Physical Exam: BP 140/73  Pulse 78  Resp 20  Ht 5\' 4"  (1.626 m)  Wt 177 lb (80.287 kg)  BMI 30.38 kg/m2  SpO2 98%  General appearance: alert, appears older than stated age, no distress, moderately obese and Short of breath at rest on oxygen in the office. She gets short of breath walking from the lobby to the exam room Neurologic: intact Heart: regular rate and rhythm, S1, S2 normal, no murmur, click, rub or gallop do not hear murmur of AS Lungs: diminished breath sounds bilaterally Abdomen: soft, non-tender; bowel sounds normal; no masses,  no organomegaly Extremities: extremities normal, atraumatic, no cyanosis or edema and Homans sign is negative, no sign of DVT   Diagnostic Studies & Laboratory data:     Recent Radiology Findings:   Ct Angio Chest Aorta W/cm &/or Wo/cm  07/05/2012  *RADIOLOGY REPORT*  Clinical Data: Thoracic aortic aneurysm  CT ANGIOGRAPHY CHEST  Technique:  Multidetector CT imaging of the chest using the standard protocol during bolus administration of intravenous contrast. Multiplanar reconstructed images including MIPs were obtained and reviewed to evaluate the vascular anatomy.  Contrast: OMNIPAQUE IOHEXOL 350  MG/ML SOLN  Comparison: Prior CTA chest 06/09/2011  Findings:  Mediastinum: No suspicious mediastinal or hilar adenopathy. Minimally patulous thoracic esophagus.  Heart/Vascular:  There is stable aneurysmal dilatation of the ascending thoracic aorta which measures up to 5.1 cm in greatest transverse dimension.  There is thickening and calcification of the aortic valve leaflets suggesting underlying aortic stenosis.  There has been progression of fusiform aneurysmal dilatation of the descending thoracic aorta.  The maximal transverse dimension measures 4.3 cm today compared to4.0 cm previously.  Scattered atherosclerotic and non out of sclerotic vascular plaque without significant stenosis.  The origins of the great vessels are patent. Type 3 aortic arch.  Heart size remains within normal limits.  No pericardial effusion.  Calcifications noted in the left anterior descending, circumflex and right coronary arteries.  Unremarkable pulmonary arteries.  Lungs/Pleura: Background of moderately severe  combined centrilobular and paraseptal emphysema without significant interval progression.  The lungs are otherwise clear save for some chronic atelectasis versus scarring in the inferior lingula.  No suspicious pulmonary nodules.  No pleural effusion.  Upper Abdomen: Atherosclerotic plaque in the splenic vessels with likely mild narrowing of the origin the right renal artery. Otherwise, the visualized upper abdomen is unremarkable.  Bones: No acute fracture or aggressive appearing lytic or blastic osseous lesion.  Mild multilevel degenerative disc disease.  No evident.  IMPRESSION:  1.  Stable fusiform aneurysmal dilatation of the ascending aorta to a maximal diameter of 5.1 cm.  2.  Thickening and calcification of the aortic valve cusps suggestive of underlying aortic stenosis.  If clinically warranted, echocardiogram could further evaluate.  3.  Slight interval enlargement of the fusiform dilatation of the proximal  descending thoracic aorta measuring up to 4.3 cm in diameter today compared to 4.0 cm on 06/09/2011.  4.  Atherosclerosis including coronary artery disease.  5.  Moderate centrilobular and paraseptal emphysema.  Signed,  Sterling Big, MD Vascular & Interventional Radiologist [facility] Radiology   Original Report Authenticated By: Malachy Moan, M.D.       Recent Lab Findings: Lab Results  Component Value Date   WBC 7.8 11/29/2006   HGB 12.3 11/29/2006   HCT 36.4 11/29/2006   PLT 313 11/29/2006   GLUCOSE 88 12/12/2011   CHOL 145 05/31/2010   TRIG 119.0 05/31/2010   HDL 46.60 05/31/2010   LDLCALC 75 05/31/2010   ALT 19 05/31/2010   AST 22 05/31/2010   NA 137 12/12/2011   K 4.2 12/12/2011   CL 95* 12/12/2011   CREATININE 0.65 07/02/2012   BUN 9 07/02/2012   CO2 35* 12/12/2011   TSH 1.71 09/17/2007   INR 1.1 RATIO 11/29/2006      Assessment / Plan:     Patient is a 73 year old female with severe underlying pulmonary disease who returns for followup CT of the chest. She has a known ascending aortic aneurysm at approximately 5 cm. Reviewing the scans from 2008 there's been just very slight change. With her significant underlying pulmonary disease and her desire not to have any major invasive procedures done I recommended that we continue with observation and blood pressure control. She is aware of the risk of sudden dissection and/or rupture. I'll plan to see her back in one year with a followup CT scan. The patient has again confirmed this year she does not wish to have operative intervention.      Delight Ovens MD 07/05/2012 10:29 AM

## 2012-07-30 DIAGNOSIS — J069 Acute upper respiratory infection, unspecified: Secondary | ICD-10-CM | POA: Diagnosis not present

## 2012-07-30 DIAGNOSIS — R5383 Other fatigue: Secondary | ICD-10-CM | POA: Diagnosis not present

## 2012-07-30 DIAGNOSIS — N3000 Acute cystitis without hematuria: Secondary | ICD-10-CM | POA: Diagnosis not present

## 2012-07-31 ENCOUNTER — Other Ambulatory Visit: Payer: Self-pay | Admitting: Emergency Medicine

## 2012-07-31 DIAGNOSIS — R05 Cough: Secondary | ICD-10-CM

## 2012-07-31 MED ORDER — OMEPRAZOLE 20 MG PO CPDR: 20.0000 mg | DELAYED_RELEASE_CAPSULE | Freq: Every day | ORAL | Status: DC

## 2012-08-20 DIAGNOSIS — E038 Other specified hypothyroidism: Secondary | ICD-10-CM | POA: Diagnosis not present

## 2012-08-20 DIAGNOSIS — E782 Mixed hyperlipidemia: Secondary | ICD-10-CM | POA: Diagnosis not present

## 2012-08-20 DIAGNOSIS — F411 Generalized anxiety disorder: Secondary | ICD-10-CM | POA: Diagnosis not present

## 2012-08-20 DIAGNOSIS — I10 Essential (primary) hypertension: Secondary | ICD-10-CM | POA: Diagnosis not present

## 2012-08-20 DIAGNOSIS — R7301 Impaired fasting glucose: Secondary | ICD-10-CM | POA: Diagnosis not present

## 2012-08-20 DIAGNOSIS — J449 Chronic obstructive pulmonary disease, unspecified: Secondary | ICD-10-CM | POA: Diagnosis not present

## 2012-08-20 DIAGNOSIS — D649 Anemia, unspecified: Secondary | ICD-10-CM | POA: Diagnosis not present

## 2012-08-20 DIAGNOSIS — R5381 Other malaise: Secondary | ICD-10-CM | POA: Diagnosis not present

## 2012-08-23 DIAGNOSIS — E782 Mixed hyperlipidemia: Secondary | ICD-10-CM | POA: Diagnosis not present

## 2012-08-23 DIAGNOSIS — I10 Essential (primary) hypertension: Secondary | ICD-10-CM | POA: Diagnosis not present

## 2012-08-23 DIAGNOSIS — E038 Other specified hypothyroidism: Secondary | ICD-10-CM | POA: Diagnosis not present

## 2012-08-23 DIAGNOSIS — F329 Major depressive disorder, single episode, unspecified: Secondary | ICD-10-CM | POA: Diagnosis not present

## 2012-08-23 DIAGNOSIS — J449 Chronic obstructive pulmonary disease, unspecified: Secondary | ICD-10-CM | POA: Diagnosis not present

## 2012-08-27 ENCOUNTER — Other Ambulatory Visit: Payer: Self-pay | Admitting: Cardiology

## 2012-08-29 ENCOUNTER — Other Ambulatory Visit: Payer: Self-pay | Admitting: *Deleted

## 2012-08-29 MED ORDER — LOSARTAN POTASSIUM-HCTZ 100-12.5 MG PO TABS: 1.0000 | ORAL_TABLET | Freq: Every day | ORAL | Status: DC

## 2012-08-29 NOTE — Telephone Encounter (Signed)
Dc benicar hct and begin hyzaar 100/12.5 daily Morgan Hays

## 2012-08-29 NOTE — Telephone Encounter (Signed)
Pharmacy is requesting patient be switched to Hyzaar instead of Benicar HCT will route this to Dr. Waunita Schooner nurse.Foye Spurling

## 2012-09-20 DIAGNOSIS — I10 Essential (primary) hypertension: Secondary | ICD-10-CM | POA: Diagnosis not present

## 2012-09-21 ENCOUNTER — Ambulatory Visit (INDEPENDENT_AMBULATORY_CARE_PROVIDER_SITE_OTHER): Payer: Medicare Other | Admitting: Emergency Medicine

## 2012-09-21 ENCOUNTER — Encounter: Payer: Self-pay | Admitting: Emergency Medicine

## 2012-09-21 VITALS — BP 120/62 | HR 99 | Temp 98.8°F | Ht 64.0 in | Wt 183.0 lb

## 2012-09-21 DIAGNOSIS — J449 Chronic obstructive pulmonary disease, unspecified: Secondary | ICD-10-CM | POA: Diagnosis not present

## 2012-09-21 MED ORDER — PREDNISONE 10 MG PO TABS: 20.0000 mg | ORAL_TABLET | Freq: Every day | ORAL | Status: DC

## 2012-09-21 NOTE — Assessment & Plan Note (Signed)
-   continue your symbicort + spiriva - continue o2 - trial daily pred 20mg , then taper to tolerable dose - rov 1 month

## 2012-09-21 NOTE — Patient Instructions (Addendum)
Please continue your oxygen and inhaled medications as you are taking them Start prednisone 20mg  daily until next visit.  Follow with Dr Delton Coombes in 1 month

## 2012-09-21 NOTE — Progress Notes (Signed)
Morgan Hays is a 72 year old woman who follows up today for her COPD, dyspnea, and for a history of a RLL pulmonary nodule. 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 No sgy has been planned due to her significant lung disease. Also with hx depression.   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 Could not tolerate Daliresp. She has been rx with pred + abx x 1 since last visit by PCP. Currently at baseline, O2 is set on 3L/min. She does have some wheezing. Taking loratadine and fluticasone every day.   ROV 07/26/11 -- Severe COPD on O2, chronic cough being rx for allergies and GERD. She is here for regular f/u. She mentions today that she is having some cyanosis, coolness and numbness in her feet for the last 5 -6 months. No w/u done yet, but she is going to mention to her PCP. Breathing has been stable, no flares since last visit. Taking Spiriva + Symbicort + SABA; uses albuterol at least once a day.   ROV 10/31/11 -- Severe COPD on O2, chronic cough being rx for allergies and GERD. Having more nasal sx, gtt, wheezing.  She has not had flare since last time.  Interested in Research scientist (medical).   ROV 02/01/12 -- Severe COPD on O2, chronic cough being rx for allergies and GERD. Since last visit she has undergone Myoview for atypical CP, reassuring study. 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 CT chest > no evidence PE.  Breathing has been stable, still limiting her exertion. Rarely uses SABA.  Spiriva + Symbicort, O2 on 3L/min. She believes that stopping cymbalta has helped her. Some occasional cough.   ROV 06/05/12 -- Severe COPD on O2, chronic cough being rx for allergies and GERD.  CAT score today 23. Spiriva + Symbicort, O2 on 3L/min.  Reports today that her energy is very low, not sure that it is worsening of breathing. She is using albuterol about every day. She had the flu shot. She had prednisone/abx in September. Went to ED in august  after a fall.   ROV 09/21/12 -- Severe COPD on O2, chronic cough being rx for allergies and GERD.  Her breathing is very limiting. She has been rx with pred x 2 since last time.    CAT Score 09/21/2012 06/04/2012  Total CAT Score 23 23     PE:  Filed Vitals:   09/21/12 1506  BP: 120/62  Pulse: 99  Temp: 98.8 F (37.1 C)   Gen: Pleasant, obese, in no distress,  Obese female.   ENT: No lesions,  mouth clear,  oropharynx clear, no postnasal drip  Neck: No JVD, no TMG, no carotid bruits  Lungs: Distant  BS w/ no wheezing, very distant  Cardiovascular: RRR, heart sounds normal, no murmur or gallops, trace edema  Musculoskeletal: No deformities, no cyanosis or clubbing  Neuro: alert, non focal  Skin: Warm, no lesions or rashes   12/19/11 Myoview:  Overall Impression: Normal stress nuclear study with a small, mild, fixed apical defect consistent with thinning; no ischemia.  LV Ejection Fraction: 79%. LV Wall Motion: NL LV Function; NL Wall Motion   07/05/12 --  Comparison: Prior CTA chest 06/09/2011  Findings:  Mediastinum: No suspicious mediastinal or hilar adenopathy.  Minimally patulous thoracic esophagus.  Heart/Vascular: There is stable aneurysmal dilatation of the  ascending thoracic aorta which measures up to 5.1 cm in greatest  transverse dimension. There is thickening and  calcification of the  aortic valve leaflets suggesting underlying aortic stenosis. There  has been progression of fusiform aneurysmal dilatation of the  descending thoracic aorta. The maximal transverse dimension  measures 4.3 cm today compared to4.0 cm previously. Scattered  atherosclerotic and non out of sclerotic vascular plaque without  significant stenosis. The origins of the great vessels are patent.  Type 3 aortic arch. Heart size remains within normal limits. No  pericardial effusion. Calcifications noted in the left anterior  descending, circumflex and right coronary arteries.  Unremarkable  pulmonary arteries.  Lungs/Pleura: Background of moderately severe combined  centrilobular and paraseptal emphysema without significant interval  progression. The lungs are otherwise clear save for some chronic  atelectasis versus scarring in the inferior lingula. No suspicious  pulmonary nodules. No pleural effusion.  Upper Abdomen: Atherosclerotic plaque in the splenic vessels with  likely mild narrowing of the origin the right renal artery.  Otherwise, the visualized upper abdomen is unremarkable.  Bones: No acute fracture or aggressive appearing lytic or blastic  osseous lesion. Mild multilevel degenerative disc disease. No  evident.  IMPRESSION:  1. Stable fusiform aneurysmal dilatation of the ascending aorta to  a maximal diameter of 5.1 cm.  2. Thickening and calcification of the aortic valve cusps  suggestive of underlying aortic stenosis. If clinically warranted,  echocardiogram could further evaluate.  3. Slight interval enlargement of the fusiform dilatation of the  proximal descending thoracic aorta measuring up to 4.3 cm in  diameter today compared to 4.0 cm on 06/09/2011.  4. Atherosclerosis including coronary artery disease.  5. Moderate centrilobular and paraseptal emphysema.   C O P D - continue your symbicort + spiriva - continue o2 - trial daily pred 20mg , then taper to tolerable dose - rov 1 month

## 2012-10-22 ENCOUNTER — Encounter: Payer: Self-pay | Admitting: Emergency Medicine

## 2012-10-22 ENCOUNTER — Ambulatory Visit (INDEPENDENT_AMBULATORY_CARE_PROVIDER_SITE_OTHER): Payer: Medicare Other | Admitting: Emergency Medicine

## 2012-10-22 VITALS — BP 110/66 | HR 92 | Temp 97.6°F | Ht 64.0 in | Wt 185.0 lb

## 2012-10-22 DIAGNOSIS — J449 Chronic obstructive pulmonary disease, unspecified: Secondary | ICD-10-CM

## 2012-10-22 MED ORDER — PREDNISONE 5 MG PO TABS: 5.0000 mg | ORAL_TABLET | Freq: Every day | ORAL | Status: DC

## 2012-10-22 NOTE — Assessment & Plan Note (Addendum)
Continue inhaled regimen + O2 Decrease pred to 15mg  and reassess in a month to see if we can wean further.  Wants a portable concentrator 1 month.

## 2012-10-22 NOTE — Progress Notes (Signed)
Morgan Hays is a 72 year old woman who follows up today for her COPD, dyspnea, and for a history of a RLL pulmonary nodule. 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 No sgy has been planned due to her significant lung disease. Also with hx depression.   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 Could not tolerate Daliresp. She has been rx with pred + abx x 1 since last visit by PCP. Currently at baseline, O2 is set on 3L/min. She does have some wheezing. Taking loratadine and fluticasone every day.   ROV 07/26/11 -- Severe COPD on O2, chronic cough being rx for allergies and GERD. She is here for regular f/u. She mentions today that she is having some cyanosis, coolness and numbness in her feet for the last 5 -6 months. No w/u done yet, but she is going to mention to her PCP. Breathing has been stable, no flares since last visit. Taking Spiriva + Symbicort + SABA; uses albuterol at least once a day.   ROV 10/31/11 -- Severe COPD on O2, chronic cough being rx for allergies and GERD. Having more nasal sx, gtt, wheezing.  She has not had flare since last time.  Interested in Research scientist (medical).   ROV 02/01/12 -- Severe COPD on O2, chronic cough being rx for allergies and GERD. Since last visit she has undergone Myoview for atypical CP, reassuring study. 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 CT chest > no evidence PE.  Breathing has been stable, still limiting her exertion. Rarely uses SABA.  Spiriva + Symbicort, O2 on 3L/min. She believes that stopping cymbalta has helped her. Some occasional cough.   ROV 06/05/12 -- Severe COPD on O2, chronic cough being rx for allergies and GERD.  CAT score today 23. Spiriva + Symbicort, O2 on 3L/min.  Reports today that her energy is very low, not sure that it is worsening of breathing. She is using albuterol about every day. She had the flu shot. She had prednisone/abx in September. Went to ED in august  after a fall.   ROV 09/21/12 -- Severe COPD on O2, chronic cough being rx for allergies and GERD.  Her breathing is very limiting. She has been rx with pred x 2 since last time.   ROV 10/22/12 -- Severe COPD on O2, chronic cough being rx for allergies and GERD. Returns today reporting worsening dyspnea. We started daily pred a month ago to see if she would benefit. She can tell a difference at rest and w sleeping. No real difference in exertional SOB   CAT Score 10/22/2012 09/21/2012 06/04/2012  Total CAT Score 26 23 23      PE:  Filed Vitals:   10/22/12 1421  BP: 110/66  Pulse: 92  Temp: 97.6 F (36.4 C)   Gen: Pleasant, obese, in no distress,  Obese female.   ENT: No lesions,  mouth clear,  oropharynx clear, no postnasal drip  Neck: No JVD, no TMG, no carotid bruits  Lungs: Distant  BS w/ no wheezing, very distant  Cardiovascular: RRR, heart sounds normal, no murmur or gallops, trace edema  Musculoskeletal: No deformities, no cyanosis or clubbing  Neuro: alert, non focal  Skin: Warm, no lesions or rashes   12/19/11 Myoview:  Overall Impression: Normal stress nuclear study with a small, mild, fixed apical defect consistent with thinning; no ischemia.  LV Ejection Fraction: 79%. LV Wall Motion: NL LV Function; NL Wall Motion  07/05/12 --  Comparison: Prior CTA chest 06/09/2011  Findings:  Mediastinum: No suspicious mediastinal or hilar adenopathy.  Minimally patulous thoracic esophagus.  Heart/Vascular: There is stable aneurysmal dilatation of the  ascending thoracic aorta which measures up to 5.1 cm in greatest  transverse dimension. There is thickening and calcification of the  aortic valve leaflets suggesting underlying aortic stenosis. There  has been progression of fusiform aneurysmal dilatation of the  descending thoracic aorta. The maximal transverse dimension  measures 4.3 cm today compared to4.0 cm previously. Scattered  atherosclerotic and non out of sclerotic  vascular plaque without  significant stenosis. The origins of the great vessels are patent.  Type 3 aortic arch. Heart size remains within normal limits. No  pericardial effusion. Calcifications noted in the left anterior  descending, circumflex and right coronary arteries. Unremarkable  pulmonary arteries.  Lungs/Pleura: Background of moderately severe combined  centrilobular and paraseptal emphysema without significant interval  progression. The lungs are otherwise clear save for some chronic  atelectasis versus scarring in the inferior lingula. No suspicious  pulmonary nodules. No pleural effusion.  Upper Abdomen: Atherosclerotic plaque in the splenic vessels with  likely mild narrowing of the origin the right renal artery.  Otherwise, the visualized upper abdomen is unremarkable.  Bones: No acute fracture or aggressive appearing lytic or blastic  osseous lesion. Mild multilevel degenerative disc disease. No  evident.  IMPRESSION:  1. Stable fusiform aneurysmal dilatation of the ascending aorta to  a maximal diameter of 5.1 cm.  2. Thickening and calcification of the aortic valve cusps  suggestive of underlying aortic stenosis. If clinically warranted,  echocardiogram could further evaluate.  3. Slight interval enlargement of the fusiform dilatation of the  proximal descending thoracic aorta measuring up to 4.3 cm in  diameter today compared to 4.0 cm on 06/09/2011.  4. Atherosclerosis including coronary artery disease.  5. Moderate centrilobular and paraseptal emphysema.   C O P D Continue inhaled regimen + O2 Decrease pred to 15mg  and reassess in a month to see if we can wean further.  Wants a portable concentrator 1 month.

## 2012-10-22 NOTE — Patient Instructions (Addendum)
Please continue your inhaled medications as you are taking them Decrease prednisone to 15mg  daily until next visit We will work on getting a portable O2 concentrator.  Follow with Dr Delton Coombes in 1 month

## 2012-10-30 ENCOUNTER — Encounter: Payer: Self-pay | Admitting: Cardiology

## 2012-10-30 ENCOUNTER — Ambulatory Visit (INDEPENDENT_AMBULATORY_CARE_PROVIDER_SITE_OTHER): Payer: Medicare Other | Admitting: Cardiology

## 2012-10-30 VITALS — BP 130/69 | HR 104 | Ht 64.0 in | Wt 188.0 lb

## 2012-10-30 DIAGNOSIS — I10 Essential (primary) hypertension: Secondary | ICD-10-CM | POA: Diagnosis not present

## 2012-10-30 DIAGNOSIS — I719 Aortic aneurysm of unspecified site, without rupture: Secondary | ICD-10-CM | POA: Diagnosis not present

## 2012-10-30 DIAGNOSIS — E785 Hyperlipidemia, unspecified: Secondary | ICD-10-CM | POA: Diagnosis not present

## 2012-10-30 DIAGNOSIS — I2510 Atherosclerotic heart disease of native coronary artery without angina pectoris: Secondary | ICD-10-CM

## 2012-10-30 DIAGNOSIS — Z8679 Personal history of other diseases of the circulatory system: Secondary | ICD-10-CM

## 2012-10-30 NOTE — Assessment & Plan Note (Signed)
Blood pressure controlled. Continue present medications. Potassium and renal function monitored by primary care. 

## 2012-10-30 NOTE — Patient Instructions (Addendum)
Your physician wants you to follow-up in: ONE YEAR WITH DR CRENSHAW You will receive a reminder letter in the mail two months in advance. If you don't receive a letter, please call our office to schedule the follow-up appointment.  

## 2012-10-30 NOTE — Assessment & Plan Note (Signed)
Continue aspirin and statin. 

## 2012-10-30 NOTE — Assessment & Plan Note (Signed)
Continue statin. Lipids and liver monitored by primary care. 

## 2012-10-30 NOTE — Progress Notes (Signed)
HPI: Morgan Hays is a pleasant female with a thoracic aneurysm, severe COPD, CAD and paroxysmal atrial tachycardia. She had an echocardiogram in Nov 2010, that showed normal LV function, grade 1 diastolic dysfunction, aortic aneurysm, mild AI and mild to moderate MR. Last Myoview was performed in June of 2013. Her ejection fraction was 79% with apical thinning and no ischemia. Chest CTA in Nov of 2013 showed a thoracic aneurysm of 5.1cm. There was COPD and emphysema; aortic and coronary calcification. She is followed by Dr. Tyrone Sage for her aneurysm. I last saw her in August of 2013. Since then,    Current Outpatient Prescriptions  Medication Sig Dispense Refill  . albuterol (PROVENTIL HFA) 108 (90 BASE) MCG/ACT inhaler Inhale 2 puffs into the lungs every 4 (four) hours as needed for wheezing or shortness of breath.  1 Inhaler  5  . aspirin 81 MG tablet Take 81 mg by mouth daily.        . budesonide-formoterol (SYMBICORT) 160-4.5 MCG/ACT inhaler Inhale 2 puffs into the lungs 2 (two) times daily.  1 Inhaler  11  . butalbital-acetaminophen-caffeine (FIORICET WITH CODEINE) 50-325-40-30 MG per capsule Take 1 capsule by mouth every 6 (six) hours as needed.        . Calcium Carbonate (CALCIUM 500 PO) Take 1 capsule by mouth 2 (two) times daily.        . cetirizine (ZYRTEC) 10 MG tablet Take 1 tablet (10 mg total) by mouth daily.  30 tablet  11  . Cholecalciferol (VITAMIN D) 1000 UNITS capsule Take 1,000 Units by mouth daily.        . cyclobenzaprine (FLEXERIL) 5 MG tablet Take 5 mg by mouth daily.      . fluticasone (FLONASE) 50 MCG/ACT nasal spray Place 1 spray into the nose daily.  16 g  6  . ibuprofen (ADVIL,MOTRIN) 800 MG tablet Take 800 mg by mouth 2 (two) times daily as needed. For knee pain      . ipratropium-albuterol (DUONEB) 0.5-2.5 (3) MG/3ML SOLN Take 3 mLs by nebulization every 6 (six) hours as needed.      [name] levothyroxine (SYNTHROID, LEVOTHROID) 125 MCG tablet Take 125 mcg by mouth daily.         [name] LORazepam (ATIVAN) 1 MG tablet Take 1 tablet (1 mg total) by mouth 2 (two) times daily.  15 tablet  0  . losartan-hydrochlorothiazide (HYZAAR) 100-12.5 MG per tablet Take 1 tablet by mouth daily.  30 tablet  12  . magnesium oxide (MAG-OX) 400 MG tablet Take 400 mg by mouth daily.        [name] omeprazole (PRILOSEC) 20 MG capsule Take 1 capsule (20 mg total) by mouth daily.  30 capsule  5  . pravastatin (PRAVACHOL) 40 MG tablet Take 1 tablet (40 mg total) by mouth at bedtime.  30 tablet  12  . predniSONE (DELTASONE) 10 MG tablet Take 15 mg by mouth daily.      . predniSONE (DELTASONE) 5 MG tablet Take 1 tablet (5 mg total) by mouth daily.  35 tablet  0  . sertraline (ZOLOFT) 50 MG tablet Take 50 mg by mouth 2 (two) times daily. One tab in am and pm      . tiotropium (SPIRIVA) 18 MCG inhalation capsule Place 1 capsule (18 mcg total) into inhaler and inhale daily.  30 capsule  11  . traZODone (DESYREL) 50 MG tablet Take 100 mg by mouth at bedtime.        No current  facility-administered medications for this visit.     Past Medical History  Diagnosis Date  . Supraventricular tachycardia   . MVP (mitral valve prolapse)   . Hypothyroid   . Hypertension   . Aortic aneurysm   . Pulmonary nodule   . Varicose vein   . Arthritis   . Depression   . COPD (chronic obstructive pulmonary disease)   . CAD (coronary artery disease)     Past Surgical History  Procedure Laterality Date  . Appendectomy  1956  . Total abdominal hysterectomy  1975  . Tubal ligation  1972  . Breast surgery      benign left breast cyst removed    History   Social History  . Marital Status: Married    Spouse Name: N/A    Number of Children: N/A  . Years of Education: N/A   Occupational History  . LPN    Social History Main Topics  . Smoking status: Former Smoker -- 1.00 packs/day for 50 years    Types: Cigarettes    Quit date: 07/05/1999  . Smokeless tobacco: Never Used  . Alcohol Use: Not on file    . Drug Use: Not on file  . Sexually Active: Not on file   Other Topics Concern  . Not on file   Social History Narrative   Pt is married and lives with her husband. She works as an Public house manager.  She has not had any known TB exposures.  She is a former smoker.  Quit in 2001.  She has approx 50 pack year total history.  Does not use alcohol     ROS: back pain but no fevers or chills, productive cough, hemoptysis, dysphasia, odynophagia, melena, hematochezia, dysuria, hematuria, rash, seizure activity, orthopnea, PND, pedal edema, claudication. Remaining systems are negative.  Physical Exam: Well-developed chronically ill appearing in no acute distress.  Skin is warm and dry.  HEENT is normal.  Neck is supple.  Chest diminished breath sounds throughout Cardiovascular exam is regular rate and rhythm.  Abdominal exam nontender or distended. No masses palpated. Extremities show trace edema. neuro grossly intact  ECG sinus rhythm at a rate of 104. Normal axis. RV conduction delay. No ST changes.

## 2012-10-30 NOTE — Assessment & Plan Note (Signed)
No recent episodes

## 2012-10-30 NOTE — Assessment & Plan Note (Signed)
Followed by Dr. Tyrone Sage. I doubt she would ever be a good candidate for aortic root replacement given severity of COPD.

## 2012-11-06 DIAGNOSIS — R0602 Shortness of breath: Secondary | ICD-10-CM | POA: Diagnosis not present

## 2012-11-06 DIAGNOSIS — R5383 Other fatigue: Secondary | ICD-10-CM | POA: Diagnosis not present

## 2012-11-06 DIAGNOSIS — R5381 Other malaise: Secondary | ICD-10-CM | POA: Diagnosis not present

## 2012-11-06 DIAGNOSIS — R030 Elevated blood-pressure reading, without diagnosis of hypertension: Secondary | ICD-10-CM | POA: Diagnosis not present

## 2012-11-06 DIAGNOSIS — I712 Thoracic aortic aneurysm, without rupture: Secondary | ICD-10-CM | POA: Diagnosis not present

## 2012-11-13 DIAGNOSIS — E038 Other specified hypothyroidism: Secondary | ICD-10-CM | POA: Diagnosis not present

## 2012-11-13 DIAGNOSIS — R5381 Other malaise: Secondary | ICD-10-CM | POA: Diagnosis not present

## 2012-11-13 DIAGNOSIS — R5383 Other fatigue: Secondary | ICD-10-CM | POA: Diagnosis not present

## 2012-11-21 ENCOUNTER — Encounter: Payer: Self-pay | Admitting: Emergency Medicine

## 2012-11-21 ENCOUNTER — Ambulatory Visit (INDEPENDENT_AMBULATORY_CARE_PROVIDER_SITE_OTHER): Payer: Medicare Other | Admitting: Emergency Medicine

## 2012-11-21 VITALS — BP 138/78 | HR 99 | Temp 98.2°F | Ht 63.0 in | Wt 194.4 lb

## 2012-11-21 DIAGNOSIS — R0602 Shortness of breath: Secondary | ICD-10-CM

## 2012-11-21 DIAGNOSIS — J449 Chronic obstructive pulmonary disease, unspecified: Secondary | ICD-10-CM | POA: Diagnosis not present

## 2012-11-21 DIAGNOSIS — J984 Other disorders of lung: Secondary | ICD-10-CM | POA: Diagnosis not present

## 2012-11-21 MED ORDER — AEROCHAMBER PLUS FLO-VU LARGE MISC: 1.0000 | Freq: Once | Status: DC

## 2012-11-21 MED ORDER — PREDNISONE 20 MG PO TABS: 20.0000 mg | ORAL_TABLET | Freq: Every day | ORAL | Status: DC

## 2012-11-21 NOTE — Patient Instructions (Addendum)
Please start using a spacer with your Symbicort twice a day Continue your other inhaled medications as you are taking them We will increase your prednisone back to 20mg  daily Follow with [name] regarding your thyroid testing and blood counts Follow with Dr Delton Coombes in 6 weeks or sooner if you have any problems

## 2012-11-21 NOTE — Assessment & Plan Note (Signed)
Certainly could be multifactorial  - huge component of her COPD but also probably hypothyroidism, anemia, etc.

## 2012-11-21 NOTE — Addendum Note (Signed)
Addended by: Orma Flaming D on: 11/21/2012 12:59 PM   Modules accepted: Orders

## 2012-11-21 NOTE — Assessment & Plan Note (Signed)
No change by CT scan 11/06/12 at [facility]

## 2012-11-21 NOTE — Assessment & Plan Note (Signed)
-   trial increase prednisone  - add a spacer to symbicort - spiriva + nebs prn.

## 2012-11-21 NOTE — Addendum Note (Signed)
Addended by: Leslye Peer on: 11/21/2012 12:54 PM   Modules accepted: Orders

## 2012-11-21 NOTE — Progress Notes (Signed)
Morgan Hays is a 72 year old woman who follows up today for her COPD, dyspnea, and for a history of a RLL pulmonary nodule. 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 No sgy has been planned due to her significant lung disease. Also with hx depression.   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 Could not tolerate Daliresp. She has been rx with pred + abx x 1 since last visit by PCP. Currently at baseline, O2 is set on 3L/min. She does have some wheezing. Taking loratadine and fluticasone every day.   ROV 07/26/11 -- Severe COPD on O2, chronic cough being rx for allergies and GERD. She is here for regular f/u. She mentions today that she is having some cyanosis, coolness and numbness in her feet for the last 5 -6 months. No w/u done yet, but she is going to mention to her PCP. Breathing has been stable, no flares since last visit. Taking Spiriva + Symbicort + SABA; uses albuterol at least once a day.   ROV 10/31/11 -- Severe COPD on O2, chronic cough being rx for allergies and GERD. Having more nasal sx, gtt, wheezing.  She has not had flare since last time.  Interested in Research scientist (medical).   ROV 02/01/12 -- Severe COPD on O2, chronic cough being rx for allergies and GERD. Since last visit she has undergone Myoview for atypical CP, reassuring study. 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 CT chest > no evidence PE.  Breathing has been stable, still limiting her exertion. Rarely uses SABA.  Spiriva + Symbicort, O2 on 3L/min. She believes that stopping cymbalta has helped her. Some occasional cough.   ROV 06/05/12 -- Severe COPD on O2, chronic cough being rx for allergies and GERD.  CAT score today 23. Spiriva + Symbicort, O2 on 3L/min.  Reports today that her energy is very low, not sure that it is worsening of breathing. She is using albuterol about every day. She had the flu shot. She had prednisone/abx in September. Went to ED in august  after a fall.   ROV 09/21/12 -- Severe COPD on O2, chronic cough being rx for allergies and GERD.  Her breathing is very limiting. She has been rx with pred x 2 since last time.   ROV 10/22/12 -- Severe COPD on O2, chronic cough being rx for allergies and GERD. Returns today reporting worsening dyspnea. We started daily pred a month ago to see if she would benefit. She can tell a difference at rest and w sleeping. No real difference in exertional SOB  ROV 11/21/12 -- Severe COPD, hypoxemia, chronic cough, GERD, allergic chronic rhinitis. We have initiated chronic pred, started tapering last visit, currently 15. Since last time she was evaluated by [name] for increased exertional SOB, overall malaise, was sent for CT chest as below > no PE, no infiltrates, stable emphysema. Also had thyroid studies, showed high TSH and synthroid increased. Her breathing is Ok at rest, worse with exertion.  Using duonebs 2x a day, spiriva + symbicort.     CAT Score 10/22/2012 09/21/2012 06/04/2012  Total CAT Score 26 23 23     PE:  Filed Vitals:   11/21/12 1203  BP: 138/78  Pulse: 99  Temp: 98.2 F (36.8 C)   Gen: Pleasant, obese, in no distress,  Obese female.   ENT: No lesions,  mouth clear,  oropharynx clear, no postnasal drip  Neck: No JVD, no TMG, no  carotid bruits  Lungs: Distant  BS w/ no wheezing, very distant  Cardiovascular: RRR, heart sounds normal, no murmur or gallops, trace edema  Musculoskeletal: No deformities, no cyanosis or clubbing  Neuro: alert, non focal  Skin: Warm, no lesions or rashes   12/19/11 Myoview:  Overall Impression: Normal stress nuclear study with a small, mild, fixed apical defect consistent with thinning; no ischemia.  LV Ejection Fraction: 79%. LV Wall Motion: NL LV Function; NL Wall Motion  CT chest 11/06/12 >>  No PE, centra-lobular emphysema, no infiltrates or nodules, stable thoracic aortic aneurysm   DYSPNEA Certainly could be multifactorial  - huge  component of her COPD but also probably hypothyroidism, anemia, etc.   C O P D - trial increase prednisone  - add a spacer to symbicort - spiriva + nebs prn.

## 2012-12-13 DIAGNOSIS — R609 Edema, unspecified: Secondary | ICD-10-CM | POA: Diagnosis not present

## 2012-12-13 DIAGNOSIS — I10 Essential (primary) hypertension: Secondary | ICD-10-CM | POA: Diagnosis not present

## 2012-12-18 DIAGNOSIS — E038 Other specified hypothyroidism: Secondary | ICD-10-CM | POA: Diagnosis not present

## 2012-12-18 DIAGNOSIS — E039 Hypothyroidism, unspecified: Secondary | ICD-10-CM | POA: Diagnosis not present

## 2012-12-18 DIAGNOSIS — E782 Mixed hyperlipidemia: Secondary | ICD-10-CM | POA: Diagnosis not present

## 2012-12-18 DIAGNOSIS — F329 Major depressive disorder, single episode, unspecified: Secondary | ICD-10-CM | POA: Diagnosis not present

## 2012-12-18 DIAGNOSIS — E785 Hyperlipidemia, unspecified: Secondary | ICD-10-CM | POA: Diagnosis not present

## 2012-12-18 DIAGNOSIS — Z79899 Other long term (current) drug therapy: Secondary | ICD-10-CM | POA: Diagnosis not present

## 2012-12-18 DIAGNOSIS — J449 Chronic obstructive pulmonary disease, unspecified: Secondary | ICD-10-CM | POA: Diagnosis not present

## 2012-12-18 DIAGNOSIS — I10 Essential (primary) hypertension: Secondary | ICD-10-CM | POA: Diagnosis not present

## 2012-12-25 ENCOUNTER — Other Ambulatory Visit: Payer: Self-pay | Admitting: Emergency Medicine

## 2012-12-25 DIAGNOSIS — R05 Cough: Secondary | ICD-10-CM

## 2012-12-25 MED ORDER — FLUTICASONE PROPIONATE 50 MCG/ACT NA SUSP: 2.0000 | Freq: Every day | NASAL | Status: DC

## 2012-12-25 MED ORDER — OMEPRAZOLE 20 MG PO CPDR: 20.0000 mg | DELAYED_RELEASE_CAPSULE | Freq: Every day | ORAL | Status: DC

## 2013-01-02 ENCOUNTER — Encounter: Payer: Self-pay | Admitting: Emergency Medicine

## 2013-01-02 ENCOUNTER — Ambulatory Visit (INDEPENDENT_AMBULATORY_CARE_PROVIDER_SITE_OTHER): Payer: Medicare Other | Admitting: Emergency Medicine

## 2013-01-02 VITALS — BP 138/70 | HR 92 | Temp 98.3°F | Ht 64.0 in | Wt 171.8 lb

## 2013-01-02 DIAGNOSIS — J309 Allergic rhinitis, unspecified: Secondary | ICD-10-CM | POA: Diagnosis not present

## 2013-01-02 DIAGNOSIS — J449 Chronic obstructive pulmonary disease, unspecified: Secondary | ICD-10-CM | POA: Diagnosis not present

## 2013-01-02 NOTE — Patient Instructions (Addendum)
Please continue your Spiriva, Symbicort and DuoNebs.  Wear oxygen at 3L/min at rest, 4L/min when walking.  Continue prednisone at 20mg  daily. We will consider decreasing in the future Continue your nasal sprays.  Follow with Dr Delton Coombes in 3 months or sooner if you have any problems.

## 2013-01-02 NOTE — Assessment & Plan Note (Signed)
No change to nasal sprays

## 2013-01-02 NOTE — Assessment & Plan Note (Signed)
Please continue your Spiriva, Symbicort and DuoNebs.  Wear oxygen at 3L/min at rest, 4L/min when walking.  Continue prednisone at 20mg  daily. We will consider decreasing in the future  Follow with Dr Delton Coombes in 3 months or sooner if you have any problems

## 2013-01-02 NOTE — Progress Notes (Signed)
Morgan Hays is a 72 year old woman who follows up today for her COPD, dyspnea, and for a history of a RLL pulmonary nodule. 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 No sgy has been planned due to her significant lung disease. Also with hx depression.   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 Could not tolerate Daliresp. She has been rx with pred + abx x 1 since last visit by PCP. Currently at baseline, O2 is set on 3L/min. She does have some wheezing. Taking loratadine and fluticasone every day.   ROV 07/26/11 -- Severe COPD on O2, chronic cough being rx for allergies and GERD. She is here for regular f/u. She mentions today that she is having some cyanosis, coolness and numbness in her feet for the last 5 -6 months. No w/u done yet, but she is going to mention to her PCP. Breathing has been stable, no flares since last visit. Taking Spiriva + Symbicort + SABA; uses albuterol at least once a day.   ROV 10/31/11 -- Severe COPD on O2, chronic cough being rx for allergies and GERD. Having more nasal sx, gtt, wheezing.  She has not had flare since last time.  Interested in Research scientist (medical).   ROV 02/01/12 -- Severe COPD on O2, chronic cough being rx for allergies and GERD. Since last visit she has undergone Myoview for atypical CP, reassuring study. 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 CT chest > no evidence PE.  Breathing has been stable, still limiting her exertion. Rarely uses SABA.  Spiriva + Symbicort, O2 on 3L/min. She believes that stopping cymbalta has helped her. Some occasional cough.   ROV 06/05/12 -- Severe COPD on O2, chronic cough being rx for allergies and GERD.  CAT score today 23. Spiriva + Symbicort, O2 on 3L/min.  Reports today that her energy is very low, not sure that it is worsening of breathing. She is using albuterol about every day. She had the flu shot. She had prednisone/abx in September. Went to ED in august  after a fall.   ROV 09/21/12 -- Severe COPD on O2, chronic cough being rx for allergies and GERD.  Her breathing is very limiting. She has been rx with pred x 2 since last time.   ROV 10/22/12 -- Severe COPD on O2, chronic cough being rx for allergies and GERD. Returns today reporting worsening dyspnea. We started daily pred a month ago to see if she would benefit. She can tell a difference at rest and w sleeping. No real difference in exertional SOB  ROV 11/21/12 -- Severe COPD, hypoxemia, chronic cough, GERD, allergic chronic rhinitis. We have initiated chronic pred, started tapering last visit, currently 15. Since last time she was evaluated by [name] for increased exertional SOB, overall malaise, was sent for CT chest as below > no PE, no infiltrates, stable emphysema. Also had thyroid studies, showed high TSH and synthroid increased. Her breathing is Ok at rest, worse with exertion.  Using duonebs 2x a day, spiriva + symbicort.   ROV 01/02/13 -- Severe COPD, hypoxemia, chronic cough, GERD, allergic chronic rhinitis. Her chronic pred is at 20mg  (increased last time). She doesn't notice any real change in breathing. Cough is stable. She wheezes with exertion.      CAT Score 10/22/2012 09/21/2012 06/04/2012  Total CAT Score 26 23 23     PE:  Filed Vitals:   01/02/13 1424  BP: 138/70  Pulse:  92  Temp: 98.3 F (36.8 C)   Gen: Pleasant, obese, in no distress,  Obese female.   ENT: No lesions,  mouth clear,  oropharynx clear, no postnasal drip  Neck: No JVD, no TMG, no carotid bruits  Lungs: Distant  BS w/ no wheezing, very distant  Cardiovascular: RRR, heart sounds normal, no murmur or gallops, trace edema  Musculoskeletal: No deformities, no cyanosis or clubbing  Neuro: alert, non focal  Skin: Warm, no lesions or rashes   12/19/11 Myoview:  Overall Impression: Normal stress nuclear study with a small, mild, fixed apical defect consistent with thinning; no ischemia.  LV Ejection  Fraction: 79%. LV Wall Motion: NL LV Function; NL Wall Motion  CT chest 11/06/12 >>  No PE, centra-lobular emphysema, no infiltrates or nodules, stable thoracic aortic aneurysm   C O P D Please continue your Spiriva, Symbicort and DuoNebs.  Wear oxygen at 3L/min at rest, 4L/min when walking.  Continue prednisone at 20mg  daily. We will consider decreasing in the future  Follow with Dr Delton Coombes in 3 months or sooner if you have any problems  ALLERGIC RHINITIS No change to nasal sprays

## 2013-01-28 DIAGNOSIS — H524 Presbyopia: Secondary | ICD-10-CM | POA: Diagnosis not present

## 2013-01-28 DIAGNOSIS — H26499 Other secondary cataract, unspecified eye: Secondary | ICD-10-CM | POA: Diagnosis not present

## 2013-02-06 ENCOUNTER — Other Ambulatory Visit: Payer: Self-pay | Admitting: *Deleted

## 2013-02-06 MED ORDER — PRAVASTATIN SODIUM 40 MG PO TABS: 40.0000 mg | ORAL_TABLET | Freq: Every day | ORAL | Status: DC

## 2013-02-19 ENCOUNTER — Other Ambulatory Visit: Payer: Self-pay | Admitting: *Deleted

## 2013-02-19 DIAGNOSIS — R5381 Other malaise: Secondary | ICD-10-CM | POA: Diagnosis not present

## 2013-02-19 DIAGNOSIS — R0602 Shortness of breath: Secondary | ICD-10-CM | POA: Diagnosis not present

## 2013-02-19 DIAGNOSIS — I10 Essential (primary) hypertension: Secondary | ICD-10-CM | POA: Diagnosis not present

## 2013-02-19 DIAGNOSIS — E039 Hypothyroidism, unspecified: Secondary | ICD-10-CM | POA: Diagnosis not present

## 2013-02-19 MED ORDER — PREDNISONE 20 MG PO TABS: 20.0000 mg | ORAL_TABLET | Freq: Every day | ORAL | Status: DC

## 2013-02-22 ENCOUNTER — Encounter: Payer: Self-pay | Admitting: Emergency Medicine

## 2013-02-22 ENCOUNTER — Ambulatory Visit (INDEPENDENT_AMBULATORY_CARE_PROVIDER_SITE_OTHER): Payer: Medicare Other | Admitting: Emergency Medicine

## 2013-02-22 VITALS — BP 130/78 | HR 108 | Temp 98.7°F | Ht 64.0 in | Wt 193.8 lb

## 2013-02-22 DIAGNOSIS — J449 Chronic obstructive pulmonary disease, unspecified: Secondary | ICD-10-CM

## 2013-02-22 NOTE — Progress Notes (Signed)
Ms. Kerce is a 72 year old woman who follows up today for her COPD, dyspnea, and for a history of a RLL pulmonary nodule. 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 No sgy has been planned due to her significant lung disease. Also with hx depression.   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 Could not tolerate Daliresp. She has been rx with pred + abx x 1 since last visit by PCP. Currently at baseline, O2 is set on 3L/min. She does have some wheezing. Taking loratadine and fluticasone every day.   ROV 07/26/11 -- Severe COPD on O2, chronic cough being rx for allergies and GERD. She is here for regular f/u. She mentions today that she is having some cyanosis, coolness and numbness in her feet for the last 5 -6 months. No w/u done yet, but she is going to mention to her PCP. Breathing has been stable, no flares since last visit. Taking Spiriva + Symbicort + SABA; uses albuterol at least once a day.   ROV 10/31/11 -- Severe COPD on O2, chronic cough being rx for allergies and GERD. Having more nasal sx, gtt, wheezing.  She has not had flare since last time.  Interested in Research scientist (medical).   ROV 02/01/12 -- Severe COPD on O2, chronic cough being rx for allergies and GERD. Since last visit she has undergone Myoview for atypical CP, reassuring study. 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 CT chest > no evidence PE.  Breathing has been stable, still limiting her exertion. Rarely uses SABA.  Spiriva + Symbicort, O2 on 3L/min. She believes that stopping cymbalta has helped her. Some occasional cough.   ROV 06/05/12 -- Severe COPD on O2, chronic cough being rx for allergies and GERD.  CAT score today 23. Spiriva + Symbicort, O2 on 3L/min.  Reports today that her energy is very low, not sure that it is worsening of breathing. She is using albuterol about every day. She had the flu shot. She had prednisone/abx in September. Went to ED in august  after a fall.   ROV 09/21/12 -- Severe COPD on O2, chronic cough being rx for allergies and GERD.  Her breathing is very limiting. She has been rx with pred x 2 since last time.   ROV 10/22/12 -- Severe COPD on O2, chronic cough being rx for allergies and GERD. Returns today reporting worsening dyspnea. We started daily pred a month ago to see if she would benefit. She can tell a difference at rest and w sleeping. No real difference in exertional SOB  ROV 11/21/12 -- Severe COPD, hypoxemia, chronic cough, GERD, allergic chronic rhinitis. We have initiated chronic pred, started tapering last visit, currently 15. Since last time she was evaluated by [name] for increased exertional SOB, overall malaise, was sent for CT chest as below > no PE, no infiltrates, stable emphysema. Also had thyroid studies, showed high TSH and synthroid increased. Her breathing is Ok at rest, worse with exertion.  Using duonebs 2x a day, spiriva + symbicort.   ROV 01/02/13 -- Severe COPD, hypoxemia, chronic cough, GERD, allergic chronic rhinitis. Her chronic pred is at 20mg  (increased last time). She doesn't notice any real change in breathing. Cough is stable. She wheezes with exertion.    ROV 02/22/13 -- Severe COPD, hypoxemia, chronic cough, GERD, allergic chronic rhinitis, chronic pred 20. She presents today with about 3 weeks of increased DOE, some increased LE edema. No  change in cough or wheeze. Has to rest with 20 ft. Using duonebs 3-4x a day. O2 on 4L/min pulsed (continuous at home). Her wt is stable.     CAT Score 10/22/2012 09/21/2012 06/04/2012  Total CAT Score 26 23 23     PE:  Filed Vitals:   02/22/13 1631  BP: 130/78  Pulse: 108  Temp: 98.7 F (37.1 C)  TempSrc: Oral  Height: 5\' 4"  (1.626 m)  Weight: 193 lb 12.8 oz (87.907 kg)  SpO2: 93%   Gen: Pleasant, obese, in no distress,  Obese female.   ENT: No lesions,  mouth clear,  oropharynx clear, no postnasal drip  Neck: No JVD, no TMG, no carotid  bruits  Lungs: Distant  BS w/ no wheezing, very distant  Cardiovascular: RRR, heart sounds normal, no murmur or gallops, trace edema  Musculoskeletal: No deformities, no cyanosis or clubbing  Neuro: alert, non focal  Skin: Warm, no lesions or rashes   12/19/11 Myoview:  Overall Impression: Normal stress nuclear study with a small, mild, fixed apical defect consistent with thinning; no ischemia.  LV Ejection Fraction: 79%. LV Wall Motion: NL LV Function; NL Wall Motion  CT chest 11/06/12 >>  No PE, centra-lobular emphysema, no infiltrates or nodules, stable thoracic aortic aneurysm   C O P D Not clear why she is worse, suspect this is progression of her disease. She is not exacerbated.   Walking oximetry today on 4L/min Will try changing Symbicort to [facility] for a month to see if you benefit Continue your Spiriva and nebulized medications Continue your prednisone at 20mg  daily.  Follow with Dr Delton Coombes in 1 month

## 2013-02-22 NOTE — Assessment & Plan Note (Signed)
Not clear why she is worse, suspect this is progression of her disease. She is not exacerbated.   Walking oximetry today on 4L/min Will try changing Symbicort to [facility] for a month to see if you benefit Continue your Spiriva and nebulized medications Continue your prednisone at 20mg  daily.  Follow with Dr Delton Coombes in 1 month

## 2013-02-22 NOTE — Patient Instructions (Addendum)
Walking oximetry today on 4L/min Will try changing Symbicort to [facility] for a month to see if you benefit Continue your Spiriva and nebulized medications Continue your prednisone at 20mg  daily.  Follow with Dr Delton Coombes in 1 month

## 2013-03-21 DIAGNOSIS — N9089 Other specified noninflammatory disorders of vulva and perineum: Secondary | ICD-10-CM | POA: Diagnosis not present

## 2013-03-27 ENCOUNTER — Ambulatory Visit (INDEPENDENT_AMBULATORY_CARE_PROVIDER_SITE_OTHER): Payer: Medicare Other | Admitting: Emergency Medicine

## 2013-03-27 ENCOUNTER — Encounter: Payer: Self-pay | Admitting: Emergency Medicine

## 2013-03-27 VITALS — BP 110/60 | HR 98 | Ht 64.0 in | Wt 191.0 lb

## 2013-03-27 DIAGNOSIS — J449 Chronic obstructive pulmonary disease, unspecified: Secondary | ICD-10-CM | POA: Diagnosis not present

## 2013-03-27 DIAGNOSIS — Z23 Encounter for immunization: Secondary | ICD-10-CM

## 2013-03-27 DIAGNOSIS — J4489 Other specified chronic obstructive pulmonary disease: Secondary | ICD-10-CM

## 2013-03-27 MED ORDER — FLUTICASONE FUROATE-VILANTEROL 100-25 MCG/INH IN AEPB: 1.0000 | INHALATION_SPRAY | Freq: Every day | RESPIRATORY_TRACT | Status: DC

## 2013-03-27 NOTE — Progress Notes (Signed)
Morgan Hays is a 72 year old woman who follows up today for her COPD, dyspnea, and for a history of a RLL pulmonary nodule. 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 No sgy has been planned due to her significant lung disease. Also with hx depression.   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 Could not tolerate Daliresp. She has been rx with pred + abx x 1 since last visit by PCP. Currently at baseline, O2 is set on 3L/min. She does have some wheezing. Taking loratadine and fluticasone every day.   ROV 07/26/11 -- Severe COPD on O2, chronic cough being rx for allergies and GERD. She is here for regular f/u. She mentions today that she is having some cyanosis, coolness and numbness in her feet for the last 5 -6 months. No w/u done yet, but she is going to mention to her PCP. Breathing has been stable, no flares since last visit. Taking Spiriva + Symbicort + SABA; uses albuterol at least once a day.   ROV 10/31/11 -- Severe COPD on O2, chronic cough being rx for allergies and GERD. Having more nasal sx, gtt, wheezing.  She has not had flare since last time.  Interested in Research scientist (medical).   ROV 02/01/12 -- Severe COPD on O2, chronic cough being rx for allergies and GERD. Since last visit she has undergone Myoview for atypical CP, reassuring study. 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 CT chest > no evidence PE.  Breathing has been stable, still limiting her exertion. Rarely uses SABA.  Spiriva + Symbicort, O2 on 3L/min. She believes that stopping cymbalta has helped her. Some occasional cough.   ROV 06/05/12 -- Severe COPD on O2, chronic cough being rx for allergies and GERD.  CAT score today 23. Spiriva + Symbicort, O2 on 3L/min.  Reports today that her energy is very low, not sure that it is worsening of breathing. She is using albuterol about every day. She had the flu shot. She had prednisone/abx in September. Went to ED in august  after a fall.   ROV 09/21/12 -- Severe COPD on O2, chronic cough being rx for allergies and GERD.  Her breathing is very limiting. She has been rx with pred x 2 since last time.   ROV 10/22/12 -- Severe COPD on O2, chronic cough being rx for allergies and GERD. Returns today reporting worsening dyspnea. We started daily pred a month ago to see if she would benefit. She can tell a difference at rest and w sleeping. No real difference in exertional SOB  ROV 11/21/12 -- Severe COPD, hypoxemia, chronic cough, GERD, allergic chronic rhinitis. We have initiated chronic pred, started tapering last visit, currently 15. Since last time she was evaluated by [name] for increased exertional SOB, overall malaise, was sent for CT chest as below > no PE, no infiltrates, stable emphysema. Also had thyroid studies, showed high TSH and synthroid increased. Her breathing is Ok at rest, worse with exertion.  Using duonebs 2x a day, spiriva + symbicort.   ROV 01/02/13 -- Severe COPD, hypoxemia, chronic cough, GERD, allergic chronic rhinitis. Her chronic pred is at 20mg  (increased last time). She doesn't notice any real change in breathing. Cough is stable. She wheezes with exertion.    ROV 02/22/13 -- Severe COPD, hypoxemia, chronic cough, GERD, allergic chronic rhinitis, chronic pred 20. She presents today with about 3 weeks of increased DOE, some increased LE edema. No  change in cough or wheeze. Has to rest with 20 ft. Using duonebs 3-4x a day. O2 on 4L/min pulsed (continuous at home). Her wt is stable.   ROV 03/27/13 -- Severe COPD, hypoxemia, chronic cough, GERD, allergic chronic rhinitis, chronic pred 20. Last time we tried changing symbicort to [facility] to see if she would benefit.    CAT Score 10/22/2012 09/21/2012 06/04/2012  Total CAT Score 26 23 23     PE:  Filed Vitals:   03/27/13 1342  BP: 110/60  Pulse: 98  Height: 5\' 4"  (1.626 m)  Weight: 191 lb (86.637 kg)  SpO2: 94%   Gen: Pleasant, obese, in no distress,   Obese female.   ENT: No lesions,  mouth clear,  oropharynx clear, no postnasal drip  Neck: No JVD, no TMG, no carotid bruits  Lungs: Distant  BS w/ no wheezing, very distant  Cardiovascular: RRR, heart sounds normal, no murmur or gallops, trace edema  Musculoskeletal: No deformities, no cyanosis or clubbing  Neuro: alert, non focal  Skin: Warm, no lesions or rashes   12/19/11 Myoview:  Overall Impression: Normal stress nuclear study with a small, mild, fixed apical defect consistent with thinning; no ischemia.  LV Ejection Fraction: 79%. LV Wall Motion: NL LV Function; NL Wall Motion  CT chest 11/06/12 >>  No PE, centra-lobular emphysema, no infiltrates or nodules, stable thoracic aortic aneurysm   C O P D She felt better with the Breo, prefers it to Symbicort. SABA use has decreased.  - will continue spiriva and add breo - o2 at all times.  - flu shot today.  - continue pred 20 for now, will consider decreasing next time if improvement from Breo lasts.  - rov 3

## 2013-03-27 NOTE — Assessment & Plan Note (Addendum)
She felt better with the [facility], prefers it to Symbicort. SABA use has decreased.  - will continue spiriva and add breo - o2 at all times.  - flu shot today.  - continue pred 20 for now, will consider decreasing next time if improvement from Breo lasts.  - rov 3

## 2013-03-27 NOTE — Patient Instructions (Addendum)
We will change symbicort to breo Continue spiriva Use duonebs as needed Wear your oxygen at all times Flu shot today Follow with Dr Delton Coombes in 3 months or sooner if you have any problems.

## 2013-04-02 DIAGNOSIS — J449 Chronic obstructive pulmonary disease, unspecified: Secondary | ICD-10-CM | POA: Diagnosis not present

## 2013-04-02 DIAGNOSIS — E038 Other specified hypothyroidism: Secondary | ICD-10-CM | POA: Diagnosis not present

## 2013-04-02 DIAGNOSIS — I10 Essential (primary) hypertension: Secondary | ICD-10-CM | POA: Diagnosis not present

## 2013-04-02 DIAGNOSIS — N9089 Other specified noninflammatory disorders of vulva and perineum: Secondary | ICD-10-CM | POA: Diagnosis not present

## 2013-04-02 DIAGNOSIS — E782 Mixed hyperlipidemia: Secondary | ICD-10-CM | POA: Diagnosis not present

## 2013-04-02 DIAGNOSIS — E039 Hypothyroidism, unspecified: Secondary | ICD-10-CM | POA: Diagnosis not present

## 2013-04-02 DIAGNOSIS — F329 Major depressive disorder, single episode, unspecified: Secondary | ICD-10-CM | POA: Diagnosis not present

## 2013-04-02 DIAGNOSIS — E785 Hyperlipidemia, unspecified: Secondary | ICD-10-CM | POA: Diagnosis not present

## 2013-04-02 DIAGNOSIS — Z79899 Other long term (current) drug therapy: Secondary | ICD-10-CM | POA: Diagnosis not present

## 2013-04-02 DIAGNOSIS — R233 Spontaneous ecchymoses: Secondary | ICD-10-CM | POA: Diagnosis not present

## 2013-04-09 ENCOUNTER — Ambulatory Visit: Payer: Medicare Other | Admitting: Emergency Medicine

## 2013-05-01 DIAGNOSIS — R5381 Other malaise: Secondary | ICD-10-CM | POA: Diagnosis not present

## 2013-05-01 DIAGNOSIS — R404 Transient alteration of awareness: Secondary | ICD-10-CM | POA: Diagnosis not present

## 2013-05-01 DIAGNOSIS — R4182 Altered mental status, unspecified: Secondary | ICD-10-CM | POA: Diagnosis not present

## 2013-05-01 DIAGNOSIS — W19XXXA Unspecified fall, initial encounter: Secondary | ICD-10-CM | POA: Diagnosis not present

## 2013-05-01 DIAGNOSIS — R0602 Shortness of breath: Secondary | ICD-10-CM | POA: Diagnosis not present

## 2013-05-03 DIAGNOSIS — R4182 Altered mental status, unspecified: Secondary | ICD-10-CM | POA: Diagnosis not present

## 2013-05-03 DIAGNOSIS — S0990XA Unspecified injury of head, initial encounter: Secondary | ICD-10-CM | POA: Diagnosis not present

## 2013-05-03 DIAGNOSIS — R404 Transient alteration of awareness: Secondary | ICD-10-CM | POA: Diagnosis not present

## 2013-05-06 DIAGNOSIS — W19XXXA Unspecified fall, initial encounter: Secondary | ICD-10-CM | POA: Diagnosis not present

## 2013-05-06 DIAGNOSIS — R404 Transient alteration of awareness: Secondary | ICD-10-CM | POA: Diagnosis not present

## 2013-05-06 DIAGNOSIS — D649 Anemia, unspecified: Secondary | ICD-10-CM | POA: Diagnosis not present

## 2013-05-06 DIAGNOSIS — R5381 Other malaise: Secondary | ICD-10-CM | POA: Diagnosis not present

## 2013-05-06 DIAGNOSIS — I10 Essential (primary) hypertension: Secondary | ICD-10-CM | POA: Diagnosis not present

## 2013-05-07 DIAGNOSIS — I671 Cerebral aneurysm, nonruptured: Secondary | ICD-10-CM | POA: Diagnosis not present

## 2013-05-07 DIAGNOSIS — R930 Abnormal findings on diagnostic imaging of skull and head, not elsewhere classified: Secondary | ICD-10-CM | POA: Diagnosis not present

## 2013-05-20 DIAGNOSIS — E038 Other specified hypothyroidism: Secondary | ICD-10-CM | POA: Diagnosis not present

## 2013-05-20 DIAGNOSIS — R6889 Other general symptoms and signs: Secondary | ICD-10-CM | POA: Diagnosis not present

## 2013-05-20 DIAGNOSIS — R209 Unspecified disturbances of skin sensation: Secondary | ICD-10-CM | POA: Diagnosis not present

## 2013-05-20 DIAGNOSIS — D649 Anemia, unspecified: Secondary | ICD-10-CM | POA: Diagnosis not present

## 2013-05-20 DIAGNOSIS — R7989 Other specified abnormal findings of blood chemistry: Secondary | ICD-10-CM | POA: Diagnosis not present

## 2013-05-20 DIAGNOSIS — Q283 Other malformations of cerebral vessels: Secondary | ICD-10-CM | POA: Diagnosis not present

## 2013-05-22 DIAGNOSIS — E663 Overweight: Secondary | ICD-10-CM | POA: Diagnosis not present

## 2013-06-11 DIAGNOSIS — R6889 Other general symptoms and signs: Secondary | ICD-10-CM | POA: Diagnosis not present

## 2013-06-14 ENCOUNTER — Telehealth: Payer: Self-pay | Admitting: Emergency Medicine

## 2013-06-14 MED ORDER — PREDNISONE 20 MG PO TABS: 20.0000 mg | ORAL_TABLET | Freq: Every day | ORAL | Status: DC

## 2013-06-14 NOTE — Telephone Encounter (Signed)
rx has been sent. Nothing further needed 

## 2013-06-21 ENCOUNTER — Ambulatory Visit (INDEPENDENT_AMBULATORY_CARE_PROVIDER_SITE_OTHER): Payer: Medicare Other | Admitting: Emergency Medicine

## 2013-06-21 ENCOUNTER — Encounter: Payer: Self-pay | Admitting: Emergency Medicine

## 2013-06-21 VITALS — BP 120/64 | HR 97 | Ht 65.0 in | Wt 187.0 lb

## 2013-06-21 DIAGNOSIS — J449 Chronic obstructive pulmonary disease, unspecified: Secondary | ICD-10-CM | POA: Diagnosis not present

## 2013-06-21 NOTE — Assessment & Plan Note (Addendum)
Has had confusion, falls. ? Whether this relates to her chronic prednisone. She has been on 15-20mg  for a year.  She is on [place]. She needs script for Proventil. Uses DuoNebs prn.  - considered decreasing her pred as a potential contributor to her other problems. At this time I believe we should leave things alone, let her other workup unfold.  - rov 4

## 2013-06-21 NOTE — Progress Notes (Signed)
Morgan Hays is a 72 year old woman who follows up today for her COPD, dyspnea, and for a history of a RLL pulmonary nodule. She is followed by Dr Morgan Hays for aortic dilation. No sgy has been planned due to her significant lung disease. Also with hx depression.   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 Could not tolerate Daliresp. She has been rx with pred + abx x 1 since last visit by PCP. Currently at baseline, O2 is set on 3L/min. She does have some wheezing. Taking loratadine and fluticasone every day.   ROV 07/26/11 -- Severe COPD on O2, chronic cough being rx for allergies and GERD. She is here for regular f/u. She mentions today that she is having some cyanosis, coolness and numbness in her feet for the last 5 -6 months. No w/u done yet, but she is going to mention to her PCP. Breathing has been stable, no flares since last visit. Taking Spiriva + Symbicort + SABA; uses albuterol at least once a day.   ROV 10/31/11 -- Severe COPD on O2, chronic cough being rx for allergies and GERD. Having more nasal sx, gtt, wheezing.  She has not had flare since last time.  Interested in Research scientist (medical).   ROV 02/01/12 -- Severe COPD on O2, chronic cough being rx for allergies and GERD. Since last visit she has undergone Myoview for atypical CP, reassuring study. 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 CT chest > no evidence PE.  Breathing has been stable, still limiting her exertion. Rarely uses SABA.  Spiriva + Symbicort, O2 on 3L/min. She believes that stopping cymbalta has helped her. Some occasional cough.   ROV 06/05/12 -- Severe COPD on O2, chronic cough being rx for allergies and GERD.  CAT score today 23. Spiriva + Symbicort, O2 on 3L/min.  Reports today that her energy is very low, not sure that it is worsening of breathing. She is using albuterol about every day. She had the flu shot. She had prednisone/abx in September. Went to ED in august  after a fall.   ROV 09/21/12 -- Severe COPD on O2, chronic cough being rx for allergies and GERD.  Her breathing is very limiting. She has been rx with pred x 2 since last time.   ROV 10/22/12 -- Severe COPD on O2, chronic cough being rx for allergies and GERD. Returns today reporting worsening dyspnea. We started daily pred a month ago to see if she would benefit. She can tell a difference at rest and w sleeping. No real difference in exertional SOB  ROV 11/21/12 -- Severe COPD, hypoxemia, chronic cough, GERD, allergic chronic rhinitis. We have initiated chronic pred, started tapering last visit, currently 15. Since last time she was evaluated by Dr Morgan Hays for increased exertional SOB, overall malaise, was sent for CT chest as below > no PE, no infiltrates, stable emphysema. Also had thyroid studies, showed high TSH and synthroid increased. Her breathing is Ok at rest, worse with exertion.  Using duonebs 2x a day, spiriva + symbicort.   ROV 01/02/13 -- Severe COPD, hypoxemia, chronic cough, GERD, allergic chronic rhinitis. Her chronic pred is at 20mg  (increased last time). She doesn't notice any real change in breathing. Cough is stable. She wheezes with exertion.    ROV 02/22/13 -- Severe COPD, hypoxemia, chronic cough, GERD, allergic chronic rhinitis, chronic pred 20. She presents today with about 3 weeks of increased DOE, some increased LE edema. No  change in cough or wheeze. Has to rest with 20 ft. Using duonebs 3-4x a day. O2 on 4L/min pulsed (continuous at home). Her wt is stable.   ROV 03/27/13 -- Severe COPD, hypoxemia, chronic cough, GERD, allergic chronic rhinitis, chronic pred 20. Last time we tried changing symbicort to [facility] to see if she would benefit.   ROV 06/21/13 -- Severe COPD, hypoxemia, chronic cough, GERD, allergic chronic rhinitis, chronic pred 20. She unfortunately has been dealing with with several problems > anemia, abnormal thyroid testing, has had falls since last time. She is to  see NSGY for newly identified aneurysm. She feel like she loses power in her legs, not really a balance problem. He O2 is set on 3-4L/min. Happens going from seated to standing position. She has had some disorientation, ? Related to pred.    CAT Score 10/22/2012 09/21/2012 06/04/2012  Total CAT Score 26 23 23     PE:  Filed Vitals:   06/21/13 1149  BP: 120/64  Pulse: 97  Height: 5\' 5"  (1.651 m)  Weight: 187 lb (84.823 kg)  SpO2: 93%   Gen: Pleasant, obese, in no distress,  Obese female.   ENT: No lesions,  mouth clear,  oropharynx clear, no postnasal drip  Neck: No JVD, no TMG, no carotid bruits  Lungs: Distant  BS w/ no wheezing, very distant  Cardiovascular: RRR, heart sounds normal, no murmur or gallops, trace edema  Musculoskeletal: No deformities, no cyanosis or clubbing  Neuro: alert, non focal  Skin: Warm, no lesions or rashes   12/19/11 Myoview:  Overall Impression: Normal stress nuclear study with a small, mild, fixed apical defect consistent with thinning; no ischemia.  LV Ejection Fraction: 79%. LV Wall Motion: NL LV Function; NL Wall Motion  CT chest 11/06/12 >>  No PE, centra-lobular emphysema, no infiltrates or nodules, stable thoracic aortic aneurysm   C O P D Has had confusion, falls. ? Whether this relates to her chronic prednisone. She has been on 15-20mg  for a year.  She is on [place]. She needs script for Proventil. Uses DuoNebs prn.  - considered decreasing her pred as a potential contributor to her other problems. At this time I believe we should leave things alone, let her other workup unfold.  - rov 4

## 2013-06-21 NOTE — Patient Instructions (Signed)
Please continue your current medications as ordered.  Wear your oxygen at all times Follow with Dr Delton Coombes in 4 months or sooner if you have any problems.

## 2013-07-02 ENCOUNTER — Other Ambulatory Visit: Payer: Self-pay | Admitting: *Deleted

## 2013-07-02 DIAGNOSIS — I712 Thoracic aortic aneurysm, without rupture: Secondary | ICD-10-CM

## 2013-07-09 ENCOUNTER — Telehealth: Payer: Self-pay | Admitting: Emergency Medicine

## 2013-07-09 ENCOUNTER — Other Ambulatory Visit: Payer: Self-pay

## 2013-07-09 DIAGNOSIS — R05 Cough: Secondary | ICD-10-CM

## 2013-07-09 DIAGNOSIS — J449 Chronic obstructive pulmonary disease, unspecified: Secondary | ICD-10-CM

## 2013-07-09 DIAGNOSIS — R059 Cough, unspecified: Secondary | ICD-10-CM

## 2013-07-09 MED ORDER — OMEPRAZOLE 20 MG PO CPDR: 20.0000 mg | DELAYED_RELEASE_CAPSULE | Freq: Every day | ORAL | Status: DC

## 2013-07-09 MED ORDER — FLUTICASONE PROPIONATE 50 MCG/ACT NA SUSP: 2.0000 | Freq: Every day | NASAL | Status: DC

## 2013-07-09 MED ORDER — FLUTICASONE FUROATE-VILANTEROL 100-25 MCG/INH IN AEPB: 1.0000 | INHALATION_SPRAY | Freq: Every day | RESPIRATORY_TRACT | Status: DC

## 2013-07-09 MED ORDER — PRAVASTATIN SODIUM 40 MG PO TABS: 40.0000 mg | ORAL_TABLET | Freq: Every day | ORAL | Status: DC

## 2013-07-09 NOTE — Telephone Encounter (Signed)
Spoke with pt. She advised me she needed refills on flonase, breo, and omerpazole. These have been sent. Nothing further needed

## 2013-07-16 DIAGNOSIS — Q283 Other malformations of cerebral vessels: Secondary | ICD-10-CM | POA: Diagnosis not present

## 2013-07-16 DIAGNOSIS — N390 Urinary tract infection, site not specified: Secondary | ICD-10-CM | POA: Diagnosis not present

## 2013-07-16 DIAGNOSIS — E039 Hypothyroidism, unspecified: Secondary | ICD-10-CM | POA: Diagnosis not present

## 2013-07-16 DIAGNOSIS — Z79899 Other long term (current) drug therapy: Secondary | ICD-10-CM | POA: Diagnosis not present

## 2013-07-16 DIAGNOSIS — E038 Other specified hypothyroidism: Secondary | ICD-10-CM | POA: Diagnosis not present

## 2013-07-16 DIAGNOSIS — E785 Hyperlipidemia, unspecified: Secondary | ICD-10-CM | POA: Diagnosis not present

## 2013-07-16 DIAGNOSIS — N3000 Acute cystitis without hematuria: Secondary | ICD-10-CM | POA: Diagnosis not present

## 2013-07-16 DIAGNOSIS — E782 Mixed hyperlipidemia: Secondary | ICD-10-CM | POA: Diagnosis not present

## 2013-07-16 DIAGNOSIS — R7989 Other specified abnormal findings of blood chemistry: Secondary | ICD-10-CM | POA: Diagnosis not present

## 2013-07-16 DIAGNOSIS — I10 Essential (primary) hypertension: Secondary | ICD-10-CM | POA: Diagnosis not present

## 2013-08-07 DIAGNOSIS — I712 Thoracic aortic aneurysm, without rupture, unspecified: Secondary | ICD-10-CM | POA: Diagnosis not present

## 2013-08-07 LAB — CREATININE, SERUM: Creat: 0.84 mg/dL (ref 0.50–1.10)

## 2013-08-07 LAB — BUN: BUN: 21 mg/dL (ref 6–23)

## 2013-08-08 ENCOUNTER — Encounter: Payer: Self-pay | Admitting: Cardiothoracic Surgery

## 2013-08-08 ENCOUNTER — Ambulatory Visit
Admission: RE | Admit: 2013-08-08 | Discharge: 2013-08-08 | Disposition: A | Discharge status: Home / Self Care | Payer: Medicare Other | Source: Ambulatory Visit | Attending: Cardiothoracic Surgery | Admitting: Cardiothoracic Surgery

## 2013-08-08 ENCOUNTER — Ambulatory Visit (INDEPENDENT_AMBULATORY_CARE_PROVIDER_SITE_OTHER): Payer: Medicare Other | Admitting: Cardiothoracic Surgery

## 2013-08-08 VITALS — BP 148/83 | HR 96 | Resp 20 | Ht 65.0 in | Wt 184.0 lb

## 2013-08-08 DIAGNOSIS — I712 Thoracic aortic aneurysm, without rupture, unspecified: Secondary | ICD-10-CM

## 2013-08-08 DIAGNOSIS — I2510 Atherosclerotic heart disease of native coronary artery without angina pectoris: Secondary | ICD-10-CM

## 2013-08-08 DIAGNOSIS — I77810 Thoracic aortic ectasia: Secondary | ICD-10-CM | POA: Diagnosis not present

## 2013-08-08 MED ORDER — IOHEXOL 350 MG/ML SOLN
80.0000 mL | Freq: Once | INTRAVENOUS | Status: AC | PRN
  Administered 2013-08-08: 80 mL via INTRAVENOUS

## 2013-08-08 NOTE — Progress Notes (Signed)
Patient ID: Morgan Hays, female   DOB: 10/22/1940, 73 y.o.   MRN: [id number]                                             Morgan LeepJudy Hays Morgan Hays Date of Birth: 07/20/1940  Referring: Gerre Pebblesavis, Sally, PA-C Primary Care: Miki KinsAVIS,SALLY, PA-C Pulmonology: Dr. Delton CoombesByrum Cardiology: Dr. Jens Somrenshaw  Chief Complaint:    Chief Complaint  Patient presents with  . Follow-up    1 year f/u with CTA Chest    History of Present Illness:     Patient returns with followup CTs of the chest to evaluate the size of her dilated ascending and descending aorta. She is followed both by pulmonology and cardiology. The patient remains very limited on home oxygen, and very sedentary.  Current Activity/ Functional Status: Patient is very limited in her functional ability notes that she becomes short of breath walking less than 100 feet. His generalized weakness. Notes it should become short of breath just sweeping the floor in her house and is not engaged in much physical activity primarily secondary to shortness of breath.  Zubrod Score: At the time of surgery this patient's most appropriate activity status/level should be described as: []     0    Normal activity, no symptoms []     1    Restricted in physical strenuous activity but ambulatory, able to do out light work []     2    Ambulatory and capable of self care, unable to do work activities, up and about >50 % of waking hours                              [x]     3    Only limited self care, in bed greater than 50% of waking hours []     4    Completely disabled, no self care, confined to bed or chair []     5    Moribund   Past Medical History  Diagnosis Date  . Supraventricular tachycardia   . MVP (mitral valve prolapse)   . Hypothyroid   . Hypertension   . Aortic aneurysm   . Pulmonary nodule   . Varicose vein   . Arthritis   . Depression   . COPD (chronic obstructive pulmonary disease)   . CAD (coronary artery disease)      Past Surgical History  Procedure Laterality Date  . Appendectomy  1956  . Total abdominal hysterectomy  1975  . Tubal ligation  1972  . Breast surgery      benign left breast cyst removed    History  Smoking status  . Former Smoker -- 1.00 packs/day for 50 years  . Types: Cigarettes  . Quit date: 07/05/1999  Smokeless tobacco  . Never Used   History  Alcohol Use: Not on file    History   Social History  . Marital Status: Married    Spouse Name: N/A    Number of Children: N/A  . Years of Education: N/A   Occupational History  . LPN    Social History Main Topics  . Smoking status: Former Smoker -- 1.0 packs/day for 50 years    Types: Cigarettes    Quit date: 07/05/1999  . Smokeless tobacco: Not on file  . Alcohol Use: Not  on file  . Drug Use: Not on file  . Sexually Active: Not on file    Social History Narrative   Pt is married and lives with her husband. She works as an Public house manager.  She has not had any known TB exposures.  She is a former smoker.  Quit in 2001.  She has approx 50 pack year total history.  Does not use alcohol     Allergies  Allergen Reactions  . Ambien [Zolpidem Tartrate]   . Cymbalta [Duloxetine Hcl]     Intolerance   . Daliresp [Roflumilast] Diarrhea  . Doxycycline Nausea And Vomiting  . Lisinopril   . Lopressor [Metoprolol Tartrate]     Intolerance     Current Outpatient Prescriptions  Medication Sig Dispense Refill  . albuterol (PROVENTIL HFA) 108 (90 BASE) MCG/ACT inhaler Inhale 2 puffs into the lungs every 6 (six) hours as needed.        [name] albuterol (PROVENTIL) (2.5 MG/3ML) 0.083% nebulizer solution Take 2.5 mg by nebulization 2 (two) times daily.        [name] aspirin 81 MG tablet Take 81 mg by mouth daily.        . budesonide-formoterol (SYMBICORT) 160-4.5 MCG/ACT inhaler Inhale 2 puffs into the lungs 2 (two) times daily.        . butalbital-acetaminophen-caffeine (FIORICET WITH CODEINE) 50-325-40-30 MG per capsule Take 1 capsule by  mouth every 6 (six) hours as needed.        . Calcium Carbonate (CALCIUM 500 PO) Take 1 capsule by mouth 2 (two) times daily.        . Cholecalciferol (VITAMIN D) 1000 UNITS capsule Take 1,000 Units by mouth daily.        . fluticasone (FLONASE) 50 MCG/ACT nasal spray Place 2 sprays into the nose daily.  16 g  5  . levothyroxine (SYNTHROID, LEVOTHROID) 125 MCG tablet Take 125 mcg by mouth daily.        [name] loratadine (CLARITIN) 10 MG tablet Take 10 mg by mouth daily.        [name] LORazepam (ATIVAN) 1 MG tablet Take 1 tablet (1 mg total) by mouth 2 (two) times daily.  15 tablet  0  . magnesium oxide (MAG-OX) 400 MG tablet Take 400 mg by mouth daily.        [name] olmesartan-hydrochlorothiazide (BENICAR HCT) 40-12.5 MG per tablet Take 1 tablet by mouth daily.  30 tablet  12  . omeprazole (PRILOSEC) 20 MG capsule Take 1 capsule (20 mg total) by mouth daily.  30 capsule  5  . ondansetron (ZOFRAN) 4 MG tablet Take 1 tablet (4 mg total) by mouth every 8 (eight) hours as needed for nausea.  20 tablet  0  . pravastatin (PRAVACHOL) 40 MG tablet Take 40 mg by mouth daily.        . sertraline (ZOLOFT) 50 MG tablet Take 100 mg by mouth 2 (two) times daily.       [name] tiotropium (SPIRIVA) 18 MCG inhalation capsule Place 18 mcg into inhaler and inhale daily.        . traZODone (DESYREL) 50 MG tablet Take 50 mg by mouth at bedtime.             Family History  Problem Relation Age of Onset  . Heart disease Father   . Heart disease Son   . Heart disease Mother   . Heart disease Other     grandfather      Review of Systems:  Cardiac Review of Systems: Y or N  Chest Pain [  n  ]  Resting SOB [ y  ] Exertional SOB  [name]  ]  Orthopnea [name]  ]   Pedal Edema [name]   ]    Palpitations [name]  ] Syncope  [name]  ]   Presyncope [ n  ]  General Review of Systems: [Y] = yes [  ]=no Constitional: recent weight change [ n ]; anorexia [  ]; fatigue [ y ]; nausea [  ]; night sweats [  ]; fever [ n ]; or chills [ n ];                                                                                                                                           Dental: poor dentition[  ];  Eye : blurred vision [  ]; diplopia [   ]; vision changes [  ];  Amaurosis fugax[  ]; Resp: cough [ y];  wheezing[ y ];  hemoptysis[n  ]; shortness of breath[y  ]; paroxysmal nocturnal dyspnea[  ]; dyspnea on exertion[ y ]; or orthopnea[  y];  GI:  gallstones[  ], vomiting[  ];  dysphagia[  ]; melena[  ];  hematochezia [  ]; heartburn[  ];   Hx of  Colonoscopy[  ]; GU: kidney stones [  ]; hematuria[  ];   dysuria [  ];  nocturia[  ];  history of     obstruction [  ];             Skin: rash, swelling[  ];, hair loss[  ];  peripheral edema[  ];  or itching[  ]; Musculosketetal: myalgias[  ];  joint swelling[  ];  joint erythema[  ];  joint pain[  ];  back pain[  ];  Heme/Lymph: bruising[  ];  bleeding[  ];  anemia[  ];  Neuro: TIA[  ];  headaches[  ];  stroke[  ];  vertigo[  ];  seizures[  ];   paresthesias[  ];  difficulty walking[yes complains of fibromyalgia   ];  Psych:depression[  ]; anxiety[  ];  Endocrine: diabetes[  ];  thyroid dysfunction[  ];  Immunizations: Flu [ y ]; Pneumococcal[y  ];  Other:  Physical Exam: BP 148/83  Pulse 96  Resp 20  Ht 5\' 5"  (1.651 m)  Wt 184 lb (83.462 kg)  BMI 30.62 kg/m2  SpO2 94%  General appearance: alert, appears older than stated age, no distress, moderately obese and Short of breath at rest on oxygen in the office. She gets short of breath walking from the lobby to the exam room Neurologic: intact Heart: regular rate and rhythm, S1, S2 normal, no murmur, click, rub or gallop do not hear murmur of AS Lungs: diminished breath sounds bilaterally Abdomen: soft, non-tender; bowel sounds normal; no masses,  no organomegaly Extremities:  extremities normal, atraumatic, no cyanosis or edema and Homans sign is negative, no sign of DVT   Diagnostic Studies & Laboratory data:     Recent Radiology Findings:    Ct Angio Chest Aorta W/cm &/or Wo/cm  08/08/2013   CLINICAL DATA:  Followup evaluation of thoracic aortic aneurysm.  EXAM: CT ANGIOGRAPHY CHEST WITH CONTRAST  TECHNIQUE: Multidetector CT imaging of the chest was performed using the standard protocol during bolus administration of intravenous contrast. Multiplanar CT image reconstructions including MIPs were obtained to evaluate the vascular anatomy.  CONTRAST:  80mL OMNIPAQUE IOHEXOL 350 MG/ML SOLN  COMPARISON:  Chest CTA 11/06/2012.  FINDINGS: Mediastinum: Again noted is multifocal fusiform aneurysmal dilatation of the thoracic aorta. In the ascending thoracic aorta this measures up to 5.4 cm in diameter on image 62 of series 4 (unchanged when compared image 111 of series 3 of the prior study from 11/06/2012). In the proximal descending thoracic aorta this measures up to 4.6 cm in diameter on image 45 of series 4 (unchanged when compared to prior study on image 77 of series 3). Mild thickening and calcification of the aortic valve. No evidence of dissection at this time. Atherosclerotic calcifications are noted in the left anterior descending, left circumflex and right coronary arteries. Heart size is normal. There is no significant pericardial fluid, thickening or pericardial calcification. No pathologically enlarged mediastinal or hilar lymph nodes. Esophagus is unremarkable in appearance.  Lungs/Pleura: Mild centrilobular and paraseptal emphysema. Mild diffuse bronchial wall thickening. No acute consolidative airspace disease. No pleural effusions. No suspicious appearing pulmonary nodules or masses are identified. Mild scarring in the inferior aspect of the lingula.  Upper Abdomen: Unremarkable.  Musculoskeletal: There are no aggressive appearing lytic or blastic lesions noted in the visualized portions of the skeleton.  Review of the MIP images confirms the above findings.  IMPRESSION: 1. Unchanged aneurysmal dilatation of the thoracic aorta measuring up to  5.4 cm in the ascending aorta and 4.6 cm in the descending thoracic aorta, as detailed above. In addition, there is three-vessel coronary artery disease. 2. Mild diffuse bronchial wall thickening with mild centrilobular and paraseptal emphysema; imaging findings suggestive of underlying COPD.   Electronically Signed   By: Trudie Reed M.D.   On: 08/08/2013 12:53      Recent Lab Findings: Lab Results  Component Value Date   WBC 7.8 11/29/2006   HGB 12.3 11/29/2006   HCT 36.4 11/29/2006   PLT 313 11/29/2006   GLUCOSE 88 12/12/2011   CHOL 145 05/31/2010   TRIG 119.0 05/31/2010   HDL 46.60 05/31/2010   LDLCALC 75 05/31/2010   ALT 19 05/31/2010   AST 22 05/31/2010   NA 137 12/12/2011   K 4.2 12/12/2011   CL 95* 12/12/2011   CREATININE 0.84 08/07/2013   BUN 21 08/07/2013   CO2 35* 12/12/2011   TSH 1.71 09/17/2007   INR 1.1 RATIO 11/29/2006   Aortic Size Index=     5cm    /Body surface area is 1.96 meters squared. = 2.55  < 2.75 cm/m2      4% risk per year 2.75 to 4.25          8% risk per year > 4.25 cm/m2    20% risk per year     Assessment / Plan:     Patient is a 73 year old female with severe underlying pulmonary disease who returns for followup CT of the chest. She has a known ascending aortic aneurysm at approximately 5 cm. Reviewing  the scans from 2008 there's been just very slight change. With her significant underlying pulmonary disease and her desire not to have any major invasive procedures done I recommended that we continue with observation and blood pressure control. She is aware of the risk of sudden dissection and/or rupture. I'll plan to see her back in one year with a followup CT scan. The patient has again confirmed this year she does not wish to have operative intervention for repair of enlarged aorta.      Delight Ovens MD 08/08/2013 1:44 PM

## 2013-08-19 DIAGNOSIS — E039 Hypothyroidism, unspecified: Secondary | ICD-10-CM | POA: Diagnosis not present

## 2013-09-04 DIAGNOSIS — S99919A Unspecified injury of unspecified ankle, initial encounter: Secondary | ICD-10-CM | POA: Diagnosis not present

## 2013-09-04 DIAGNOSIS — N39498 Other specified urinary incontinence: Secondary | ICD-10-CM | POA: Diagnosis not present

## 2013-09-04 DIAGNOSIS — R5383 Other fatigue: Secondary | ICD-10-CM | POA: Diagnosis not present

## 2013-09-04 DIAGNOSIS — L0231 Cutaneous abscess of buttock: Secondary | ICD-10-CM | POA: Diagnosis not present

## 2013-09-04 DIAGNOSIS — R7301 Impaired fasting glucose: Secondary | ICD-10-CM | POA: Diagnosis not present

## 2013-09-04 DIAGNOSIS — S8990XA Unspecified injury of unspecified lower leg, initial encounter: Secondary | ICD-10-CM | POA: Diagnosis not present

## 2013-09-04 DIAGNOSIS — M159 Polyosteoarthritis, unspecified: Secondary | ICD-10-CM | POA: Diagnosis not present

## 2013-09-04 DIAGNOSIS — M79609 Pain in unspecified limb: Secondary | ICD-10-CM | POA: Diagnosis not present

## 2013-09-04 DIAGNOSIS — R5381 Other malaise: Secondary | ICD-10-CM | POA: Diagnosis not present

## 2013-09-04 DIAGNOSIS — L03317 Cellulitis of buttock: Secondary | ICD-10-CM | POA: Diagnosis not present

## 2013-09-04 DIAGNOSIS — E039 Hypothyroidism, unspecified: Secondary | ICD-10-CM | POA: Diagnosis not present

## 2013-09-17 ENCOUNTER — Other Ambulatory Visit: Payer: Self-pay | Admitting: *Deleted

## 2013-09-17 MED ORDER — PREDNISONE 20 MG PO TABS: 20.0000 mg | ORAL_TABLET | Freq: Every day | ORAL | Status: DC

## 2013-09-23 ENCOUNTER — Other Ambulatory Visit: Payer: Self-pay | Admitting: Cardiology

## 2013-09-23 DIAGNOSIS — N3000 Acute cystitis without hematuria: Secondary | ICD-10-CM | POA: Diagnosis not present

## 2013-09-25 ENCOUNTER — Other Ambulatory Visit: Payer: Self-pay | Admitting: *Deleted

## 2013-09-25 DIAGNOSIS — J449 Chronic obstructive pulmonary disease, unspecified: Secondary | ICD-10-CM

## 2013-09-25 MED ORDER — ALBUTEROL SULFATE HFA 108 (90 BASE) MCG/ACT IN AERS: 2.0000 | INHALATION_SPRAY | RESPIRATORY_TRACT | Status: DC | PRN

## 2013-10-01 ENCOUNTER — Telehealth: Payer: Self-pay | Admitting: Emergency Medicine

## 2013-10-01 NOTE — Telephone Encounter (Signed)
Called spoke with pt. appt scheduled to see MW in AM at 11:30 for acute visit. Nothing further needed

## 2013-10-02 ENCOUNTER — Telehealth: Payer: Self-pay | Admitting: Emergency Medicine

## 2013-10-02 ENCOUNTER — Ambulatory Visit (INDEPENDENT_AMBULATORY_CARE_PROVIDER_SITE_OTHER)
Admission: RE | Admit: 2013-10-02 | Discharge: 2013-10-02 | Disposition: A | Discharge status: Home / Self Care | Payer: Medicare Other | Source: Ambulatory Visit | Attending: Internal Medicine | Admitting: Internal Medicine

## 2013-10-02 ENCOUNTER — Ambulatory Visit (INDEPENDENT_AMBULATORY_CARE_PROVIDER_SITE_OTHER): Payer: Medicare Other | Admitting: Internal Medicine

## 2013-10-02 ENCOUNTER — Encounter: Payer: Self-pay | Admitting: Internal Medicine

## 2013-10-02 VITALS — BP 118/60 | HR 106 | Temp 98.2°F | Ht 64.0 in | Wt 183.0 lb

## 2013-10-02 DIAGNOSIS — R079 Chest pain, unspecified: Secondary | ICD-10-CM

## 2013-10-02 DIAGNOSIS — J449 Chronic obstructive pulmonary disease, unspecified: Secondary | ICD-10-CM | POA: Diagnosis not present

## 2013-10-02 DIAGNOSIS — J4489 Other specified chronic obstructive pulmonary disease: Secondary | ICD-10-CM

## 2013-10-02 DIAGNOSIS — J9610 Chronic respiratory failure, unspecified whether with hypoxia or hypercapnia: Secondary | ICD-10-CM | POA: Diagnosis not present

## 2013-10-02 DIAGNOSIS — I712 Thoracic aortic aneurysm, without rupture, unspecified: Secondary | ICD-10-CM | POA: Diagnosis not present

## 2013-10-02 DIAGNOSIS — I2510 Atherosclerotic heart disease of native coronary artery without angina pectoris: Secondary | ICD-10-CM | POA: Diagnosis not present

## 2013-10-02 NOTE — Telephone Encounter (Signed)
Advised pt of cxr result per MW.  Pt verbalized understanding and had no question

## 2013-10-02 NOTE — Progress Notes (Signed)
Morgan Hays is a 73 year old woman who follows up today for her COPD, dyspnea, and for a history of a RLL pulmonary nodule. 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 No sgy has been planned due to her significant lung disease. Also with hx depression.   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 Could not tolerate Daliresp. She has been rx with pred + abx x 1 since last visit by PCP. Currently at baseline, O2 is set on 3L/min. She does have some wheezing. Taking loratadine and fluticasone every day.   ROV 07/26/11 -- Severe COPD on O2, chronic cough being rx for allergies and GERD. She is here for regular f/u. She mentions today that she is having some cyanosis, coolness and numbness in her feet for the last 5 -6 months. No w/u done yet, but she is going to mention to her PCP. Breathing has been stable, no flares since last visit. Taking Spiriva + Symbicort + SABA; uses albuterol at least once a day.   ROV 10/31/11 -- Severe COPD on O2, chronic cough being rx for allergies and GERD. Having more nasal sx, gtt, wheezing.  She has not had flare since last time.  Interested in Research scientist (medical).   ROV 02/01/12 -- Severe COPD on O2, chronic cough being rx for allergies and GERD. Since last visit she has undergone Myoview for atypical CP, reassuring study. 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 CT chest > no evidence PE.  Breathing has been stable, still limiting her exertion. Rarely uses SABA.  Spiriva + Symbicort, O2 on 3L/min. She believes that stopping cymbalta has helped her. Some occasional cough.   ROV 06/05/12 -- Severe COPD on O2, chronic cough being rx for allergies and GERD.  CAT score today 23. Spiriva + Symbicort, O2 on 3L/min.  Reports today that her energy is very low, not sure that it is worsening of breathing. She is using albuterol about every day. She had the flu shot. She had prednisone/abx in September. Went to ED in august  after a fall.   ROV 09/21/12 -- Severe COPD on O2, chronic cough being rx for allergies and GERD.  Her breathing is very limiting. She has been rx with pred x 2 since last time.   ROV 10/22/12 -- Severe COPD on O2, chronic cough being rx for allergies and GERD. Returns today reporting worsening dyspnea. We started daily pred a month ago to see if she would benefit. She can tell a difference at rest and w sleeping. No real difference in exertional SOB  ROV 11/21/12 -- Severe COPD, hypoxemia, chronic cough, GERD, allergic chronic rhinitis. We have initiated chronic pred, started tapering last visit, currently 15. Since last time she was evaluated by [name] for increased exertional SOB, overall malaise, was sent for CT chest as below > no PE, no infiltrates, stable emphysema. Also had thyroid studies, showed high TSH and synthroid increased. Her breathing is Ok at rest, worse with exertion.  Using duonebs 2x a day, spiriva + symbicort.   ROV 01/02/13 -- Severe COPD, hypoxemia, chronic cough, GERD, allergic chronic rhinitis. Her chronic pred is at 20mg  (increased last time). She doesn't notice any real change in breathing. Cough is stable. She wheezes with exertion.    ROV 02/22/13 -- Severe COPD, hypoxemia, chronic cough, GERD, allergic chronic rhinitis, chronic pred 20. She presents today with about 3 weeks of increased DOE, some increased LE edema. No  change in cough or wheeze. Has to rest with 20 ft. Using duonebs 3-4x a day. O2 on 4L/min pulsed (continuous at home). Her wt is stable.   ROV 03/27/13 -- Severe COPD, hypoxemia, chronic cough, GERD, allergic chronic rhinitis, chronic pred 20. Last time we tried changing symbicort to [facility] to see if she would benefit.   ROV 06/21/13 -- Severe COPD, hypoxemia, chronic cough, GERD, allergic chronic rhinitis, chronic pred 20. She unfortunately has been dealing with with several problems > anemia, abnormal thyroid testing, has had falls since last time. She is to  see NSGY for newly identified aneurysm. She feel like she loses power in her legs, not really a balance problem. He O2 is set on 3-4L/min. Happens going from seated to standing position. She has had some disorientation, ? Related to pred.    CAT Score 10/22/2012 09/21/2012 06/04/2012  Total CAT Score 26 23 23      10/02/2013 acute  ov/Morgan Hays re:  4lpm NP 24/7 and prednisone 20 mg daily plus breo/spiriva and prn duoneb Chief Complaint  Patient presents with  . Acute Visit    Pt c/o pain in back "feels like someone stabbing me" x 4-5 days- occurs when she takes a deep breath.  She also c/o increased cough x 2 days- prod with thick, green sputum.   finished cipro one day prior to OV  For UTI, no diarrea.  No obvious day to day or daytime variabilty or assoc   chest tightness, subjective wheeze overt sinus or hb symptoms. No unusual exp hx or h/o childhood pna/ asthma or knowledge of premature birth.  Sleeping ok without nocturnal  or early am exacerbation  of respiratory  c/o's or need for noct saba. Also denies any obvious fluctuation of symptoms with weather or environmental changes or other aggravating or alleviating factors except as outlined above   Current Medications, Allergies, Complete Past Medical History, Past Surgical History, Family History, and Social History were reviewed in [employer] record.  ROS  The following are not active complaints unless bolded sore throat, dysphagia, dental problems, itching, sneezing,  nasal congestion or excess/ purulent secretions, ear ache,   fever, chills, sweats, unintended wt loss, pleuritic or exertional cp, hemoptysis,  orthopnea pnd or leg swelling, presyncope, palpitations, heartburn, abdominal pain, anorexia, nausea, vomiting, diarrhea  or change in bowel or urinary habits, change in stools or urine, dysuria,hematuria,  rash, arthralgias, visual complaints, headache, numbness weakness or ataxia or problems with walking or  coordination,  change in mood/affect or memory.              PE:  amb pleasant wf nad  Wt Readings from Last 3 Encounters:  10/02/13 183 lb (83.008 kg)  08/08/13 184 lb (83.462 kg)  06/21/13 187 lb (84.823 kg)      Gen: Pleasant, obese, in no distress,  Obese female.   ENT: No lesions,  mouth clear,  oropharynx clear, no postnasal drip  Neck: No JVD, no TMG, no carotid bruits  Lungs: Distant  BS w/ no wheezing, very distant  Cardiovascular: RRR, heart sounds normal, no murmur or gallops, trace edema  Musculoskeletal: Pos tenderness mid to lower T spine midline only to light palpation  Neuro: alert, non focal  Skin: Warm, no lesions or rashes  MS  Tender over midline around T 8-10 area      CT chest 11/06/12 >>  No PE, centra-lobular emphysema, no infiltrates or nodules, stable thoracic aortic aneurysm  CXR  10/02/2013 :  No acute cardiopulmonary abnormality. Chronic thoracic aortic aneurysm, see chest CTA report 08/08/2013.

## 2013-10-02 NOTE — Patient Instructions (Signed)
Fioricet with codeine up to one very 4 hours as need for pain or cough  Please remember to go to the xray department downstairs for your tests - we will call you with the results when they are available.

## 2013-10-02 NOTE — Progress Notes (Signed)
Quick Note:  LMTCB ______ 

## 2013-10-03 DIAGNOSIS — M549 Dorsalgia, unspecified: Secondary | ICD-10-CM | POA: Insufficient documentation

## 2013-10-03 NOTE — Assessment & Plan Note (Signed)
Superficial tenderness in midline so musculoskeletal origin likely > ? t spine compression fx but not obvious on cxr   rec heat/ codeine, may need MRI t spine if not improving with conservative rx.

## 2013-10-03 NOTE — Assessment & Plan Note (Deleted)
Superficial tenderness in midline so musculoskeletal origin likely > ? t spine compression fx but not obvious on cxr   rec heat/ codeine, may need MRI t spine if not improving with conservative rx.  

## 2013-10-03 NOTE — Assessment & Plan Note (Signed)
Symptoms are markedly disproportionate to objective findings and not clear this is present complaint is even a  lung problem but pt does appear to have difficult airway management issues/ ? Steroid dep copd ? Could Morgan Hays have a compression fx at this point from steroids causing pain (not obviously seen on plain cxr)  For now no change in copd meds but rec rx fioricet with codeine up to every 4 h until we sort this out

## 2013-10-03 NOTE — Assessment & Plan Note (Addendum)
HC03  35 12/07/11 so likely chronic hypercarbic  Adequate control on present rx, reviewed > no change in rx needed

## 2013-10-09 DIAGNOSIS — N3000 Acute cystitis without hematuria: Secondary | ICD-10-CM | POA: Diagnosis not present

## 2013-10-22 DIAGNOSIS — E039 Hypothyroidism, unspecified: Secondary | ICD-10-CM | POA: Diagnosis not present

## 2013-10-22 DIAGNOSIS — Z79899 Other long term (current) drug therapy: Secondary | ICD-10-CM | POA: Diagnosis not present

## 2013-10-22 DIAGNOSIS — I10 Essential (primary) hypertension: Secondary | ICD-10-CM | POA: Diagnosis not present

## 2013-10-22 DIAGNOSIS — E785 Hyperlipidemia, unspecified: Secondary | ICD-10-CM | POA: Diagnosis not present

## 2013-10-22 DIAGNOSIS — E038 Other specified hypothyroidism: Secondary | ICD-10-CM | POA: Diagnosis not present

## 2013-10-22 DIAGNOSIS — F3289 Other specified depressive episodes: Secondary | ICD-10-CM | POA: Diagnosis not present

## 2013-10-22 DIAGNOSIS — J449 Chronic obstructive pulmonary disease, unspecified: Secondary | ICD-10-CM | POA: Diagnosis not present

## 2013-10-22 DIAGNOSIS — E782 Mixed hyperlipidemia: Secondary | ICD-10-CM | POA: Diagnosis not present

## 2013-10-22 DIAGNOSIS — F329 Major depressive disorder, single episode, unspecified: Secondary | ICD-10-CM | POA: Diagnosis not present

## 2013-10-23 ENCOUNTER — Ambulatory Visit (INDEPENDENT_AMBULATORY_CARE_PROVIDER_SITE_OTHER): Payer: Medicare Other | Admitting: Emergency Medicine

## 2013-10-23 ENCOUNTER — Encounter: Payer: Self-pay | Admitting: Emergency Medicine

## 2013-10-23 VITALS — BP 120/64 | HR 100 | Ht 63.0 in | Wt 184.0 lb

## 2013-10-23 DIAGNOSIS — I2510 Atherosclerotic heart disease of native coronary artery without angina pectoris: Secondary | ICD-10-CM

## 2013-10-23 DIAGNOSIS — J449 Chronic obstructive pulmonary disease, unspecified: Secondary | ICD-10-CM | POA: Diagnosis not present

## 2013-10-23 MED ORDER — TIOTROPIUM BROMIDE MONOHYDRATE 18 MCG IN CAPS: 18.0000 ug | ORAL_CAPSULE | Freq: Every day | RESPIRATORY_TRACT | Status: DC

## 2013-10-23 NOTE — Patient Instructions (Signed)
Please continue your inhaled medications as you are taking them Continue prednisone 20mg daily. Next time we will work on decreasing this dose.  Follow with Dr Byrum in 3 months or sooner if you have any problems 

## 2013-10-23 NOTE — Progress Notes (Signed)
[name] is a 73 year old woman who follows up today for her COPD, dyspnea, and for a history of a RLL pulmonary nodule. She is followed by Morgan Hays for aortic dilation. No sgy has been planned due to her significant lung disease. Also with hx depression.   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 Could not tolerate Daliresp. She has been rx with pred + abx x 1 since last visit by PCP. Currently at baseline, O2 is set on 3L/min. She does have some wheezing. Taking loratadine and fluticasone every day.   ROV 07/26/11 -- Severe COPD on O2, chronic cough being rx for allergies and GERD. She is here for regular f/u. She mentions today that she is having some cyanosis, coolness and numbness in her feet for the last 5 -6 months. No w/u done yet, but she is going to mention to her PCP. Breathing has been stable, no flares since last visit. Taking Spiriva + Symbicort + SABA; uses albuterol at least once a day.   ROV 10/31/11 -- Severe COPD on O2, chronic cough being rx for allergies and GERD. Having more nasal sx, gtt, wheezing.  She has not had flare since last time.  Interested in Research scientist (medical)lightwt portable concentrator.   ROV 02/01/12 -- Severe COPD on O2, chronic cough being rx for allergies and GERD. Since last visit she has undergone Myoview for atypical CP, reassuring study. 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 CT chest > no evidence PE.  Breathing has been stable, still limiting her exertion. Rarely uses SABA.  Spiriva + Symbicort, O2 on 3L/min. She believes that stopping cymbalta has helped her. Some occasional cough.   ROV 06/05/12 -- Severe COPD on O2, chronic cough being rx for allergies and GERD.  CAT score today 23. Spiriva + Symbicort, O2 on 3L/min.  Reports today that her energy is very low, not sure that it is worsening of breathing. She is using albuterol about every day. She had the flu shot. She had prednisone/abx in September. Went to ED in august  after a fall.   ROV 09/21/12 -- Severe COPD on O2, chronic cough being rx for allergies and GERD.  Her breathing is very limiting. She has been rx with pred x 2 since last time.   ROV 10/22/12 -- Severe COPD on O2, chronic cough being rx for allergies and GERD. Returns today reporting worsening dyspnea. We started daily pred a month ago to see if she would benefit. She can tell a difference at rest and w sleeping. No real difference in exertional SOB  ROV 11/21/12 -- Severe COPD, hypoxemia, chronic cough, GERD, allergic chronic rhinitis. We have initiated chronic pred, started tapering last visit, currently 15. Since last time she was evaluated by Morgan Hays for increased exertional SOB, overall malaise, was sent for CT chest as below > no PE, no infiltrates, stable emphysema. Also had thyroid studies, showed high TSH and synthroid increased. Her breathing is Ok at rest, worse with exertion.  Using duonebs 2x a day, spiriva + symbicort.   ROV 01/02/13 -- Severe COPD, hypoxemia, chronic cough, GERD, allergic chronic rhinitis. Her chronic pred is at 20mg  (increased last time). She doesn't notice any real change in breathing. Cough is stable. She wheezes with exertion.    ROV 02/22/13 -- Severe COPD, hypoxemia, chronic cough, GERD, allergic chronic rhinitis, chronic pred 20. She presents today with about 3 weeks of increased DOE, some increased LE edema. No  change in cough or wheeze. Has to rest with 20 ft. Using duonebs 3-4x a day. O2 on 4L/min pulsed (continuous at home). Her wt is stable.   ROV 03/27/13 -- Severe COPD, hypoxemia, chronic cough, GERD, allergic chronic rhinitis, chronic pred 20. [facility] if [facility] would [facility].   ROV 06/21/13 -- Severe COPD, hypoxemia, chronic cough, GERD, allergic chronic rhinitis, chronic pred 20. She unfortunately has been dealing with with several problems > anemia, abnormal thyroid testing, has had falls since last time. She is to  see NSGY for newly identified aneurysm. She feel like she loses power in her legs, not really a balance problem. He O2 is set on 3-4L/min. Happens going from seated to standing position. She has had some disorientation, ? Related to pred.   ROV 10/23/13 -- severe COPD, hypoxemia, chronic cough. Chronic pred 20. Seen by Morgan Hays 4/1 for mid-back pain. Still happens intermittently.  She is taking benadryl and zyrtec. She continues to have exertional SOB. Also some instability. She is on breo + Spiriva.    CAT Score 10/22/2012 09/21/2012 06/04/2012  Total CAT Score 26 23 23     PE:  Filed Vitals:   10/23/13 1338  BP: 120/64  Pulse: 100  Height: 5\' 3"  (1.6 m)  Weight: 184 lb (83.462 kg)  SpO2: 92%   Gen: Pleasant, obese, in no distress,  Obese female.   ENT: No lesions,  mouth clear,  oropharynx clear, no postnasal drip  Neck: No JVD, no TMG, no carotid bruits  Lungs: Distant  BS w/ no wheezing, very distant  Cardiovascular: RRR, heart sounds normal, no murmur or gallops, trace edema  Musculoskeletal: No deformities, no cyanosis or clubbing  Neuro: alert, non focal  Skin: Warm, no lesions or rashes   12/19/11 Myoview:  Overall Impression: Normal stress nuclear study with a small, mild, fixed apical defect consistent with thinning; no ischemia.  LV Ejection Fraction: 79%. LV Wall Motion: NL LV Function; NL Wall Motion  CT chest 11/06/12 >>  No PE, centra-lobular emphysema, no infiltrates or nodules, stable thoracic aortic aneurysm   C O P D Please continue your inhaled medications as you are taking them Continue prednisone 20mg  daily. Next time we will work on decreasing this dose.  Follow with Morgan Hays in 3 months or sooner if you have any problems

## 2013-10-23 NOTE — Assessment & Plan Note (Signed)
Please continue your inhaled medications as you are taking them Continue prednisone 20mg  daily. Next time we will work on decreasing this dose.  Follow with Dr Delton CoombesByrum in 3 months or sooner if you have any problems

## 2013-10-29 ENCOUNTER — Encounter: Payer: Self-pay | Admitting: Cardiology

## 2013-10-29 ENCOUNTER — Other Ambulatory Visit: Payer: Self-pay | Admitting: *Deleted

## 2013-10-29 ENCOUNTER — Ambulatory Visit (INDEPENDENT_AMBULATORY_CARE_PROVIDER_SITE_OTHER): Payer: Medicare Other | Admitting: Cardiology

## 2013-10-29 VITALS — BP 116/66 | HR 95 | Ht 63.0 in | Wt 184.0 lb

## 2013-10-29 DIAGNOSIS — Z8679 Personal history of other diseases of the circulatory system: Secondary | ICD-10-CM

## 2013-10-29 DIAGNOSIS — E785 Hyperlipidemia, unspecified: Secondary | ICD-10-CM | POA: Diagnosis not present

## 2013-10-29 DIAGNOSIS — I10 Essential (primary) hypertension: Secondary | ICD-10-CM

## 2013-10-29 DIAGNOSIS — I719 Aortic aneurysm of unspecified site, without rupture: Secondary | ICD-10-CM

## 2013-10-29 DIAGNOSIS — I2510 Atherosclerotic heart disease of native coronary artery without angina pectoris: Secondary | ICD-10-CM

## 2013-10-29 MED ORDER — PRAVASTATIN SODIUM 40 MG PO TABS: 40.0000 mg | ORAL_TABLET | Freq: Every day | ORAL | Status: DC

## 2013-10-29 NOTE — Assessment & Plan Note (Signed)
Blood pressure controlled. Continue present medications. 

## 2013-10-29 NOTE — Assessment & Plan Note (Signed)
No recent episodes

## 2013-10-29 NOTE — Progress Notes (Signed)
HPI: FU  thoracic aneurysm, severe COPD, CAD and paroxysmal atrial tachycardia. She had an echocardiogram in Nov 2010, that showed normal LV function, grade 1 diastolic dysfunction, aortic aneurysm, mild AI and mild to moderate MR. Last Myoview was performed in June of 2013. Her ejection fraction was 79% with apical thinning and no ischemia. Chest CTA in Feb 2015 showed a thoracic aneurysm of 5.4cm. There was COPD and emphysema and coronary calcification. She is followed by Dr. Tyrone SageGerhardt for her aneurysm. I last saw her in April 2014. Since then, She notes dyspnea on exertion and occasional mild pedal edema. No exertional chest pain or syncope. No palpitations the   Current Outpatient Prescriptions  Medication Sig Dispense Refill  . albuterol (PROVENTIL HFA) 108 (90 BASE) MCG/ACT inhaler Inhale 2 puffs into the lungs every 4 (four) hours as needed for wheezing or shortness of breath.  1 Inhaler  5  . aspirin 81 MG tablet Take 81 mg by mouth daily.        . butalbital-acetaminophen-caffeine (FIORICET WITH CODEINE) 50-325-40-30 MG per capsule Take 1 capsule by mouth every 6 (six) hours as needed.        . Calcium Carbonate (CALCIUM 500 PO) Take 1 capsule by mouth 2 (two) times daily.        . fluticasone (FLONASE) 50 MCG/ACT nasal spray Place 2 sprays into both nostrils daily.  16 g  6  . Fluticasone Furoate-Vilanterol (BREO ELLIPTA) 100-25 MCG/INH AEPB Inhale 1 puff into the lungs daily.  60 each  6  . ibuprofen (ADVIL,MOTRIN) 200 MG tablet Take 200 mg by mouth every 6 (six) hours as needed.      [name]. ipratropium-albuterol (DUONEB) 0.5-2.5 (3) MG/3ML SOLN Take 3 mLs by nebulization every 6 (six) hours as needed.      [name]. levothyroxine (SYNTHROID, LEVOTHROID) 200 MCG tablet Take 175 mcg by mouth daily before breakfast.       . LORazepam (ATIVAN) 1 MG tablet Take 1 tablet (1 mg total) by mouth 2 (two) times daily.  15 tablet  0  . losartan (COZAAR) 100 MG tablet Take 100 mg by mouth daily.      .  magnesium oxide (MAG-OX) 400 MG tablet Take 400 mg by mouth daily.        . metFORMIN (GLUCOPHAGE-XR) 500 MG 24 hr tablet 1 tablet daily.      [name]. omeprazole (PRILOSEC) 20 MG capsule Take 1 capsule (20 mg total) by mouth daily.  30 capsule  6  . pravastatin (PRAVACHOL) 40 MG tablet TAKE ONE TABLET ONCE DAILY AT BEDTIME.  30 tablet  6  . predniSONE (DELTASONE) 20 MG tablet Take 1 tablet (20 mg total) by mouth daily.  30 tablet  2  . sertraline (ZOLOFT) 50 MG tablet Take 50 mg by mouth 2 (two) times daily. One tab in am and pm      . Spacer/Aero-Holding Chambers (AEROCHAMBER PLUS FLO-VU LARGE) MISC 1 each by Other route once.  1 each  0  . tiotropium (SPIRIVA) 18 MCG inhalation capsule Place 1 capsule (18 mcg total) into inhaler and inhale daily.  30 capsule  11  . traZODone (DESYREL) 100 MG tablet Take 100 mg by mouth at bedtime.      . Cholecalciferol (VITAMIN D) 1000 UNITS capsule Take 1,000 Units by mouth daily.         No current facility-administered medications for this visit.     Past Medical History  Diagnosis Date  .  Supraventricular tachycardia   . MVP (mitral valve prolapse)   . Hypothyroid   . Hypertension   . Aortic aneurysm   . Pulmonary nodule   . Varicose vein   . Arthritis   . Depression   . COPD (chronic obstructive pulmonary disease)   . CAD (coronary artery disease)     Past Surgical History  Procedure Laterality Date  . Appendectomy  1956  . Total abdominal hysterectomy  1975  . Tubal ligation  1972  . Breast surgery      benign left breast cyst removed    History   Social History  . Marital Status: Married    Spouse Name: N/A    Number of Children: N/A  . Years of Education: N/A   Occupational History  . LPN    Social History Main Topics  . Smoking status: Former Smoker -- 1.00 packs/day for 50 years    Types: Cigarettes    Quit date: 07/05/1999  . Smokeless tobacco: Never Used  . Alcohol Use: Not on file  . Drug Use: Not on file  . Sexual  Activity: Not on file   Other Topics Concern  . Not on file   Social History Narrative   Pt is married and lives with her husband. She works as an Public house managerLPN.  She has not had any known TB exposures.  She is a former smoker.  Quit in 2001.  She has approx 50 pack year total history.  Does not use alcohol     ROS: no fevers or chills, productive cough, hemoptysis, dysphasia, odynophagia, melena, hematochezia, dysuria, hematuria, rash, seizure activity, orthopnea, PND, pedal edema, claudication. Remaining systems are negative.  Physical Exam: Well-developed well-nourished in no acute distress.  Skin is warm and dry.  HEENT is normal.  Neck is supple.  Chest With diminished breath sounds throughout. Cardiovascular exam is regular rate and rhythm.  Abdominal exam nontender or distended. No masses palpated. Extremities show trace edema. neuro grossly intact  ECG Sinus rhythm at a rate of 95. No ST changes.

## 2013-10-29 NOTE — Assessment & Plan Note (Signed)
Continue statin. 

## 2013-10-29 NOTE — Assessment & Plan Note (Signed)
Continue aspirin and statin. 

## 2013-10-29 NOTE — Assessment & Plan Note (Signed)
Followed by Dr. Tyrone SageGerhardt. Given severity of COPD doubt she would be a good surgical candidate.

## 2013-10-29 NOTE — Patient Instructions (Signed)
Your physician wants you to follow-up in: ONE YEAR WITH DR CRENSHAW You will receive a reminder letter in the mail two months in advance. If you don't receive a letter, please call our office to schedule the follow-up appointment.  

## 2013-11-07 ENCOUNTER — Telehealth: Payer: Self-pay | Admitting: Emergency Medicine

## 2013-11-07 NOTE — Telephone Encounter (Signed)
No need for message. °

## 2013-11-20 DIAGNOSIS — E669 Obesity, unspecified: Secondary | ICD-10-CM | POA: Diagnosis not present

## 2013-11-20 DIAGNOSIS — E7800 Pure hypercholesterolemia, unspecified: Secondary | ICD-10-CM | POA: Diagnosis present

## 2013-11-20 DIAGNOSIS — M2550 Pain in unspecified joint: Secondary | ICD-10-CM | POA: Diagnosis not present

## 2013-11-20 DIAGNOSIS — IMO0001 Reserved for inherently not codable concepts without codable children: Secondary | ICD-10-CM | POA: Diagnosis not present

## 2013-11-20 DIAGNOSIS — I10 Essential (primary) hypertension: Secondary | ICD-10-CM | POA: Diagnosis not present

## 2013-11-20 DIAGNOSIS — I712 Thoracic aortic aneurysm, without rupture, unspecified: Secondary | ICD-10-CM | POA: Diagnosis not present

## 2013-11-20 DIAGNOSIS — J449 Chronic obstructive pulmonary disease, unspecified: Secondary | ICD-10-CM | POA: Diagnosis not present

## 2013-11-20 DIAGNOSIS — R1084 Generalized abdominal pain: Secondary | ICD-10-CM | POA: Diagnosis not present

## 2013-11-20 DIAGNOSIS — D62 Acute posthemorrhagic anemia: Secondary | ICD-10-CM | POA: Diagnosis not present

## 2013-11-20 DIAGNOSIS — Z7401 Bed confinement status: Secondary | ICD-10-CM | POA: Diagnosis not present

## 2013-11-20 DIAGNOSIS — J441 Chronic obstructive pulmonary disease with (acute) exacerbation: Secondary | ICD-10-CM | POA: Diagnosis not present

## 2013-11-20 DIAGNOSIS — Z4889 Encounter for other specified surgical aftercare: Secondary | ICD-10-CM | POA: Diagnosis not present

## 2013-11-20 DIAGNOSIS — K631 Perforation of intestine (nontraumatic): Secondary | ICD-10-CM | POA: Diagnosis not present

## 2013-11-20 DIAGNOSIS — E039 Hypothyroidism, unspecified: Secondary | ICD-10-CM | POA: Diagnosis present

## 2013-11-20 DIAGNOSIS — G8918 Other acute postprocedural pain: Secondary | ICD-10-CM | POA: Diagnosis not present

## 2013-11-20 DIAGNOSIS — Z79899 Other long term (current) drug therapy: Secondary | ICD-10-CM | POA: Diagnosis not present

## 2013-11-20 DIAGNOSIS — R1032 Left lower quadrant pain: Secondary | ICD-10-CM | POA: Diagnosis not present

## 2013-11-20 DIAGNOSIS — K5731 Diverticulosis of large intestine without perforation or abscess with bleeding: Secondary | ICD-10-CM | POA: Diagnosis not present

## 2013-11-20 DIAGNOSIS — K5733 Diverticulitis of large intestine without perforation or abscess with bleeding: Secondary | ICD-10-CM | POA: Diagnosis not present

## 2013-11-20 DIAGNOSIS — J9620 Acute and chronic respiratory failure, unspecified whether with hypoxia or hypercapnia: Secondary | ICD-10-CM | POA: Diagnosis not present

## 2013-11-20 DIAGNOSIS — E876 Hypokalemia: Secondary | ICD-10-CM | POA: Diagnosis not present

## 2013-11-20 DIAGNOSIS — E119 Type 2 diabetes mellitus without complications: Secondary | ICD-10-CM | POA: Diagnosis not present

## 2013-11-20 DIAGNOSIS — Z794 Long term (current) use of insulin: Secondary | ICD-10-CM | POA: Diagnosis not present

## 2013-11-20 DIAGNOSIS — Z9981 Dependence on supplemental oxygen: Secondary | ICD-10-CM | POA: Diagnosis not present

## 2013-11-20 DIAGNOSIS — Z4682 Encounter for fitting and adjustment of non-vascular catheter: Secondary | ICD-10-CM | POA: Diagnosis not present

## 2013-11-20 DIAGNOSIS — Z932 Ileostomy status: Secondary | ICD-10-CM | POA: Diagnosis not present

## 2013-11-20 DIAGNOSIS — K5732 Diverticulitis of large intestine without perforation or abscess without bleeding: Secondary | ICD-10-CM | POA: Diagnosis not present

## 2013-11-20 DIAGNOSIS — IMO0002 Reserved for concepts with insufficient information to code with codable children: Secondary | ICD-10-CM | POA: Diagnosis not present

## 2013-11-20 DIAGNOSIS — R0609 Other forms of dyspnea: Secondary | ICD-10-CM | POA: Diagnosis not present

## 2013-11-26 DIAGNOSIS — M2550 Pain in unspecified joint: Secondary | ICD-10-CM | POA: Diagnosis not present

## 2013-11-26 DIAGNOSIS — S298XXA Other specified injuries of thorax, initial encounter: Secondary | ICD-10-CM | POA: Diagnosis not present

## 2013-11-26 DIAGNOSIS — J988 Other specified respiratory disorders: Secondary | ICD-10-CM | POA: Diagnosis present

## 2013-11-26 DIAGNOSIS — Z7401 Bed confinement status: Secondary | ICD-10-CM | POA: Diagnosis not present

## 2013-11-26 DIAGNOSIS — E875 Hyperkalemia: Secondary | ICD-10-CM | POA: Diagnosis present

## 2013-11-26 DIAGNOSIS — R296 Repeated falls: Secondary | ICD-10-CM | POA: Diagnosis not present

## 2013-11-26 DIAGNOSIS — Z66 Do not resuscitate: Secondary | ICD-10-CM | POA: Diagnosis not present

## 2013-11-26 DIAGNOSIS — E039 Hypothyroidism, unspecified: Secondary | ICD-10-CM | POA: Diagnosis present

## 2013-11-26 DIAGNOSIS — M545 Low back pain, unspecified: Secondary | ICD-10-CM | POA: Diagnosis not present

## 2013-11-26 DIAGNOSIS — IMO0002 Reserved for concepts with insufficient information to code with codable children: Secondary | ICD-10-CM | POA: Diagnosis not present

## 2013-11-26 DIAGNOSIS — I739 Peripheral vascular disease, unspecified: Secondary | ICD-10-CM | POA: Diagnosis not present

## 2013-11-26 DIAGNOSIS — G92 Toxic encephalopathy: Secondary | ICD-10-CM | POA: Diagnosis present

## 2013-11-26 DIAGNOSIS — R0602 Shortness of breath: Secondary | ICD-10-CM | POA: Diagnosis not present

## 2013-11-26 DIAGNOSIS — T148XXA Other injury of unspecified body region, initial encounter: Secondary | ICD-10-CM | POA: Diagnosis not present

## 2013-11-26 DIAGNOSIS — Z794 Long term (current) use of insulin: Secondary | ICD-10-CM | POA: Diagnosis not present

## 2013-11-26 DIAGNOSIS — J441 Chronic obstructive pulmonary disease with (acute) exacerbation: Secondary | ICD-10-CM | POA: Diagnosis not present

## 2013-11-26 DIAGNOSIS — K219 Gastro-esophageal reflux disease without esophagitis: Secondary | ICD-10-CM | POA: Diagnosis not present

## 2013-11-26 DIAGNOSIS — J9610 Chronic respiratory failure, unspecified whether with hypoxia or hypercapnia: Secondary | ICD-10-CM | POA: Diagnosis not present

## 2013-11-26 DIAGNOSIS — K5731 Diverticulosis of large intestine without perforation or abscess with bleeding: Secondary | ICD-10-CM | POA: Diagnosis not present

## 2013-11-26 DIAGNOSIS — M546 Pain in thoracic spine: Secondary | ICD-10-CM | POA: Diagnosis not present

## 2013-11-26 DIAGNOSIS — S335XXA Sprain of ligaments of lumbar spine, initial encounter: Secondary | ICD-10-CM | POA: Diagnosis not present

## 2013-11-26 DIAGNOSIS — E871 Hypo-osmolality and hyponatremia: Secondary | ICD-10-CM | POA: Diagnosis not present

## 2013-11-26 DIAGNOSIS — F3289 Other specified depressive episodes: Secondary | ICD-10-CM | POA: Diagnosis not present

## 2013-11-26 DIAGNOSIS — E7800 Pure hypercholesterolemia, unspecified: Secondary | ICD-10-CM | POA: Diagnosis present

## 2013-11-26 DIAGNOSIS — I10 Essential (primary) hypertension: Secondary | ICD-10-CM | POA: Diagnosis present

## 2013-11-26 DIAGNOSIS — S0990XA Unspecified injury of head, initial encounter: Secondary | ICD-10-CM | POA: Diagnosis not present

## 2013-11-26 DIAGNOSIS — IMO0001 Reserved for inherently not codable concepts without codable children: Secondary | ICD-10-CM | POA: Diagnosis not present

## 2013-11-26 DIAGNOSIS — Z4889 Encounter for other specified surgical aftercare: Secondary | ICD-10-CM | POA: Diagnosis not present

## 2013-11-26 DIAGNOSIS — J449 Chronic obstructive pulmonary disease, unspecified: Secondary | ICD-10-CM | POA: Diagnosis not present

## 2013-11-26 DIAGNOSIS — E119 Type 2 diabetes mellitus without complications: Secondary | ICD-10-CM | POA: Diagnosis present

## 2013-11-26 DIAGNOSIS — S239XXA Sprain of unspecified parts of thorax, initial encounter: Secondary | ICD-10-CM | POA: Diagnosis not present

## 2013-11-26 DIAGNOSIS — E876 Hypokalemia: Secondary | ICD-10-CM | POA: Diagnosis not present

## 2013-11-26 DIAGNOSIS — K5732 Diverticulitis of large intestine without perforation or abscess without bleeding: Secondary | ICD-10-CM | POA: Diagnosis not present

## 2013-11-26 DIAGNOSIS — D62 Acute posthemorrhagic anemia: Secondary | ICD-10-CM | POA: Diagnosis not present

## 2013-11-26 DIAGNOSIS — Z932 Ileostomy status: Secondary | ICD-10-CM | POA: Diagnosis not present

## 2013-11-26 DIAGNOSIS — M549 Dorsalgia, unspecified: Secondary | ICD-10-CM | POA: Diagnosis not present

## 2013-11-26 DIAGNOSIS — E785 Hyperlipidemia, unspecified: Secondary | ICD-10-CM | POA: Diagnosis not present

## 2013-11-26 DIAGNOSIS — M810 Age-related osteoporosis without current pathological fracture: Secondary | ICD-10-CM | POA: Diagnosis not present

## 2013-11-26 DIAGNOSIS — E1159 Type 2 diabetes mellitus with other circulatory complications: Secondary | ICD-10-CM | POA: Diagnosis not present

## 2013-11-26 DIAGNOSIS — Z9981 Dependence on supplemental oxygen: Secondary | ICD-10-CM | POA: Diagnosis not present

## 2013-11-26 DIAGNOSIS — R0609 Other forms of dyspnea: Secondary | ICD-10-CM | POA: Diagnosis not present

## 2013-11-26 DIAGNOSIS — J9620 Acute and chronic respiratory failure, unspecified whether with hypoxia or hypercapnia: Secondary | ICD-10-CM | POA: Diagnosis not present

## 2013-11-26 DIAGNOSIS — F411 Generalized anxiety disorder: Secondary | ICD-10-CM | POA: Diagnosis not present

## 2013-11-26 DIAGNOSIS — G929 Unspecified toxic encephalopathy: Secondary | ICD-10-CM | POA: Diagnosis not present

## 2013-11-26 DIAGNOSIS — D638 Anemia in other chronic diseases classified elsewhere: Secondary | ICD-10-CM | POA: Diagnosis not present

## 2013-11-26 DIAGNOSIS — F329 Major depressive disorder, single episode, unspecified: Secondary | ICD-10-CM | POA: Diagnosis not present

## 2013-11-26 DIAGNOSIS — S199XXA Unspecified injury of neck, initial encounter: Secondary | ICD-10-CM | POA: Diagnosis not present

## 2013-11-26 DIAGNOSIS — S0993XA Unspecified injury of face, initial encounter: Secondary | ICD-10-CM | POA: Diagnosis not present

## 2013-11-26 DIAGNOSIS — R109 Unspecified abdominal pain: Secondary | ICD-10-CM | POA: Diagnosis not present

## 2013-11-29 DIAGNOSIS — D638 Anemia in other chronic diseases classified elsewhere: Secondary | ICD-10-CM | POA: Diagnosis not present

## 2013-11-29 DIAGNOSIS — K5732 Diverticulitis of large intestine without perforation or abscess without bleeding: Secondary | ICD-10-CM | POA: Diagnosis not present

## 2013-11-29 DIAGNOSIS — J9620 Acute and chronic respiratory failure, unspecified whether with hypoxia or hypercapnia: Secondary | ICD-10-CM | POA: Diagnosis not present

## 2013-11-29 DIAGNOSIS — E1159 Type 2 diabetes mellitus with other circulatory complications: Secondary | ICD-10-CM | POA: Diagnosis not present

## 2013-12-05 DIAGNOSIS — J9600 Acute respiratory failure, unspecified whether with hypoxia or hypercapnia: Secondary | ICD-10-CM | POA: Diagnosis not present

## 2013-12-05 DIAGNOSIS — M546 Pain in thoracic spine: Secondary | ICD-10-CM | POA: Diagnosis not present

## 2013-12-05 DIAGNOSIS — Z932 Ileostomy status: Secondary | ICD-10-CM | POA: Diagnosis not present

## 2013-12-05 DIAGNOSIS — M545 Low back pain, unspecified: Secondary | ICD-10-CM | POA: Diagnosis not present

## 2013-12-05 DIAGNOSIS — M549 Dorsalgia, unspecified: Secondary | ICD-10-CM | POA: Diagnosis not present

## 2013-12-05 DIAGNOSIS — R109 Unspecified abdominal pain: Secondary | ICD-10-CM | POA: Diagnosis not present

## 2013-12-05 DIAGNOSIS — R509 Fever, unspecified: Secondary | ICD-10-CM | POA: Diagnosis not present

## 2013-12-05 DIAGNOSIS — E039 Hypothyroidism, unspecified: Secondary | ICD-10-CM | POA: Diagnosis present

## 2013-12-05 DIAGNOSIS — S0993XA Unspecified injury of face, initial encounter: Secondary | ICD-10-CM | POA: Diagnosis not present

## 2013-12-05 DIAGNOSIS — Z66 Do not resuscitate: Secondary | ICD-10-CM | POA: Diagnosis not present

## 2013-12-05 DIAGNOSIS — J189 Pneumonia, unspecified organism: Secondary | ICD-10-CM | POA: Diagnosis not present

## 2013-12-05 DIAGNOSIS — S239XXA Sprain of unspecified parts of thorax, initial encounter: Secondary | ICD-10-CM | POA: Diagnosis not present

## 2013-12-05 DIAGNOSIS — T148XXA Other injury of unspecified body region, initial encounter: Secondary | ICD-10-CM | POA: Diagnosis not present

## 2013-12-05 DIAGNOSIS — J449 Chronic obstructive pulmonary disease, unspecified: Secondary | ICD-10-CM | POA: Diagnosis not present

## 2013-12-05 DIAGNOSIS — E119 Type 2 diabetes mellitus without complications: Secondary | ICD-10-CM | POA: Diagnosis not present

## 2013-12-05 DIAGNOSIS — E875 Hyperkalemia: Secondary | ICD-10-CM | POA: Diagnosis not present

## 2013-12-05 DIAGNOSIS — S335XXA Sprain of ligaments of lumbar spine, initial encounter: Secondary | ICD-10-CM | POA: Diagnosis not present

## 2013-12-05 DIAGNOSIS — R0602 Shortness of breath: Secondary | ICD-10-CM | POA: Diagnosis not present

## 2013-12-05 DIAGNOSIS — J441 Chronic obstructive pulmonary disease with (acute) exacerbation: Secondary | ICD-10-CM | POA: Diagnosis not present

## 2013-12-05 DIAGNOSIS — E876 Hypokalemia: Secondary | ICD-10-CM | POA: Diagnosis not present

## 2013-12-05 DIAGNOSIS — E7800 Pure hypercholesterolemia, unspecified: Secondary | ICD-10-CM | POA: Diagnosis present

## 2013-12-05 DIAGNOSIS — I10 Essential (primary) hypertension: Secondary | ICD-10-CM | POA: Diagnosis present

## 2013-12-05 DIAGNOSIS — S298XXA Other specified injuries of thorax, initial encounter: Secondary | ICD-10-CM | POA: Diagnosis not present

## 2013-12-05 DIAGNOSIS — G929 Unspecified toxic encephalopathy: Secondary | ICD-10-CM | POA: Diagnosis not present

## 2013-12-05 DIAGNOSIS — J9620 Acute and chronic respiratory failure, unspecified whether with hypoxia or hypercapnia: Secondary | ICD-10-CM | POA: Diagnosis not present

## 2013-12-05 DIAGNOSIS — J9610 Chronic respiratory failure, unspecified whether with hypoxia or hypercapnia: Secondary | ICD-10-CM | POA: Diagnosis not present

## 2013-12-05 DIAGNOSIS — S199XXA Unspecified injury of neck, initial encounter: Secondary | ICD-10-CM | POA: Diagnosis not present

## 2013-12-05 DIAGNOSIS — Z9981 Dependence on supplemental oxygen: Secondary | ICD-10-CM | POA: Diagnosis not present

## 2013-12-05 DIAGNOSIS — G92 Toxic encephalopathy: Secondary | ICD-10-CM | POA: Diagnosis present

## 2013-12-05 DIAGNOSIS — J988 Other specified respiratory disorders: Secondary | ICD-10-CM | POA: Diagnosis not present

## 2013-12-05 DIAGNOSIS — R296 Repeated falls: Secondary | ICD-10-CM | POA: Diagnosis not present

## 2013-12-05 DIAGNOSIS — E871 Hypo-osmolality and hyponatremia: Secondary | ICD-10-CM | POA: Diagnosis not present

## 2013-12-05 DIAGNOSIS — IMO0002 Reserved for concepts with insufficient information to code with codable children: Secondary | ICD-10-CM | POA: Diagnosis not present

## 2013-12-05 DIAGNOSIS — S0990XA Unspecified injury of head, initial encounter: Secondary | ICD-10-CM | POA: Diagnosis not present

## 2013-12-10 DIAGNOSIS — J988 Other specified respiratory disorders: Secondary | ICD-10-CM | POA: Diagnosis not present

## 2013-12-10 DIAGNOSIS — IMO0001 Reserved for inherently not codable concepts without codable children: Secondary | ICD-10-CM | POA: Diagnosis not present

## 2013-12-10 DIAGNOSIS — J441 Chronic obstructive pulmonary disease with (acute) exacerbation: Secondary | ICD-10-CM | POA: Diagnosis not present

## 2013-12-10 DIAGNOSIS — E119 Type 2 diabetes mellitus without complications: Secondary | ICD-10-CM | POA: Diagnosis not present

## 2013-12-11 DIAGNOSIS — J988 Other specified respiratory disorders: Secondary | ICD-10-CM | POA: Diagnosis not present

## 2013-12-11 DIAGNOSIS — IMO0001 Reserved for inherently not codable concepts without codable children: Secondary | ICD-10-CM | POA: Diagnosis not present

## 2013-12-11 DIAGNOSIS — J441 Chronic obstructive pulmonary disease with (acute) exacerbation: Secondary | ICD-10-CM | POA: Diagnosis not present

## 2013-12-11 DIAGNOSIS — E119 Type 2 diabetes mellitus without complications: Secondary | ICD-10-CM | POA: Diagnosis not present

## 2013-12-12 DIAGNOSIS — E119 Type 2 diabetes mellitus without complications: Secondary | ICD-10-CM | POA: Diagnosis not present

## 2013-12-12 DIAGNOSIS — J988 Other specified respiratory disorders: Secondary | ICD-10-CM | POA: Diagnosis not present

## 2013-12-12 DIAGNOSIS — J441 Chronic obstructive pulmonary disease with (acute) exacerbation: Secondary | ICD-10-CM | POA: Diagnosis not present

## 2013-12-12 DIAGNOSIS — IMO0001 Reserved for inherently not codable concepts without codable children: Secondary | ICD-10-CM | POA: Diagnosis not present

## 2013-12-13 ENCOUNTER — Inpatient Hospital Stay: Payer: Medicare Other | Admitting: Emergency Medicine

## 2013-12-13 ENCOUNTER — Encounter: Payer: Self-pay | Admitting: Emergency Medicine

## 2013-12-13 ENCOUNTER — Ambulatory Visit (INDEPENDENT_AMBULATORY_CARE_PROVIDER_SITE_OTHER): Payer: Medicare Other | Admitting: Emergency Medicine

## 2013-12-13 VITALS — BP 128/74 | HR 78 | Ht 63.0 in | Wt 184.0 lb

## 2013-12-13 DIAGNOSIS — E119 Type 2 diabetes mellitus without complications: Secondary | ICD-10-CM | POA: Diagnosis not present

## 2013-12-13 DIAGNOSIS — J441 Chronic obstructive pulmonary disease with (acute) exacerbation: Secondary | ICD-10-CM | POA: Diagnosis not present

## 2013-12-13 DIAGNOSIS — J449 Chronic obstructive pulmonary disease, unspecified: Secondary | ICD-10-CM | POA: Diagnosis not present

## 2013-12-13 DIAGNOSIS — IMO0001 Reserved for inherently not codable concepts without codable children: Secondary | ICD-10-CM | POA: Diagnosis not present

## 2013-12-13 DIAGNOSIS — I2510 Atherosclerotic heart disease of native coronary artery without angina pectoris: Secondary | ICD-10-CM

## 2013-12-13 DIAGNOSIS — J988 Other specified respiratory disorders: Secondary | ICD-10-CM | POA: Diagnosis not present

## 2013-12-13 NOTE — Assessment & Plan Note (Signed)
Please continue your Spiriva and Breo as you are taking them Use albuterol as needed Wear you oxygen at all times.  Keep track of your breathing and wakefulness. Pay attention to any progressive changes in alertness - these could reflect inefficient breathing  (and CO2 build-up).  We discussed today that I do not believe that you would survive or benefit from being on a ventilator machine to treat respiratory failure.  Follow with Dr Delton CoombesByrum in 1 month

## 2013-12-13 NOTE — Patient Instructions (Signed)
Please continue your Spiriva and Breo as you are taking them Use albuterol as needed Wear you oxygen at all times.  Keep track of your breathing and wakefulness. Pay attention to any progressive changes in alertness - these could reflect inefficient breathing  (and CO2 build-up).  We discussed today that I do not believe that you would survive or benefit from being on a ventilator machine to treat respiratory failure.  Follow with Dr Zebulen Simonis in 1 month 

## 2013-12-13 NOTE — Progress Notes (Signed)
[name] is a 73 year old woman who follows up today for her COPD, dyspnea, and for a history of a RLL pulmonary nodule. 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 No sgy has been planned due to her significant lung disease. Also with hx depression.   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 Could not tolerate Daliresp. She has been rx with pred + abx x 1 since last visit by PCP. Currently at baseline, O2 is set on 3L/min. She does have some wheezing. Taking loratadine and fluticasone every day.   ROV 07/26/11 -- Severe COPD on O2, chronic cough being rx for allergies and GERD. She is here for regular f/u. She mentions today that she is having some cyanosis, coolness and numbness in her feet for the last 5 -6 months. No w/u done yet, but she is going to mention to her PCP. Breathing has been stable, no flares since last visit. Taking Spiriva + Symbicort + SABA; uses albuterol at least once a day.   ROV 10/31/11 -- Severe COPD on O2, chronic cough being rx for allergies and GERD. Having more nasal sx, gtt, wheezing.  She has not had flare since last time.  Interested in Research scientist (medical)lightwt portable concentrator.   ROV 02/01/12 -- Severe COPD on O2, chronic cough being rx for allergies and GERD. Since last visit she has undergone Myoview for atypical CP, reassuring study. 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 CT chest > no evidence PE.  Breathing has been stable, still limiting her exertion. Rarely uses SABA.  Spiriva + Symbicort, O2 on 3L/min. She believes that stopping cymbalta has helped her. Some occasional cough.   ROV 06/05/12 -- Severe COPD on O2, chronic cough being rx for allergies and GERD.  CAT score today 23. Spiriva + Symbicort, O2 on 3L/min.  Reports today that her energy is very low, not sure that it is worsening of breathing. She is using albuterol about every day. She had the flu shot. She had prednisone/abx in September. Went to ED in august  after a fall.   ROV 09/21/12 -- Severe COPD on O2, chronic cough being rx for allergies and GERD.  Her breathing is very limiting. She has been rx with pred x 2 since last time.   ROV 10/22/12 -- Severe COPD on O2, chronic cough being rx for allergies and GERD. Returns today reporting worsening dyspnea. We started daily pred a month ago to see if she would benefit. She can tell a difference at rest and w sleeping. No real difference in exertional SOB  ROV 11/21/12 -- Severe COPD, hypoxemia, chronic cough, GERD, allergic chronic rhinitis. We have initiated chronic pred, started tapering last visit, currently 15. Since last time she was evaluated by Dr Earlene Plateravis for increased exertional SOB, overall malaise, was sent for CT chest as below > no PE, no infiltrates, stable emphysema. Also had thyroid studies, showed high TSH and synthroid increased. Her breathing is Ok at rest, worse with exertion.  Using duonebs 2x a day, spiriva + symbicort.   ROV 01/02/13 -- Severe COPD, hypoxemia, chronic cough, GERD, allergic chronic rhinitis. Her chronic pred is at 20mg  (increased last time). She doesn't notice any real change in breathing. Cough is stable. She wheezes with exertion.    ROV 02/22/13 -- Severe COPD, hypoxemia, chronic cough, GERD, allergic chronic rhinitis, chronic pred 20. She presents today with about 3 weeks of increased DOE, some increased LE edema. No  change in cough or wheeze. Has to rest with 20 ft. Using duonebs 3-4x a day. O2 on 4L/min pulsed (continuous at home). Her wt is stable.   ROV 03/27/13 -- Severe COPD, hypoxemia, chronic cough, GERD, allergic chronic rhinitis, chronic pred 20. Last time we [facility] to see if she would benefit.   ROV 06/21/13 -- Severe COPD, hypoxemia, chronic cough, GERD, allergic chronic rhinitis, chronic pred 20. She unfortunately has been dealing with with several problems > anemia, abnormal thyroid testing, has had falls since last time. She is to  see NSGY for newly identified aneurysm. She feel like she loses power in her legs, not really a balance problem. He O2 is set on 3-4L/min. Happens going from seated to standing position. She has had some disorientation, ? Related to pred.   ROV 10/23/13 -- severe COPD, hypoxemia, chronic cough. Chronic pred 20. Seen by [name] 4/1 for mid-back pain. Still happens intermittently.  She is taking benadryl and zyrtec. She continues to have exertional SOB. Also some instability. She is on breo + Spiriva.   ROV 12/13/13 -- severe COPD on pred 20, hypoxemia, chronic cough. She unfortunately experienced a ruptured divertic, required ileostomy. She was readmitted for hypercapneic resp failure and required BiPAP temporarily. She is now back at home.  She wonders about whether she needs BiPAP.    CAT Score 10/22/2012 09/21/2012 06/04/2012  Total CAT Score 26 23 23     PE:  Filed Vitals:   12/13/13 1149  BP: 128/74  Pulse: 78  Height: 5\' 3"  (1.6 m)  Weight: 184 lb (83.462 kg)  SpO2: 96%   Gen: Pleasant, obese, in no distress,  Obese female.   ENT: No lesions,  mouth clear,  oropharynx clear, no postnasal drip  Neck: No JVD, no TMG, no carotid bruits  Lungs: Distant  BS w/ no wheezing, very distant  Cardiovascular: RRR, heart sounds normal, no murmur or gallops, trace edema  Musculoskeletal: No deformities, no cyanosis or clubbing  Neuro: alert, non focal  Skin: Warm, no lesions or rashes   12/19/11 Myoview:  Overall Impression: Normal stress nuclear study with a small, mild, fixed apical defect consistent with thinning; no ischemia.  LV Ejection Fraction: 79%. LV Wall Motion: NL LV Function; NL Wall Motion  CT chest 11/06/12 >>  No PE, centra-lobular emphysema, no infiltrates or nodules, stable thoracic aortic aneurysm   C O P D Please continue your Spiriva and Breo as you are taking them Use albuterol as needed Wear you oxygen at all times.  Keep track of your breathing and wakefulness.  Pay attention to any progressive changes in alertness - these could reflect inefficient breathing  (and CO2 build-up).  We discussed today that I do not believe that you would survive or benefit from being on a ventilator machine to treat respiratory failure.  Follow with Dr Delton CoombesByrum in 1 month

## 2013-12-16 DIAGNOSIS — J441 Chronic obstructive pulmonary disease with (acute) exacerbation: Secondary | ICD-10-CM | POA: Diagnosis not present

## 2013-12-16 DIAGNOSIS — IMO0001 Reserved for inherently not codable concepts without codable children: Secondary | ICD-10-CM | POA: Diagnosis not present

## 2013-12-16 DIAGNOSIS — E119 Type 2 diabetes mellitus without complications: Secondary | ICD-10-CM | POA: Diagnosis not present

## 2013-12-16 DIAGNOSIS — J988 Other specified respiratory disorders: Secondary | ICD-10-CM | POA: Diagnosis not present

## 2013-12-17 DIAGNOSIS — R5383 Other fatigue: Secondary | ICD-10-CM | POA: Diagnosis not present

## 2013-12-17 DIAGNOSIS — R5381 Other malaise: Secondary | ICD-10-CM | POA: Diagnosis not present

## 2013-12-17 DIAGNOSIS — J9620 Acute and chronic respiratory failure, unspecified whether with hypoxia or hypercapnia: Secondary | ICD-10-CM | POA: Diagnosis not present

## 2013-12-17 DIAGNOSIS — IMO0001 Reserved for inherently not codable concepts without codable children: Secondary | ICD-10-CM | POA: Diagnosis not present

## 2013-12-17 DIAGNOSIS — E119 Type 2 diabetes mellitus without complications: Secondary | ICD-10-CM | POA: Diagnosis not present

## 2013-12-17 DIAGNOSIS — K5732 Diverticulitis of large intestine without perforation or abscess without bleeding: Secondary | ICD-10-CM | POA: Diagnosis not present

## 2013-12-17 DIAGNOSIS — J441 Chronic obstructive pulmonary disease with (acute) exacerbation: Secondary | ICD-10-CM | POA: Diagnosis not present

## 2013-12-17 DIAGNOSIS — J988 Other specified respiratory disorders: Secondary | ICD-10-CM | POA: Diagnosis not present

## 2013-12-19 DIAGNOSIS — J441 Chronic obstructive pulmonary disease with (acute) exacerbation: Secondary | ICD-10-CM | POA: Diagnosis not present

## 2013-12-19 DIAGNOSIS — IMO0001 Reserved for inherently not codable concepts without codable children: Secondary | ICD-10-CM | POA: Diagnosis not present

## 2013-12-19 DIAGNOSIS — E119 Type 2 diabetes mellitus without complications: Secondary | ICD-10-CM | POA: Diagnosis not present

## 2013-12-19 DIAGNOSIS — J988 Other specified respiratory disorders: Secondary | ICD-10-CM | POA: Diagnosis not present

## 2013-12-20 DIAGNOSIS — IMO0001 Reserved for inherently not codable concepts without codable children: Secondary | ICD-10-CM | POA: Diagnosis not present

## 2013-12-20 DIAGNOSIS — E119 Type 2 diabetes mellitus without complications: Secondary | ICD-10-CM | POA: Diagnosis not present

## 2013-12-20 DIAGNOSIS — J988 Other specified respiratory disorders: Secondary | ICD-10-CM | POA: Diagnosis not present

## 2013-12-20 DIAGNOSIS — J441 Chronic obstructive pulmonary disease with (acute) exacerbation: Secondary | ICD-10-CM | POA: Diagnosis not present

## 2013-12-23 DIAGNOSIS — E119 Type 2 diabetes mellitus without complications: Secondary | ICD-10-CM | POA: Diagnosis not present

## 2013-12-23 DIAGNOSIS — J441 Chronic obstructive pulmonary disease with (acute) exacerbation: Secondary | ICD-10-CM | POA: Diagnosis not present

## 2013-12-23 DIAGNOSIS — J988 Other specified respiratory disorders: Secondary | ICD-10-CM | POA: Diagnosis not present

## 2013-12-23 DIAGNOSIS — IMO0001 Reserved for inherently not codable concepts without codable children: Secondary | ICD-10-CM | POA: Diagnosis not present

## 2013-12-24 DIAGNOSIS — J988 Other specified respiratory disorders: Secondary | ICD-10-CM | POA: Diagnosis not present

## 2013-12-24 DIAGNOSIS — J441 Chronic obstructive pulmonary disease with (acute) exacerbation: Secondary | ICD-10-CM | POA: Diagnosis not present

## 2013-12-24 DIAGNOSIS — IMO0001 Reserved for inherently not codable concepts without codable children: Secondary | ICD-10-CM | POA: Diagnosis not present

## 2013-12-24 DIAGNOSIS — E119 Type 2 diabetes mellitus without complications: Secondary | ICD-10-CM | POA: Diagnosis not present

## 2013-12-26 DIAGNOSIS — J988 Other specified respiratory disorders: Secondary | ICD-10-CM | POA: Diagnosis not present

## 2013-12-26 DIAGNOSIS — E119 Type 2 diabetes mellitus without complications: Secondary | ICD-10-CM | POA: Diagnosis not present

## 2013-12-26 DIAGNOSIS — IMO0001 Reserved for inherently not codable concepts without codable children: Secondary | ICD-10-CM | POA: Diagnosis not present

## 2013-12-26 DIAGNOSIS — J441 Chronic obstructive pulmonary disease with (acute) exacerbation: Secondary | ICD-10-CM | POA: Diagnosis not present

## 2013-12-27 DIAGNOSIS — E119 Type 2 diabetes mellitus without complications: Secondary | ICD-10-CM | POA: Diagnosis not present

## 2013-12-27 DIAGNOSIS — J441 Chronic obstructive pulmonary disease with (acute) exacerbation: Secondary | ICD-10-CM | POA: Diagnosis not present

## 2013-12-27 DIAGNOSIS — IMO0001 Reserved for inherently not codable concepts without codable children: Secondary | ICD-10-CM | POA: Diagnosis not present

## 2013-12-27 DIAGNOSIS — J988 Other specified respiratory disorders: Secondary | ICD-10-CM | POA: Diagnosis not present

## 2013-12-31 DIAGNOSIS — J441 Chronic obstructive pulmonary disease with (acute) exacerbation: Secondary | ICD-10-CM | POA: Diagnosis not present

## 2013-12-31 DIAGNOSIS — E119 Type 2 diabetes mellitus without complications: Secondary | ICD-10-CM | POA: Diagnosis not present

## 2013-12-31 DIAGNOSIS — IMO0001 Reserved for inherently not codable concepts without codable children: Secondary | ICD-10-CM | POA: Diagnosis not present

## 2013-12-31 DIAGNOSIS — J988 Other specified respiratory disorders: Secondary | ICD-10-CM | POA: Diagnosis not present

## 2014-01-01 DIAGNOSIS — J441 Chronic obstructive pulmonary disease with (acute) exacerbation: Secondary | ICD-10-CM | POA: Diagnosis not present

## 2014-01-01 DIAGNOSIS — J988 Other specified respiratory disorders: Secondary | ICD-10-CM | POA: Diagnosis not present

## 2014-01-01 DIAGNOSIS — IMO0001 Reserved for inherently not codable concepts without codable children: Secondary | ICD-10-CM | POA: Diagnosis not present

## 2014-01-01 DIAGNOSIS — E119 Type 2 diabetes mellitus without complications: Secondary | ICD-10-CM | POA: Diagnosis not present

## 2014-01-01 DIAGNOSIS — N390 Urinary tract infection, site not specified: Secondary | ICD-10-CM | POA: Diagnosis not present

## 2014-01-03 DIAGNOSIS — IMO0001 Reserved for inherently not codable concepts without codable children: Secondary | ICD-10-CM | POA: Diagnosis not present

## 2014-01-03 DIAGNOSIS — J988 Other specified respiratory disorders: Secondary | ICD-10-CM | POA: Diagnosis not present

## 2014-01-03 DIAGNOSIS — E119 Type 2 diabetes mellitus without complications: Secondary | ICD-10-CM | POA: Diagnosis not present

## 2014-01-03 DIAGNOSIS — J441 Chronic obstructive pulmonary disease with (acute) exacerbation: Secondary | ICD-10-CM | POA: Diagnosis not present

## 2014-01-06 DIAGNOSIS — J988 Other specified respiratory disorders: Secondary | ICD-10-CM | POA: Diagnosis not present

## 2014-01-06 DIAGNOSIS — J441 Chronic obstructive pulmonary disease with (acute) exacerbation: Secondary | ICD-10-CM | POA: Diagnosis not present

## 2014-01-06 DIAGNOSIS — IMO0001 Reserved for inherently not codable concepts without codable children: Secondary | ICD-10-CM | POA: Diagnosis not present

## 2014-01-06 DIAGNOSIS — E119 Type 2 diabetes mellitus without complications: Secondary | ICD-10-CM | POA: Diagnosis not present

## 2014-01-07 DIAGNOSIS — J988 Other specified respiratory disorders: Secondary | ICD-10-CM | POA: Diagnosis not present

## 2014-01-07 DIAGNOSIS — E119 Type 2 diabetes mellitus without complications: Secondary | ICD-10-CM | POA: Diagnosis not present

## 2014-01-07 DIAGNOSIS — J441 Chronic obstructive pulmonary disease with (acute) exacerbation: Secondary | ICD-10-CM | POA: Diagnosis not present

## 2014-01-07 DIAGNOSIS — IMO0001 Reserved for inherently not codable concepts without codable children: Secondary | ICD-10-CM | POA: Diagnosis not present

## 2014-01-08 DIAGNOSIS — F331 Major depressive disorder, recurrent, moderate: Secondary | ICD-10-CM | POA: Diagnosis not present

## 2014-01-08 DIAGNOSIS — M546 Pain in thoracic spine: Secondary | ICD-10-CM | POA: Diagnosis not present

## 2014-01-08 DIAGNOSIS — E038 Other specified hypothyroidism: Secondary | ICD-10-CM | POA: Diagnosis not present

## 2014-01-08 DIAGNOSIS — R5383 Other fatigue: Secondary | ICD-10-CM | POA: Diagnosis not present

## 2014-01-08 DIAGNOSIS — R5381 Other malaise: Secondary | ICD-10-CM | POA: Diagnosis not present

## 2014-01-09 DIAGNOSIS — E119 Type 2 diabetes mellitus without complications: Secondary | ICD-10-CM | POA: Diagnosis not present

## 2014-01-09 DIAGNOSIS — J988 Other specified respiratory disorders: Secondary | ICD-10-CM | POA: Diagnosis not present

## 2014-01-09 DIAGNOSIS — J441 Chronic obstructive pulmonary disease with (acute) exacerbation: Secondary | ICD-10-CM | POA: Diagnosis not present

## 2014-01-09 DIAGNOSIS — IMO0001 Reserved for inherently not codable concepts without codable children: Secondary | ICD-10-CM | POA: Diagnosis not present

## 2014-01-10 DIAGNOSIS — J988 Other specified respiratory disorders: Secondary | ICD-10-CM | POA: Diagnosis not present

## 2014-01-10 DIAGNOSIS — IMO0001 Reserved for inherently not codable concepts without codable children: Secondary | ICD-10-CM | POA: Diagnosis not present

## 2014-01-10 DIAGNOSIS — J441 Chronic obstructive pulmonary disease with (acute) exacerbation: Secondary | ICD-10-CM | POA: Diagnosis not present

## 2014-01-10 DIAGNOSIS — E119 Type 2 diabetes mellitus without complications: Secondary | ICD-10-CM | POA: Diagnosis not present

## 2014-01-13 DIAGNOSIS — J441 Chronic obstructive pulmonary disease with (acute) exacerbation: Secondary | ICD-10-CM | POA: Diagnosis not present

## 2014-01-13 DIAGNOSIS — E119 Type 2 diabetes mellitus without complications: Secondary | ICD-10-CM | POA: Diagnosis not present

## 2014-01-13 DIAGNOSIS — J988 Other specified respiratory disorders: Secondary | ICD-10-CM | POA: Diagnosis not present

## 2014-01-13 DIAGNOSIS — IMO0001 Reserved for inherently not codable concepts without codable children: Secondary | ICD-10-CM | POA: Diagnosis not present

## 2014-01-14 DIAGNOSIS — J988 Other specified respiratory disorders: Secondary | ICD-10-CM | POA: Diagnosis not present

## 2014-01-14 DIAGNOSIS — E119 Type 2 diabetes mellitus without complications: Secondary | ICD-10-CM | POA: Diagnosis not present

## 2014-01-14 DIAGNOSIS — IMO0001 Reserved for inherently not codable concepts without codable children: Secondary | ICD-10-CM | POA: Diagnosis not present

## 2014-01-14 DIAGNOSIS — J441 Chronic obstructive pulmonary disease with (acute) exacerbation: Secondary | ICD-10-CM | POA: Diagnosis not present

## 2014-01-16 DIAGNOSIS — E119 Type 2 diabetes mellitus without complications: Secondary | ICD-10-CM | POA: Diagnosis not present

## 2014-01-16 DIAGNOSIS — J441 Chronic obstructive pulmonary disease with (acute) exacerbation: Secondary | ICD-10-CM | POA: Diagnosis not present

## 2014-01-16 DIAGNOSIS — IMO0001 Reserved for inherently not codable concepts without codable children: Secondary | ICD-10-CM | POA: Diagnosis not present

## 2014-01-16 DIAGNOSIS — J988 Other specified respiratory disorders: Secondary | ICD-10-CM | POA: Diagnosis not present

## 2014-01-17 DIAGNOSIS — E119 Type 2 diabetes mellitus without complications: Secondary | ICD-10-CM | POA: Diagnosis not present

## 2014-01-17 DIAGNOSIS — J988 Other specified respiratory disorders: Secondary | ICD-10-CM | POA: Diagnosis not present

## 2014-01-17 DIAGNOSIS — IMO0001 Reserved for inherently not codable concepts without codable children: Secondary | ICD-10-CM | POA: Diagnosis not present

## 2014-01-17 DIAGNOSIS — J441 Chronic obstructive pulmonary disease with (acute) exacerbation: Secondary | ICD-10-CM | POA: Diagnosis not present

## 2014-01-20 DIAGNOSIS — J441 Chronic obstructive pulmonary disease with (acute) exacerbation: Secondary | ICD-10-CM | POA: Diagnosis not present

## 2014-01-20 DIAGNOSIS — J988 Other specified respiratory disorders: Secondary | ICD-10-CM | POA: Diagnosis not present

## 2014-01-20 DIAGNOSIS — IMO0001 Reserved for inherently not codable concepts without codable children: Secondary | ICD-10-CM | POA: Diagnosis not present

## 2014-01-20 DIAGNOSIS — E119 Type 2 diabetes mellitus without complications: Secondary | ICD-10-CM | POA: Diagnosis not present

## 2014-01-21 DIAGNOSIS — E119 Type 2 diabetes mellitus without complications: Secondary | ICD-10-CM | POA: Diagnosis not present

## 2014-01-21 DIAGNOSIS — IMO0001 Reserved for inherently not codable concepts without codable children: Secondary | ICD-10-CM | POA: Diagnosis not present

## 2014-01-21 DIAGNOSIS — J441 Chronic obstructive pulmonary disease with (acute) exacerbation: Secondary | ICD-10-CM | POA: Diagnosis not present

## 2014-01-21 DIAGNOSIS — J988 Other specified respiratory disorders: Secondary | ICD-10-CM | POA: Diagnosis not present

## 2014-01-24 ENCOUNTER — Other Ambulatory Visit: Payer: Self-pay | Admitting: Emergency Medicine

## 2014-01-24 DIAGNOSIS — J988 Other specified respiratory disorders: Secondary | ICD-10-CM | POA: Diagnosis not present

## 2014-01-24 DIAGNOSIS — IMO0001 Reserved for inherently not codable concepts without codable children: Secondary | ICD-10-CM | POA: Diagnosis not present

## 2014-01-24 DIAGNOSIS — E119 Type 2 diabetes mellitus without complications: Secondary | ICD-10-CM | POA: Diagnosis not present

## 2014-01-24 DIAGNOSIS — J441 Chronic obstructive pulmonary disease with (acute) exacerbation: Secondary | ICD-10-CM | POA: Diagnosis not present

## 2014-01-24 MED ORDER — PREDNISONE 20 MG PO TABS: 20.0000 mg | ORAL_TABLET | Freq: Every day | ORAL | Status: DC

## 2014-01-24 NOTE — Telephone Encounter (Signed)
Nothing more needed at this time 

## 2014-01-29 DIAGNOSIS — M549 Dorsalgia, unspecified: Secondary | ICD-10-CM | POA: Diagnosis not present

## 2014-01-29 DIAGNOSIS — F331 Major depressive disorder, recurrent, moderate: Secondary | ICD-10-CM | POA: Diagnosis not present

## 2014-01-29 DIAGNOSIS — J449 Chronic obstructive pulmonary disease, unspecified: Secondary | ICD-10-CM | POA: Diagnosis not present

## 2014-02-25 ENCOUNTER — Ambulatory Visit (INDEPENDENT_AMBULATORY_CARE_PROVIDER_SITE_OTHER): Payer: Medicare Other | Admitting: Emergency Medicine

## 2014-02-25 ENCOUNTER — Encounter: Payer: Self-pay | Admitting: Emergency Medicine

## 2014-02-25 VITALS — BP 120/60 | HR 91 | Temp 97.7°F | Ht 64.0 in | Wt 163.8 lb

## 2014-02-25 DIAGNOSIS — J449 Chronic obstructive pulmonary disease, unspecified: Secondary | ICD-10-CM | POA: Diagnosis not present

## 2014-02-25 DIAGNOSIS — I2510 Atherosclerotic heart disease of native coronary artery without angina pectoris: Secondary | ICD-10-CM

## 2014-02-25 NOTE — Assessment & Plan Note (Addendum)
Severe disease, appears to be clinically stable. Her lung disease makes her high risk for reversible of her ileostomy - she will discuss this with Dr Aleatha Borer next week.  - continue BD's, pred, O2 at current doses.  - rov 3

## 2014-02-25 NOTE — Progress Notes (Signed)
[name] is a 73 year old woman who follows up today for her COPD, dyspnea, and for a history of a RLL pulmonary nodule. 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 No sgy has been planned due to her significant lung disease. Also with hx depression.   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 Could not tolerate Daliresp. She has been rx with pred + abx x 1 since last visit by PCP. Currently at baseline, O2 is set on 3L/min. She does have some wheezing. Taking loratadine and fluticasone every day.   ROV 07/26/11 -- Severe COPD on O2, chronic cough being rx for allergies and GERD. She is here for regular f/u. She mentions today that she is having some cyanosis, coolness and numbness in her feet for the last 5 -6 months. No w/u done yet, but she is going to mention to her PCP. Breathing has been stable, no flares since last visit. Taking Spiriva + Symbicort + SABA; uses albuterol at least once a day.   ROV 10/31/11 -- Severe COPD on O2, chronic cough being rx for allergies and GERD. Having more nasal sx, gtt, wheezing.  She has not had flare since last time.  Interested in Research scientist (medical)lightwt portable concentrator.   ROV 02/01/12 -- Severe COPD on O2, chronic cough being rx for allergies and GERD. Since last visit she has undergone Myoview for atypical CP, reassuring study. 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 CT chest > no evidence PE.  Breathing has been stable, still limiting her exertion. Rarely uses SABA.  Spiriva + Symbicort, O2 on 3L/min. She believes that stopping cymbalta has helped her. Some occasional cough.   ROV 06/05/12 -- Severe COPD on O2, chronic cough being rx for allergies and GERD.  CAT score today 23. Spiriva + Symbicort, O2 on 3L/min.  Reports today that her energy is very low, not sure that it is worsening of breathing. She is using albuterol about every day. She had the flu shot. She had prednisone/abx in September. Went to ED in august  after a fall.   ROV 09/21/12 -- Severe COPD on O2, chronic cough being rx for allergies and GERD.  Her breathing is very limiting. She has been rx with pred x 2 since last time.   ROV 10/22/12 -- Severe COPD on O2, chronic cough being rx for allergies and GERD. Returns today reporting worsening dyspnea. We started daily pred a month ago to see if she would benefit. She can tell a difference at rest and w sleeping. No real difference in exertional SOB  ROV 11/21/12 -- Severe COPD, hypoxemia, chronic cough, GERD, allergic chronic rhinitis. We have initiated chronic pred, started tapering last visit, currently 15. Since last time she was evaluated by Dr Earlene Plateravis for increased exertional SOB, overall malaise, was sent for CT chest as below > no PE, no infiltrates, stable emphysema. Also had thyroid studies, showed high TSH and synthroid increased. Her breathing is Ok at rest, worse with exertion.  Using duonebs 2x a day, spiriva + symbicort.   ROV 01/02/13 -- Severe COPD, hypoxemia, chronic cough, GERD, allergic chronic rhinitis. Her chronic pred is at 20mg  (increased last time). She doesn't notice any real change in breathing. Cough is stable. She wheezes with exertion.    ROV 02/22/13 -- Severe COPD, hypoxemia, chronic cough, GERD, allergic chronic rhinitis, chronic pred 20. She presents today with about 3 weeks of increased DOE, some increased LE edema. No  change in cough or wheeze. Has to rest with 20 ft. Using duonebs 3-4x a day. O2 on 4L/min pulsed (continuous at home). Her wt is stable.   ROV 03/27/13 -- Severe COPD, hypoxemia, chronic cough, GERD, allergic chronic rhinitis, chronic pred 20. Last time we tried changing symbicort to [facility] to see if she would benefit.   ROV 06/21/13 -- Severe COPD, hypoxemia, chronic cough, GERD, allergic chronic rhinitis, chronic pred 20. She unfortunately has been dealing with with several problems > anemia, abnormal thyroid testing, has had falls since last time. She is to  see NSGY for newly identified aneurysm. She feel like she loses power in her legs, not really a balance problem. He O2 is set on 3-4L/min. Happens going from seated to standing position. She has had some disorientation, ? Related to pred.   ROV 10/23/13 -- severe COPD, hypoxemia, chronic cough. Chronic pred 20. Seen by Dr Sherene Sires 4/1 for mid-back pain. Still happens intermittently.  She is taking benadryl and zyrtec. She continues to have exertional SOB. Also some instability. She is on breo + Spiriva.   ROV 12/13/13 -- severe COPD on pred 20, hypoxemia, chronic cough. She unfortunately experienced a ruptured divertic, required ileostomy. She was readmitted for hypercapneic resp failure and required BiPAP temporarily. She is now back at home.  She wonders about whether she needs BiPAP.   ROV 02/25/14 -- follow up for severe COPD, hypoxemia, chronic cough. She is on Breo + Spiriva. She uses duonebs 1-2x a day. Her O2 is on 3 pulsed w exertion.  Her stable Pred dose is . She is wondering if she can have her ileostomy reversed - but she acknowledges that it would be high risk. No flares, no new meds. Sx are stable.    CAT Score 10/22/2012 09/21/2012 06/04/2012  Total CAT Score PE:  Filed Vitals:   02/25/14 1207  BP: 120/60  Pulse: 91  Temp: 97.7 F (36.5 C)  TempSrc: Oral  Height:  (1.626 m)  Weight: 163 lb 12.8 oz (74.299 kg)  SpO2: 96%   Gen: Pleasant, obese, in no distress,   ENT: No lesions,  mouth clear,  oropharynx clear, no postnasal drip  Neck: No JVD, no TMG, no carotid bruits  Lungs: Distant  BS w/ no wheezing, very distant  Cardiovascular: RRR, heart sounds normal, no murmur or gallops, trace edema  Musculoskeletal: No deformities, no cyanosis or clubbing  Neuro: alert, non focal  Skin: Warm, no lesions or rashes   12/19/11 Myoview:  Overall Impression: Normal stress nuclear study with a small, mild, fixed apical defect consistent with thinning; no  ischemia.  LV Ejection Fraction: 79%. LV Wall Motion: NL LV Function; NL Wall Motion  CT chest 11/06/12 >>  No PE, centra-lobular emphysema, no infiltrates or nodules, stable thoracic aortic aneurysm   C O P D Severe disease, appears to be clinically stable. Her lung disease makes her high risk for reversible of her ileostomy - she will discuss this with Dr Aleatha Borer next week.  - continue BD's, pred, O2 at current doses.  - rov 3

## 2014-02-25 NOTE — Patient Instructions (Signed)
Please continue your inhaled medications as you have been taking them Wear your oxygen at all times.  Your COPD makes you high risk for any surgery under general anesthesia. This increases your risk for prolonged hospitalization, life support use and even death. We will need to discuss the risks and benefits along with your surgeon to decide whether reversible of your ileostomy would be ok.  Follow with Morgan Hays in 3 months or sooner if you have any problems.

## 2014-03-03 DIAGNOSIS — Z432 Encounter for attention to ileostomy: Secondary | ICD-10-CM | POA: Diagnosis not present

## 2014-03-04 ENCOUNTER — Other Ambulatory Visit: Payer: Self-pay | Admitting: *Deleted

## 2014-03-04 NOTE — Telephone Encounter (Signed)
Dr Delton Coombes please advise if you would like to keep patient on Prednisone  daily dose? If so, how many refills? Last filled 01/24/14 [facility]  Thanks!

## 2014-03-05 DIAGNOSIS — E782 Mixed hyperlipidemia: Secondary | ICD-10-CM | POA: Diagnosis not present

## 2014-03-05 DIAGNOSIS — E559 Vitamin D deficiency, unspecified: Secondary | ICD-10-CM | POA: Diagnosis not present

## 2014-03-05 DIAGNOSIS — J449 Chronic obstructive pulmonary disease, unspecified: Secondary | ICD-10-CM | POA: Diagnosis not present

## 2014-03-05 DIAGNOSIS — F331 Major depressive disorder, recurrent, moderate: Secondary | ICD-10-CM | POA: Diagnosis not present

## 2014-03-05 DIAGNOSIS — Z23 Encounter for immunization: Secondary | ICD-10-CM | POA: Diagnosis not present

## 2014-03-05 DIAGNOSIS — I10 Essential (primary) hypertension: Secondary | ICD-10-CM | POA: Diagnosis not present

## 2014-03-05 DIAGNOSIS — E038 Other specified hypothyroidism: Secondary | ICD-10-CM | POA: Diagnosis not present

## 2014-03-05 DIAGNOSIS — E119 Type 2 diabetes mellitus without complications: Secondary | ICD-10-CM | POA: Diagnosis not present

## 2014-03-06 ENCOUNTER — Other Ambulatory Visit: Payer: Self-pay | Admitting: *Deleted

## 2014-03-06 DIAGNOSIS — R059 Cough, unspecified: Secondary | ICD-10-CM

## 2014-03-06 DIAGNOSIS — R05 Cough: Secondary | ICD-10-CM

## 2014-03-06 MED ORDER — OMEPRAZOLE 20 MG PO CPDR: 20.0000 mg | DELAYED_RELEASE_CAPSULE | Freq: Every day | ORAL | Status: DC

## 2014-03-07 ENCOUNTER — Telehealth: Payer: Self-pay | Admitting: Emergency Medicine

## 2014-03-07 MED ORDER — PREDNISONE 20 MG PO TABS: 20.0000 mg | ORAL_TABLET | Freq: Every day | ORAL | Status: DC

## 2014-03-07 NOTE — Telephone Encounter (Signed)
Spoke with the pt  Rx was refilled  Nothing further needed

## 2014-03-25 ENCOUNTER — Telehealth: Payer: Self-pay | Admitting: Emergency Medicine

## 2014-03-25 NOTE — Telephone Encounter (Signed)
Spoke with Dr Georgiana Shore about potential reversal of her ileostomy. She understands that she is high risk for increased vent days, hospital days or even death. All that said, there is no absolute contraindication for surgery. All parties understand the risks. Dr Georgiana Shore will discuss with her today and let her weigh in on how much she wants to get the procedure done. I will help in any way pre-or post-op.

## 2014-04-02 DIAGNOSIS — E119 Type 2 diabetes mellitus without complications: Secondary | ICD-10-CM | POA: Diagnosis not present

## 2014-04-02 DIAGNOSIS — H35379 Puckering of macula, unspecified eye: Secondary | ICD-10-CM | POA: Diagnosis not present

## 2014-04-08 DIAGNOSIS — K469 Unspecified abdominal hernia without obstruction or gangrene: Secondary | ICD-10-CM | POA: Diagnosis not present

## 2014-04-08 DIAGNOSIS — Z432 Encounter for attention to ileostomy: Secondary | ICD-10-CM | POA: Diagnosis not present

## 2014-04-08 DIAGNOSIS — D72829 Elevated white blood cell count, unspecified: Secondary | ICD-10-CM | POA: Diagnosis not present

## 2014-06-03 ENCOUNTER — Encounter: Payer: Self-pay | Admitting: Emergency Medicine

## 2014-06-03 ENCOUNTER — Ambulatory Visit (INDEPENDENT_AMBULATORY_CARE_PROVIDER_SITE_OTHER): Payer: Medicare Other | Admitting: Emergency Medicine

## 2014-06-03 VITALS — BP 118/60 | HR 93 | Ht 64.0 in | Wt 172.8 lb

## 2014-06-03 DIAGNOSIS — I2510 Atherosclerotic heart disease of native coronary artery without angina pectoris: Secondary | ICD-10-CM | POA: Diagnosis not present

## 2014-06-03 DIAGNOSIS — J449 Chronic obstructive pulmonary disease, unspecified: Secondary | ICD-10-CM | POA: Diagnosis not present

## 2014-06-03 NOTE — Patient Instructions (Signed)
Please continue your medications as you have been taking them Continue your oxygen at 3-4 L/m We talked today about the severity of her lung disease and the risks of undergoing CPR, mechanical ventilation, life support, ICU care. I fully support your decision to not undergo CPR or life support in the event of sudden change in her medical status. Keep your official DO NOT RESUSCITATE paperwork available at home for any caregivers..  Follow with Dr Delton CoombesByrum in 3 months or sooner if you have any problems.

## 2014-06-03 NOTE — Assessment & Plan Note (Signed)
Please continue your medications as you have been taking them Continue your oxygen at 3-4 L/m We talked today about the severity of her lung disease and the risks of undergoing CPR, mechanical ventilation, life support, ICU care. I fully support your decision to not undergo CPR or life support in the event of sudden change in her medical status. Keep your official DO NOT RESUSCITATE paperwork available at home for any caregivers..  Follow with Dr Byrum in 3 months or sooner if you have any problems.  

## 2014-06-03 NOTE — Progress Notes (Signed)
Ms. Andrey CampanileWilson is a 73 year old woman who follows up today for her COPD, dyspnea, and for a history of a RLL pulmonary nodule. 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 No sgy has been planned due to her significant lung disease. Also with hx depression.   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 Could not tolerate Daliresp. She has been rx with pred + abx x 1 since last visit by PCP. Currently at baseline, O2 is set on 3L/min. She does have some wheezing. Taking loratadine and fluticasone every day.   ROV 07/26/11 -- Severe COPD on O2, chronic cough being rx for allergies and GERD. She is here for regular f/u. She mentions today that she is having some cyanosis, coolness and numbness in her feet for the last 5 -6 months. No w/u done yet, but she is going to mention to her PCP. Breathing has been stable, no flares since last visit. Taking Spiriva + Symbicort + SABA; uses albuterol at least once a day.   ROV 10/31/11 -- Severe COPD on O2, chronic cough being rx for allergies and GERD. Having more nasal sx, gtt, wheezing.  She has not had flare since last time.  Interested in Research scientist (medical)lightwt portable concentrator.   ROV 02/01/12 -- Severe COPD on O2, chronic cough being rx for allergies and GERD. Since last visit she has undergone Myoview for atypical CP, reassuring study. 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 CT chest > no evidence PE.  Breathing has been stable, still limiting her exertion. Rarely uses SABA.  Spiriva + Symbicort, O2 on 3L/min. She believes that stopping cymbalta has helped her. Some occasional cough.   ROV 06/05/12 -- Severe COPD on O2, chronic cough being rx for allergies and GERD.  CAT score today 23. Spiriva + Symbicort, O2 on 3L/min.  Reports today that her energy is very low, not sure that it is worsening of breathing. She is using albuterol about every day. She had the flu shot. She had prednisone/abx in September. Went to ED in august  after a fall.   ROV 09/21/12 -- Severe COPD on O2, chronic cough being rx for allergies and GERD.  Her breathing is very limiting. She has been rx with pred x 2 since last time.   ROV 10/22/12 -- Severe COPD on O2, chronic cough being rx for allergies and GERD. Returns today reporting worsening dyspnea. We started daily pred a month ago to see if she would benefit. She can tell a difference at rest and w sleeping. No real difference in exertional SOB  ROV 11/21/12 -- Severe COPD, hypoxemia, chronic cough, GERD, allergic chronic rhinitis. We have initiated chronic pred, started tapering last visit, currently 15. Since last time she was evaluated by Dr Earlene Plateravis for increased exertional SOB, overall malaise, was sent for CT chest as below > no PE, no infiltrates, stable emphysema. Also had thyroid studies, showed high TSH and synthroid increased. Her breathing is Ok at rest, worse with exertion.  Using duonebs 2x a day, spiriva + symbicort.   ROV 01/02/13 -- Severe COPD, hypoxemia, chronic cough, GERD, allergic chronic rhinitis. Her chronic pred is at 20mg  (increased last time). She doesn't notice any real change in breathing. Cough is stable. She wheezes with exertion.    ROV 02/22/13 -- Severe COPD, hypoxemia, chronic cough, GERD, allergic chronic rhinitis, chronic pred 20. She presents today with about 3 weeks of increased DOE, some increased LE edema. No  change in cough or wheeze. Has to rest with 20 ft. Using duonebs 3-4x a day. O2 on 4L/min pulsed (continuous at home). Her wt is stable.   ROV 03/27/13 -- Severe COPD, hypoxemia, chronic cough, GERD, allergic chronic rhinitis, chronic pred 20. [facility] time [facility] see if she would benefit.   ROV 06/21/13 -- Severe COPD, hypoxemia, chronic cough, GERD, allergic chronic rhinitis, chronic pred 20. She unfortunately has been dealing with with several problems > anemia, abnormal thyroid testing, has had falls since last time. She is to  see NSGY for newly identified aneurysm. She feel like she loses power in her legs, not really a balance problem. He O2 is set on 3-4L/min. Happens going from seated to standing position. She has had some disorientation, ? Related to pred.   ROV 10/23/13 -- severe COPD, hypoxemia, chronic cough. Chronic pred 20. Seen by [name] 4/1 for mid-back pain. Still happens intermittently.  She is taking benadryl and zyrtec. She continues to have exertional SOB. Also some instability. She is on breo + Spiriva.   ROV 12/13/13 -- severe COPD on pred 20, hypoxemia, chronic cough. She unfortunately experienced a ruptured divertic, required ileostomy. She was readmitted for hypercapneic resp failure and required BiPAP temporarily. She is now back at home.  She wonders about whether she needs BiPAP.   ROV 02/25/14 -- follow up for severe COPD, hypoxemia, chronic cough. She is on Breo + Spiriva. She uses duonebs 1-2x a day. Her O2 is on 3 pulsed w exertion.  Her stable Pred dose is 20mg . She is wondering if she can have her ileostomy reversed - but she acknowledges that it would be high risk. No flares, no new meds. Sx are stable.   ROV 06/03/14 -- Follow-up visit for COPD, interstitial lung disease, hypoxemia. She remains on Breo, Spiriva and prednisone 20 mg daily. She is quite limited with her daily activities, is stable at rest. We talked today about her life expectancy, the risks of CPR or mechanical ventilation. She underscored  her desire to be DO NOT RESUSCITATE.    CAT Score 10/22/2012 09/21/2012 06/04/2012  Total CAT Score 26 23 23     PE:  Filed Vitals:   16/04/9611/01/15 1227  BP: 118/60  Pulse: 93  Height: 5\' 4"  (1.626 m)  Weight: 172 lb 12.8 oz (78.382 kg)  SpO2: 96%   Gen: Pleasant, obese, in no distress,   ENT: No lesions,  mouth clear,  oropharynx clear, no postnasal drip  Neck: No JVD, no TMG, no carotid bruits  Lungs: Distant  BS w/ no wheezing, very distant  Cardiovascular: RRR, heart sounds  normal, no murmur or gallops, trace edema  Musculoskeletal: No deformities, no cyanosis or clubbing  Neuro: alert, non focal  Skin: Warm, no lesions or rashes   12/19/11 Myoview:  Overall Impression: Normal stress nuclear study with a small, mild, fixed apical defect consistent with thinning; no ischemia.  LV Ejection Fraction: 79%. LV Wall Motion: NL LV Function; NL Wall Motion  CT chest 11/06/12 >>  No PE, centra-lobular emphysema, no infiltrates or nodules, stable thoracic aortic aneurysm   COPD (chronic obstructive pulmonary disease) Please continue your medications as you have been taking them Continue your oxygen at 3-4 L/m We talked today about the severity of her lung disease and the risks of undergoing CPR, mechanical ventilation, life support, ICU care. I fully support your decision to not undergo CPR or life support in the event of sudden  change in her medical status. Keep your official DO NOT RESUSCITATE paperwork available at home for any caregivers..  Follow with Dr Delton CoombesByrum in 3 months or sooner if you have any problems.

## 2014-06-26 DIAGNOSIS — J449 Chronic obstructive pulmonary disease, unspecified: Secondary | ICD-10-CM | POA: Diagnosis not present

## 2014-06-26 DIAGNOSIS — E782 Mixed hyperlipidemia: Secondary | ICD-10-CM | POA: Diagnosis not present

## 2014-06-26 DIAGNOSIS — I10 Essential (primary) hypertension: Secondary | ICD-10-CM | POA: Diagnosis not present

## 2014-06-26 DIAGNOSIS — R202 Paresthesia of skin: Secondary | ICD-10-CM | POA: Diagnosis not present

## 2014-06-26 DIAGNOSIS — E034 Atrophy of thyroid (acquired): Secondary | ICD-10-CM | POA: Diagnosis not present

## 2014-06-26 DIAGNOSIS — E119 Type 2 diabetes mellitus without complications: Secondary | ICD-10-CM | POA: Diagnosis not present

## 2014-07-09 ENCOUNTER — Other Ambulatory Visit: Payer: Self-pay | Admitting: *Deleted

## 2014-07-09 DIAGNOSIS — I719 Aortic aneurysm of unspecified site, without rupture: Secondary | ICD-10-CM

## 2014-08-17 DIAGNOSIS — E1165 Type 2 diabetes mellitus with hyperglycemia: Secondary | ICD-10-CM | POA: Diagnosis not present

## 2014-08-17 DIAGNOSIS — R5383 Other fatigue: Secondary | ICD-10-CM | POA: Diagnosis not present

## 2014-08-17 DIAGNOSIS — R5381 Other malaise: Secondary | ICD-10-CM | POA: Diagnosis not present

## 2014-08-17 DIAGNOSIS — J069 Acute upper respiratory infection, unspecified: Secondary | ICD-10-CM | POA: Diagnosis not present

## 2014-08-20 DIAGNOSIS — R51 Headache: Secondary | ICD-10-CM | POA: Diagnosis not present

## 2014-08-20 DIAGNOSIS — J0100 Acute maxillary sinusitis, unspecified: Secondary | ICD-10-CM | POA: Diagnosis not present

## 2014-08-20 DIAGNOSIS — R202 Paresthesia of skin: Secondary | ICD-10-CM | POA: Diagnosis not present

## 2014-08-21 DIAGNOSIS — E875 Hyperkalemia: Secondary | ICD-10-CM | POA: Diagnosis not present

## 2014-08-26 ENCOUNTER — Other Ambulatory Visit: Payer: Self-pay | Admitting: Cardiothoracic Surgery

## 2014-08-26 DIAGNOSIS — I712 Thoracic aortic aneurysm, without rupture: Secondary | ICD-10-CM | POA: Diagnosis not present

## 2014-08-26 LAB — CREATININE, SERUM: Creat: 1.8 mg/dL — ABNORMAL HIGH (ref 0.50–1.10)

## 2014-08-26 LAB — BUN: BUN: 59 mg/dL — ABNORMAL HIGH (ref 6–23)

## 2014-08-27 ENCOUNTER — Other Ambulatory Visit: Payer: Self-pay | Admitting: Emergency Medicine

## 2014-08-27 MED ORDER — FLUTICASONE PROPIONATE 50 MCG/ACT NA SUSP: 2.0000 | Freq: Every day | NASAL | Status: DC

## 2014-08-28 ENCOUNTER — Ambulatory Visit
Admission: RE | Admit: 2014-08-28 | Discharge: 2014-08-28 | Disposition: A | Discharge status: Home / Self Care | Payer: Medicare Other | Source: Ambulatory Visit | Attending: Cardiothoracic Surgery | Admitting: Cardiothoracic Surgery

## 2014-08-28 ENCOUNTER — Other Ambulatory Visit: Payer: Self-pay | Admitting: *Deleted

## 2014-08-28 ENCOUNTER — Ambulatory Visit (INDEPENDENT_AMBULATORY_CARE_PROVIDER_SITE_OTHER): Payer: Medicare Other | Admitting: Cardiothoracic Surgery

## 2014-08-28 ENCOUNTER — Encounter: Payer: Self-pay | Admitting: Cardiothoracic Surgery

## 2014-08-28 VITALS — BP 98/63 | HR 102 | Resp 18 | Ht 64.0 in | Wt 177.5 lb

## 2014-08-28 DIAGNOSIS — I719 Aortic aneurysm of unspecified site, without rupture: Secondary | ICD-10-CM

## 2014-08-28 DIAGNOSIS — I712 Thoracic aortic aneurysm, without rupture: Secondary | ICD-10-CM | POA: Diagnosis not present

## 2014-08-28 DIAGNOSIS — M4854XA Collapsed vertebra, not elsewhere classified, thoracic region, initial encounter for fracture: Secondary | ICD-10-CM | POA: Diagnosis not present

## 2014-08-28 DIAGNOSIS — J432 Centrilobular emphysema: Secondary | ICD-10-CM | POA: Diagnosis not present

## 2014-08-28 DIAGNOSIS — I77810 Thoracic aortic ectasia: Secondary | ICD-10-CM

## 2014-08-28 NOTE — Progress Notes (Signed)
Patient ID: Morgan LeepJudy Hays, female   DOB: 02/27/1941, 74 y.o.   MRN: [id number]                                             Morgan LeepJudy [facility] #[id number] Date of Birth: 08/30/1940  Referring: Leslye PeerByrum, Robert S, MD Primary Care: Haskel KhanJOHNSON,ANDREA, PA-C Pulmonology: Dr. Delton CoombesByrum Cardiology: Dr. Jens Somrenshaw  Chief Complaint:    Ascending aortic aneurysm   History of Present Illness:     Patient returns with followup CTs of the chest to evaluate the size of her dilated ascending and descending aorta. She is followed both by pulmonology and cardiology. The patient remains very limited on home oxygen, and very sedentary. Since last seen the patient appears to be in more frail than when I last saw her. Since she was last seen she developed a perforated colonic diverticulum and had emergency bowel surgery including colostomy. She is unaware of any changes in her renal function, however on labs prior to the CT today her creatinine was noted to be elevated to 1.8 previous years have seen her her creatinines run between 0.6 and 0.9. She is on nonsteroidal oral anti-inflammatories and a ARB [facility] more told her that I told her to start told the story that  Current Activity/ Functional Status: Patient is very limited in her functional ability notes that she becomes short of breath walking less than 100 feet. His generalized weakness. Notes it should become short of breath just sweeping the floor in her house and is not engaged in much physical activity primarily secondary to shortness of breath.   Zubrod Score: At the time of surgery this patient's most appropriate activity status/level should be described as: []     0    Normal activity, no symptoms []     1    Restricted in physical strenuous activity but ambulatory, able to do out light work []     2    Ambulatory and capable of self care, unable to do work activities, up and about >50 % of waking hours                               [x]     3    Only limited self care, in bed greater than 50% of waking hours []     4    Completely disabled, no self care, confined to bed or chair []     5    Moribund   Past Medical History  Diagnosis Date  . Supraventricular tachycardia   . MVP (mitral valve prolapse)   . Hypothyroid   . Hypertension   . Aortic aneurysm   . Pulmonary nodule   . Varicose vein   . Arthritis   . Depression   . COPD (chronic obstructive pulmonary disease)   . CAD (coronary artery disease)     Past Surgical History  Procedure Laterality Date  . Appendectomy  1956  . Total abdominal hysterectomy  1975  . Tubal ligation  1972  . Breast surgery      benign left breast cyst removed    History  Smoking status  . Former Smoker -- 1.00 packs/day for 50 years  . Types: Cigarettes  . Quit date: 07/05/1999  Smokeless tobacco  . Never  Used    History  Alcohol Use: Not on file    History   Social History  . Marital Status: Married    Spouse Name: N/A    Number of Children: N/A  . Years of Education: N/A   Occupational History  . LPN    Social History Main Topics  . Smoking status: Former Smoker -- 1.0 packs/day for 50 years    Types: Cigarettes    Quit date: 07/05/1999  . Smokeless tobacco: Not on file  . Alcohol Use: Not on file  . Drug Use: Not on file  . Sexually Active: Not on file    Social History Narrative   Pt is married and lives with her husband. She works as an Public house manager.  She has not had any known TB exposures.  She is a former smoker.  Quit in 2001.  She has approx 50 pack year total history.  Does not use alcohol     Allergies  Allergen Reactions  . Ambien [Zolpidem Tartrate]   . Cymbalta [Duloxetine Hcl]     Intolerance   . Daliresp [Roflumilast] Diarrhea  . Doxycycline Nausea And Vomiting  . Lisinopril   . Lopressor [Metoprolol Tartrate]     Intolerance   . Oxycodone     Hallucinations "Made me feel crazy"    Current Outpatient  Prescriptions  Medication Sig Dispense Refill  . albuterol (PROVENTIL HFA) 108 (90 BASE) MCG/ACT inhaler Inhale 2 puffs into the lungs every 6 (six) hours as needed.        [name] albuterol (PROVENTIL) (2.5 MG/3ML) 0.083% nebulizer solution Take 2.5 mg by nebulization 2 (two) times daily.        [name] aspirin 81 MG tablet Take 81 mg by mouth daily.        . budesonide-formoterol (SYMBICORT) 160-4.5 MCG/ACT inhaler Inhale 2 puffs into the lungs 2 (two) times daily.        . butalbital-acetaminophen-caffeine (FIORICET WITH CODEINE) 50-325-40-30 MG per capsule Take 1 capsule by mouth every 6 (six) hours as needed.        . Calcium Carbonate (CALCIUM 500 PO) Take 1 capsule by mouth 2 (two) times daily.        . Cholecalciferol (VITAMIN D) 1000 UNITS capsule Take 1,000 Units by mouth daily.        . fluticasone (FLONASE) 50 MCG/ACT nasal spray Place 2 sprays into the nose daily.  16 g  5  . levothyroxine (SYNTHROID, LEVOTHROID) 125 MCG tablet Take 125 mcg by mouth daily.        [name] loratadine (CLARITIN) 10 MG tablet Take 10 mg by mouth daily.        [name] LORazepam (ATIVAN) 1 MG tablet Take 1 tablet (1 mg total) by mouth 2 (two) times daily.  15 tablet  0  . magnesium oxide (MAG-OX) 400 MG tablet Take 400 mg by mouth daily.        [name] olmesartan-hydrochlorothiazide (BENICAR HCT) 40-12.5 MG per tablet Take 1 tablet by mouth daily.  30 tablet  12  . omeprazole (PRILOSEC) 20 MG capsule Take 1 capsule (20 mg total) by mouth daily.  30 capsule  5  . ondansetron (ZOFRAN) 4 MG tablet Take 1 tablet (4 mg total) by mouth every 8 (eight) hours as needed for nausea.  20 tablet  0  . pravastatin (PRAVACHOL) 40 MG tablet Take 40 mg by mouth daily.        . sertraline (ZOLOFT) 50 MG tablet Take 100 mg  by mouth 2 (two) times daily.       [name] tiotropium (SPIRIVA) 18 MCG inhalation capsule Place 18 mcg into inhaler and inhale daily.        . traZODone (DESYREL) 50 MG tablet Take 50 mg by mouth at bedtime.             Family  History  Problem Relation Age of Onset  . Heart disease Father   . Heart disease Son   . Heart disease Mother   . Heart disease Other     grandfather      Review of Systems:     Cardiac Review of Systems: Y or N  Chest Pain [  n  ]  Resting SOB [ y  ] Exertional SOB  [name]  ]  Orthopnea [name]  ]   Pedal Edema [name]   ]    Palpitations [name]  ] Syncope  [name]  ]   Presyncope [ n  ]  General Review of Systems: [Y] = yes [  ]=no Constitional: recent weight change [ n ]; anorexia [  ]; fatigue [ y ]; nausea [  ]; night sweats [  ]; fever [ n ]; or chills [ n ];                                                                                                                                          Dental: poor dentition[  ];  Eye : blurred vision [  ]; diplopia [   ]; vision changes [  ];  Amaurosis fugax[  ]; Resp: cough [ y];  wheezing[ y ];  hemoptysis[n  ]; shortness of breath[y  ]; paroxysmal nocturnal dyspnea[  ]; dyspnea on exertion[ y ]; or orthopnea[  y];  GI:  gallstones[  ], vomiting[  ];  dysphagia[  ]; melena[  ];  hematochezia [  ]; heartburn[  ];   Hx of  Colonoscopy[  ]; GU: kidney stones [  ]; hematuria[  ];   dysuria [  ];  nocturia[  ];  history of     obstruction [  ];             Skin: rash, swelling[  ];, hair loss[  ];  peripheral edema[  ];  or itching[  ]; Musculosketetal: myalgias[  ];  joint swelling[  ];  joint erythema[  ];  joint pain[  ];  back pain[  ];  Heme/Lymph: bruising[  ];  bleeding[  ];  anemia[  ];  Neuro: TIA[  ];  headaches[  ];  stroke[  ];  vertigo[  ];  seizures[  ];   paresthesias[  ];  difficulty walking[yes complains of fibromyalgia   ];  Psych:depression[  ]; anxiety[  ];  Endocrine: diabetes[  ];  thyroid dysfunction[  ];  Immunizations: Flu [ y ]; Pneumococcal[y  ];  Other:  Physical Exam: BP 98/63 mmHg  Pulse 102  Resp 18  Ht  (1.626 m)  Wt 177 lb 8 oz (80.513 kg)  BMI 30.45 kg/m2  SpO2 91%  General appearance: alert, appears older than  stated age, no distress, moderately obese and Short of breath at rest on oxygen in the office. She gets short of breath walking from the lobby to the exam room and getting her weight Neurologic: intact Heart: regular rate and rhythm, S1, S2 normal, no murmur, click, rub or gallop do not hear murmur of AS Lungs: diminished breath sounds bilaterally Abdomen: soft, non-tender; bowel sounds normal; no masses,  no organomegaly Extremities: extremities normal, atraumatic, no cyanosis or edema and Homans sign is negative, no sign of DVT   Diagnostic Studies & Laboratory data:     Recent Radiology Findings:   Ct Chest Wo Contrast  08/28/2014   CLINICAL DATA:  Evaluate size of thoracic aorta.  EXAM: CT CHEST WITHOUT CONTRAST  TECHNIQUE: Multidetector CT imaging of the chest was performed following the standard protocol without IV contrast.  COMPARISON:  CT thorax 11/06/2012  FINDINGS: Ascending aorta measures 53 mm in maximum dimension compared to 52 mm on 11/06/2012. The descending thoracic aorta measures 42 mm compared to 40 mm on prior (image 30, series 3). The transverse arch measures 44 mm compared to 42 mm on prior. There is intimal calcification aorta. The great vessels are normal. No pericardial fluid. The descending thoracic aorta is ectatic.  Mild centrilobular emphysema in the upper lobes. No measurable nodularity.  Limited view of the upper abdomen is unremarkable. Limited view skeleton demonstrates compression fracture at T11 which is new from 12/05/2013 with approximately 60% loss of vertebral body height anteriorly. No retropulsion.  IMPRESSION: 1. Ascending aorta measures 1 mm larger at 53 mm compared to 12/05/2013 . 2. Descending thoracic aorta measures 2 mm larger at 42 mm. 3. Subacute compression fracture at T11 is new from 12/05/2013.   Electronically Signed   By: Genevive Bi M.D.   On: 08/28/2014 12:52   I have independently reviewed the above radiology studies  and reviewed the  findings with the patient.    Recent Lab Findings: Lab Results  Component Value Date   WBC 7.8 11/29/2006   HGB 12.3 11/29/2006   HCT 36.4 11/29/2006   PLT 313 11/29/2006   GLUCOSE 88 12/12/2011   CHOL 145 05/31/2010   TRIG 119.0 05/31/2010   HDL 46.60 05/31/2010   LDLCALC 75 05/31/2010   ALT 19 05/31/2010   AST 22 05/31/2010   NA 137 12/12/2011   K 4.2 12/12/2011   CL 95* 12/12/2011   CREATININE 1.80* 08/26/2014   BUN 59* 08/26/2014   CO2 35* 12/12/2011   TSH 1.71 09/17/2007   INR 1.1 RATIO 11/29/2006   Aortic Size Index=     5.3 cm    /Body surface area is 1.91 meters squared. = 2.77  < 2.75 cm/m2      4% risk per year 2.75 to 4.25          8% risk per year > 4.25 cm/m2    20% risk per year  Chronic Kidney Disease   Stage I     GFR >90  Stage II    GFR 60-89  Stage IIIA GFR 45-59  Stage IIIB GFR 30-44  Stage IV   GFR 15-29  Stage V    GFR  <15  Lab Results  Component Value Date   CREATININE  1.80* 08/26/2014   Estimated Creatinine Clearance: 29.4 mL/min (by C-G formula based on Cr of 1.8).    Assessment / Plan:    Patient is a 74 year old female with severe underlying pulmonary disease who returns for followup CT of the chest. She has a known ascending aortic aneurysm at approximately 5 cm. Reviewing the scans from 2008 there's been just very slight change. With her significant underlying pulmonary disease and her desire not to have any major invasive procedures done I recommended that we continue with observation and blood pressure control. She is aware of the risk of sudden dissection and/or rupture. The patient has again confirmed this year she does not wish to have operative intervention for repair of enlarged aorta. I discussed with her in detail the risks and options of both elective repair of her ascending aorta and emergency repair. She is clear she is not interested in surgery, but would like to return in one year for follow-up visit to see if there are any  changes.  Subacute compression fracture at T11 is new from 12/05/2013  Now with Stage IV chronic renal disease  not previously known on her last visit 1 year ago- she will contact her primary care physician tomorrow to arrange for evaluation of what appears to be fairly sudden change in her renal function in [place].- No contrast was given for her CT scan today. She notes she schedule for CT scan of the brain early next week I cautioned her to make sure her ordering physician for this scan is aware of her change in renal function.  Severe COPD  I  spent 40 minutes counseling the patient face to face and 50% or more the  time was spent in counseling and coordination of care. The total time spent in the appointment was 60 minutes.   Delight Ovens MD 08/28/2014 2:58 PM

## 2014-09-01 DIAGNOSIS — R51 Headache: Secondary | ICD-10-CM | POA: Diagnosis not present

## 2014-09-01 DIAGNOSIS — R799 Abnormal finding of blood chemistry, unspecified: Secondary | ICD-10-CM | POA: Diagnosis not present

## 2014-09-01 DIAGNOSIS — Q283 Other malformations of cerebral vessels: Secondary | ICD-10-CM | POA: Diagnosis not present

## 2014-09-16 ENCOUNTER — Ambulatory Visit: Payer: Medicare Other | Admitting: Emergency Medicine

## 2014-09-24 DIAGNOSIS — I10 Essential (primary) hypertension: Secondary | ICD-10-CM | POA: Diagnosis not present

## 2014-09-24 DIAGNOSIS — M150 Primary generalized (osteo)arthritis: Secondary | ICD-10-CM | POA: Diagnosis not present

## 2014-09-24 DIAGNOSIS — E782 Mixed hyperlipidemia: Secondary | ICD-10-CM | POA: Diagnosis not present

## 2014-09-24 DIAGNOSIS — E034 Atrophy of thyroid (acquired): Secondary | ICD-10-CM | POA: Diagnosis not present

## 2014-09-24 DIAGNOSIS — J449 Chronic obstructive pulmonary disease, unspecified: Secondary | ICD-10-CM | POA: Diagnosis not present

## 2014-09-24 DIAGNOSIS — D649 Anemia, unspecified: Secondary | ICD-10-CM | POA: Diagnosis not present

## 2014-09-24 DIAGNOSIS — E1140 Type 2 diabetes mellitus with diabetic neuropathy, unspecified: Secondary | ICD-10-CM | POA: Diagnosis not present

## 2014-10-10 ENCOUNTER — Ambulatory Visit (INDEPENDENT_AMBULATORY_CARE_PROVIDER_SITE_OTHER): Payer: Medicare Other | Admitting: Emergency Medicine

## 2014-10-10 ENCOUNTER — Encounter: Payer: Self-pay | Admitting: Emergency Medicine

## 2014-10-10 VITALS — BP 120/62 | HR 110 | Ht 64.0 in | Wt 177.0 lb

## 2014-10-10 DIAGNOSIS — J449 Chronic obstructive pulmonary disease, unspecified: Secondary | ICD-10-CM

## 2014-10-10 MED ORDER — ALBUTEROL SULFATE HFA 108 (90 BASE) MCG/ACT IN AERS: 1.0000 | INHALATION_SPRAY | Freq: Four times a day (QID) | RESPIRATORY_TRACT | Status: DC | PRN

## 2014-10-10 NOTE — Progress Notes (Signed)
Morgan Hays is a 74 year old woman who follows up today for her COPD, dyspnea, and for a history of a RLL pulmonary nodule. 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 No sgy has been planned due to her significant lung disease. Also with hx depression.   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 Could not tolerate Daliresp. She has been rx with pred + abx x 1 since last visit by PCP. Currently at baseline, O2 is set on 3L/min. She does have some wheezing. Taking loratadine and fluticasone every day.   ROV 07/26/11 -- Severe COPD on O2, chronic cough being rx for allergies and GERD. She is here for regular f/u. She mentions today that she is having some cyanosis, coolness and numbness in her feet for the last 5 -6 months. No w/u done yet, but she is going to mention to her PCP. Breathing has been stable, no flares since last visit. Taking Spiriva + Symbicort + SABA; uses albuterol at least once a day.   ROV 10/31/11 -- Severe COPD on O2, chronic cough being rx for allergies and GERD. Having more nasal sx, gtt, wheezing.  She has not had flare since last time.  Interested in Research scientist (medical).   ROV 02/01/12 -- Severe COPD on O2, chronic cough being rx for allergies and GERD. Since last visit she has undergone Myoview for atypical CP, reassuring study. 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 CT chest > no evidence PE.  Breathing has been stable, still limiting her exertion. Rarely uses SABA.  Spiriva + Symbicort, O2 on 3L/min. She believes that stopping cymbalta has helped her. Some occasional cough.   ROV 06/05/12 -- Severe COPD on O2, chronic cough being rx for allergies and GERD.  CAT score today 23. Spiriva + Symbicort, O2 on 3L/min.  Reports today that her energy is very low, not sure that it is worsening of breathing. She is using albuterol about every day. She had the flu shot. She had prednisone/abx in September. Went to ED in august  after a fall.   ROV 09/21/12 -- Severe COPD on O2, chronic cough being rx for allergies and GERD.  Her breathing is very limiting. She has been rx with pred x 2 since last time.   ROV 10/22/12 -- Severe COPD on O2, chronic cough being rx for allergies and GERD. Returns today reporting worsening dyspnea. We started daily pred a month ago to see if she would benefit. She can tell a difference at rest and w sleeping. No real difference in exertional SOB  ROV 11/21/12 -- Severe COPD, hypoxemia, chronic cough, GERD, allergic chronic rhinitis. We have initiated chronic pred, started tapering last visit, currently 15. Since last time she was evaluated by [name] for increased exertional SOB, overall malaise, was sent for CT chest as below > no PE, no infiltrates, stable emphysema. Also had thyroid studies, showed high TSH and synthroid increased. Her breathing is Ok at rest, worse with exertion.  Using duonebs 2x a day, spiriva + symbicort.   ROV 01/02/13 -- Severe COPD, hypoxemia, chronic cough, GERD, allergic chronic rhinitis. Her chronic pred is at  (increased last time). She doesn't notice any real change in breathing. Cough is stable. She wheezes with exertion.    ROV 02/22/13 -- Severe COPD, hypoxemia, chronic cough, GERD, allergic chronic rhinitis, chronic pred 20. She presents today with about 3 weeks of increased DOE, some increased LE edema. No  change in cough or wheeze. Has to rest with 20 ft. Using duonebs 3-4x a day. O2 on 4L/min pulsed (continuous at home). Her wt is stable.   ROV 03/27/13 -- Severe COPD, hypoxemia, chronic cough, GERD, allergic chronic rhinitis, chronic pred 20. [facility] if [facility] would [facility].   ROV 06/21/13 -- Severe COPD, hypoxemia, chronic cough, GERD, allergic chronic rhinitis, chronic pred 20. She unfortunately has been dealing with with several problems > anemia, abnormal thyroid testing, has had falls since last time. She is to  see NSGY for newly identified aneurysm. She feel like she loses power in her legs, not really a balance problem. He O2 is set on 3-4L/min. Happens going from seated to standing position. She has had some disorientation, ? Related to pred.   ROV 10/23/13 -- severe COPD, hypoxemia, chronic cough. Chronic pred 20. Seen by [name] 4/1 for mid-back pain. Still happens intermittently.  She is taking benadryl and zyrtec. She continues to have exertional SOB. Also some instability. She is on breo + Spiriva.   ROV 12/13/13 -- severe COPD on pred 20, hypoxemia, chronic cough. She unfortunately experienced a ruptured divertic, required ileostomy. She was readmitted for hypercapneic resp failure and required BiPAP temporarily. She is now back at home.  She wonders about whether she needs BiPAP.   ROV 02/25/14 -- follow up for severe COPD, hypoxemia, chronic cough. She is on Breo + Spiriva. She uses duonebs 1-2x a day. Her O2 is on 3 pulsed w exertion.  Her stable Pred dose is 20mg . She is wondering if she can have her ileostomy reversed - but she acknowledges that it would be high risk. No flares, no new meds. Sx are stable.   ROV 06/03/14 -- Follow-up visit for COPD, interstitial lung disease, hypoxemia. She remains on Breo, Spiriva and prednisone 20 mg daily. She is quite limited with her daily activities, is stable at rest. We talked today about her life expectancy, the risks of CPR or mechanical ventilation. She underscored  her desire to be DO NOT RESUSCITATE.   ROV 10/10/14 -- follow-up visit for hypoxemic respiratory failure in the setting of interstitial lung disease and COPD. She's currently managed on prednisone 20 mg daily as well as Spiriva and Breo. She is doing fairly well. She has not had any hospitalizations, but she did go to urgent care and was treated for sinusitis.    CAT Score 10/22/2012 09/21/2012 06/04/2012  Total CAT Score 26 23 23     PE:  Filed Vitals:   10/10/14 1604  BP: 120/62  Pulse: 110   Height: 5\' 4"  (1.626 m)  Weight: 177 lb (80.287 kg)  SpO2: 97%   Gen: Pleasant, obese, in no distress,   ENT: No lesions,  mouth clear,  oropharynx clear, no postnasal drip  Neck: No JVD, no TMG, no carotid bruits  Lungs: Distant  BS w/ no wheezing, very distant  Cardiovascular: RRR, heart sounds normal, no murmur or gallops, trace edema  Musculoskeletal: No deformities, no cyanosis or clubbing  Neuro: alert, non focal  Skin: Warm, no lesions or rashes   12/19/11 Myoview:  Overall Impression: Normal stress nuclear study with a small, mild, fixed apical defect consistent with thinning; no ischemia.  LV Ejection Fraction: 79%. LV Wall Motion: NL LV Function; NL Wall Motion  CT chest 11/06/12 >>  No PE, centra-lobular emphysema, no infiltrates or nodules, stable thoracic aortic aneurysm   COPD (chronic obstructive pulmonary disease)  Please continue your Spiriva and Breo for now.  We may consider changing to Stiolto in the future We will change your albuterol from Proventil to Ventolin Continue  your prednisone  daily Follow with Dr Delton Coombes in 4 months or sooner if you have any problems.

## 2014-10-10 NOTE — Assessment & Plan Note (Signed)
Please continue your Spiriva and Breo for now.  We may consider changing to Stiolto in the future We will change your albuterol from Proventil to Ventolin Continue  your prednisone 20mg  daily Follow with Dr Delton CoombesByrum in 4 months or sooner if you have any problems.

## 2014-10-10 NOTE — Patient Instructions (Signed)
Please continue your Spiriva and Breo for now.  We may consider changing to Stiolto in the future We will change your albuterol from Proventil to Ventolin Continue  your prednisone 20mg daily Follow with Dr Elinore Shults in 4 months or sooner if you have any problems. 

## 2014-10-21 DIAGNOSIS — D649 Anemia, unspecified: Secondary | ICD-10-CM | POA: Diagnosis not present

## 2014-10-24 DIAGNOSIS — J441 Chronic obstructive pulmonary disease with (acute) exacerbation: Secondary | ICD-10-CM | POA: Diagnosis not present

## 2014-10-26 DIAGNOSIS — E860 Dehydration: Secondary | ICD-10-CM | POA: Diagnosis not present

## 2014-10-26 DIAGNOSIS — E119 Type 2 diabetes mellitus without complications: Secondary | ICD-10-CM | POA: Diagnosis present

## 2014-10-26 DIAGNOSIS — N289 Disorder of kidney and ureter, unspecified: Secondary | ICD-10-CM | POA: Diagnosis not present

## 2014-10-26 DIAGNOSIS — Z7982 Long term (current) use of aspirin: Secondary | ICD-10-CM | POA: Diagnosis not present

## 2014-10-26 DIAGNOSIS — R0689 Other abnormalities of breathing: Secondary | ICD-10-CM | POA: Diagnosis not present

## 2014-10-26 DIAGNOSIS — R0602 Shortness of breath: Secondary | ICD-10-CM | POA: Diagnosis not present

## 2014-10-26 DIAGNOSIS — J9690 Respiratory failure, unspecified, unspecified whether with hypoxia or hypercapnia: Secondary | ICD-10-CM | POA: Diagnosis not present

## 2014-10-26 DIAGNOSIS — S6991XA Unspecified injury of right wrist, hand and finger(s), initial encounter: Secondary | ICD-10-CM | POA: Diagnosis not present

## 2014-10-26 DIAGNOSIS — J441 Chronic obstructive pulmonary disease with (acute) exacerbation: Secondary | ICD-10-CM | POA: Diagnosis not present

## 2014-10-26 DIAGNOSIS — J9622 Acute and chronic respiratory failure with hypercapnia: Secondary | ICD-10-CM | POA: Diagnosis not present

## 2014-10-26 DIAGNOSIS — Z9981 Dependence on supplemental oxygen: Secondary | ICD-10-CM | POA: Diagnosis not present

## 2014-10-26 DIAGNOSIS — J209 Acute bronchitis, unspecified: Secondary | ICD-10-CM | POA: Diagnosis not present

## 2014-10-26 DIAGNOSIS — J9811 Atelectasis: Secondary | ICD-10-CM | POA: Diagnosis not present

## 2014-10-26 DIAGNOSIS — Z7952 Long term (current) use of systemic steroids: Secondary | ICD-10-CM | POA: Diagnosis not present

## 2014-10-26 DIAGNOSIS — Z79899 Other long term (current) drug therapy: Secondary | ICD-10-CM | POA: Diagnosis not present

## 2014-10-26 DIAGNOSIS — J449 Chronic obstructive pulmonary disease, unspecified: Secondary | ICD-10-CM | POA: Diagnosis not present

## 2014-10-26 DIAGNOSIS — I10 Essential (primary) hypertension: Secondary | ICD-10-CM | POA: Diagnosis present

## 2014-10-26 DIAGNOSIS — J440 Chronic obstructive pulmonary disease with acute lower respiratory infection: Secondary | ICD-10-CM | POA: Diagnosis not present

## 2014-10-26 DIAGNOSIS — Z87891 Personal history of nicotine dependence: Secondary | ICD-10-CM | POA: Diagnosis not present

## 2014-10-26 DIAGNOSIS — L03113 Cellulitis of right upper limb: Secondary | ICD-10-CM | POA: Diagnosis not present

## 2014-10-26 DIAGNOSIS — J9620 Acute and chronic respiratory failure, unspecified whether with hypoxia or hypercapnia: Secondary | ICD-10-CM | POA: Diagnosis not present

## 2014-10-26 DIAGNOSIS — N179 Acute kidney failure, unspecified: Secondary | ICD-10-CM | POA: Diagnosis not present

## 2014-10-26 DIAGNOSIS — E038 Other specified hypothyroidism: Secondary | ICD-10-CM | POA: Diagnosis present

## 2014-10-26 DIAGNOSIS — M79641 Pain in right hand: Secondary | ICD-10-CM | POA: Diagnosis not present

## 2014-10-26 DIAGNOSIS — E780 Pure hypercholesterolemia: Secondary | ICD-10-CM | POA: Diagnosis present

## 2014-10-26 DIAGNOSIS — S60221A Contusion of right hand, initial encounter: Secondary | ICD-10-CM | POA: Diagnosis present

## 2014-10-30 DIAGNOSIS — J9690 Respiratory failure, unspecified, unspecified whether with hypoxia or hypercapnia: Secondary | ICD-10-CM | POA: Diagnosis not present

## 2014-11-01 DIAGNOSIS — J441 Chronic obstructive pulmonary disease with (acute) exacerbation: Secondary | ICD-10-CM | POA: Diagnosis not present

## 2014-11-01 DIAGNOSIS — E119 Type 2 diabetes mellitus without complications: Secondary | ICD-10-CM | POA: Diagnosis not present

## 2014-11-01 DIAGNOSIS — I10 Essential (primary) hypertension: Secondary | ICD-10-CM | POA: Diagnosis not present

## 2014-11-01 DIAGNOSIS — Z932 Ileostomy status: Secondary | ICD-10-CM | POA: Diagnosis not present

## 2014-11-04 DIAGNOSIS — J441 Chronic obstructive pulmonary disease with (acute) exacerbation: Secondary | ICD-10-CM | POA: Diagnosis not present

## 2014-11-04 DIAGNOSIS — E119 Type 2 diabetes mellitus without complications: Secondary | ICD-10-CM | POA: Diagnosis not present

## 2014-11-04 DIAGNOSIS — I10 Essential (primary) hypertension: Secondary | ICD-10-CM | POA: Diagnosis not present

## 2014-11-04 DIAGNOSIS — Z932 Ileostomy status: Secondary | ICD-10-CM | POA: Diagnosis not present

## 2014-11-05 DIAGNOSIS — N3091 Cystitis, unspecified with hematuria: Secondary | ICD-10-CM | POA: Diagnosis not present

## 2014-11-05 DIAGNOSIS — M545 Low back pain: Secondary | ICD-10-CM | POA: Diagnosis not present

## 2014-11-06 ENCOUNTER — Inpatient Hospital Stay: Payer: Medicare Other | Admitting: Adult Health

## 2014-11-06 DIAGNOSIS — Z932 Ileostomy status: Secondary | ICD-10-CM | POA: Diagnosis not present

## 2014-11-06 DIAGNOSIS — J441 Chronic obstructive pulmonary disease with (acute) exacerbation: Secondary | ICD-10-CM | POA: Diagnosis not present

## 2014-11-06 DIAGNOSIS — I10 Essential (primary) hypertension: Secondary | ICD-10-CM | POA: Diagnosis not present

## 2014-11-06 DIAGNOSIS — E119 Type 2 diabetes mellitus without complications: Secondary | ICD-10-CM | POA: Diagnosis not present

## 2014-11-10 DIAGNOSIS — M4854XA Collapsed vertebra, not elsewhere classified, thoracic region, initial encounter for fracture: Secondary | ICD-10-CM | POA: Diagnosis not present

## 2014-11-10 DIAGNOSIS — M545 Low back pain: Secondary | ICD-10-CM | POA: Diagnosis not present

## 2014-11-10 DIAGNOSIS — M546 Pain in thoracic spine: Secondary | ICD-10-CM | POA: Diagnosis not present

## 2014-11-10 DIAGNOSIS — S3992XA Unspecified injury of lower back, initial encounter: Secondary | ICD-10-CM | POA: Diagnosis not present

## 2014-11-10 DIAGNOSIS — S299XXA Unspecified injury of thorax, initial encounter: Secondary | ICD-10-CM | POA: Diagnosis not present

## 2014-11-10 DIAGNOSIS — M549 Dorsalgia, unspecified: Secondary | ICD-10-CM | POA: Diagnosis not present

## 2014-11-11 DIAGNOSIS — Z932 Ileostomy status: Secondary | ICD-10-CM | POA: Diagnosis not present

## 2014-11-11 DIAGNOSIS — J441 Chronic obstructive pulmonary disease with (acute) exacerbation: Secondary | ICD-10-CM | POA: Diagnosis not present

## 2014-11-11 DIAGNOSIS — E119 Type 2 diabetes mellitus without complications: Secondary | ICD-10-CM | POA: Diagnosis not present

## 2014-11-11 DIAGNOSIS — M545 Low back pain: Secondary | ICD-10-CM | POA: Diagnosis not present

## 2014-11-11 DIAGNOSIS — I10 Essential (primary) hypertension: Secondary | ICD-10-CM | POA: Diagnosis not present

## 2014-11-12 DIAGNOSIS — Z932 Ileostomy status: Secondary | ICD-10-CM | POA: Diagnosis not present

## 2014-11-12 DIAGNOSIS — E119 Type 2 diabetes mellitus without complications: Secondary | ICD-10-CM | POA: Diagnosis not present

## 2014-11-12 DIAGNOSIS — I10 Essential (primary) hypertension: Secondary | ICD-10-CM | POA: Diagnosis not present

## 2014-11-12 DIAGNOSIS — J441 Chronic obstructive pulmonary disease with (acute) exacerbation: Secondary | ICD-10-CM | POA: Diagnosis not present

## 2014-11-14 ENCOUNTER — Encounter: Payer: Self-pay | Admitting: Adult Health

## 2014-11-14 ENCOUNTER — Ambulatory Visit (INDEPENDENT_AMBULATORY_CARE_PROVIDER_SITE_OTHER): Payer: Medicare Other | Admitting: Adult Health

## 2014-11-14 VITALS — BP 106/64 | HR 86 | Temp 98.0°F | Ht 64.0 in | Wt 173.6 lb

## 2014-11-14 DIAGNOSIS — Z932 Ileostomy status: Secondary | ICD-10-CM | POA: Diagnosis not present

## 2014-11-14 DIAGNOSIS — J9611 Chronic respiratory failure with hypoxia: Secondary | ICD-10-CM | POA: Diagnosis not present

## 2014-11-14 DIAGNOSIS — J441 Chronic obstructive pulmonary disease with (acute) exacerbation: Secondary | ICD-10-CM | POA: Diagnosis not present

## 2014-11-14 DIAGNOSIS — J449 Chronic obstructive pulmonary disease, unspecified: Secondary | ICD-10-CM | POA: Diagnosis not present

## 2014-11-14 DIAGNOSIS — E119 Type 2 diabetes mellitus without complications: Secondary | ICD-10-CM | POA: Diagnosis not present

## 2014-11-14 DIAGNOSIS — I10 Essential (primary) hypertension: Secondary | ICD-10-CM | POA: Diagnosis not present

## 2014-11-14 NOTE — Assessment & Plan Note (Signed)
Compensated on O2  

## 2014-11-14 NOTE — Assessment & Plan Note (Signed)
Compensated on present regimen   Plan  lease continue your Spiriva and Breo for now.  Continue  your prednisone 20mg  daily Continue to follow with ortho for back issues  Follow with Dr Delton CoombesByrum in 2-3  months or sooner if you have any problems.

## 2014-11-14 NOTE — Progress Notes (Signed)
Morgan Hays is a 74 year old woman who follows up today for her COPD, dyspnea, and for a history of a RLL pulmonary nodule. 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 No sgy has been planned due to her significant lung disease. Also with hx depression.   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 Could not tolerate Daliresp. She has been rx with pred + abx x 1 since last visit by PCP. Currently at baseline, O2 is set on 3L/min. She does have some wheezing. Taking loratadine and fluticasone every day.   ROV 07/26/11 -- Severe COPD on O2, chronic cough being rx for allergies and GERD. She is here for regular f/u. She mentions today that she is having some cyanosis, coolness and numbness in her feet for the last 5 -6 months. No w/u done yet, but she is going to mention to her PCP. Breathing has been stable, no flares since last visit. Taking Spiriva + Symbicort + SABA; uses albuterol at least once a day.   ROV 10/31/11 -- Severe COPD on O2, chronic cough being rx for allergies and GERD. Having more nasal sx, gtt, wheezing.  She has not had flare since last time.  Interested in Research scientist (medical).   ROV 02/01/12 -- Severe COPD on O2, chronic cough being rx for allergies and GERD. Since last visit she has undergone Myoview for atypical CP, reassuring study. 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 CT chest > no evidence PE.  Breathing has been stable, still limiting her exertion. Rarely uses SABA.  Spiriva + Symbicort, O2 on 3L/min. She believes that stopping cymbalta has helped her. Some occasional cough.   ROV 06/05/12 -- Severe COPD on O2, chronic cough being rx for allergies and GERD.  CAT score today 23. Spiriva + Symbicort, O2 on 3L/min.  Reports today that her energy is very low, not sure that it is worsening of breathing. She is using albuterol about every day. She had the flu shot. She had prednisone/abx in September. Went to ED in august  after a fall.   ROV 09/21/12 -- Severe COPD on O2, chronic cough being rx for allergies and GERD.  Her breathing is very limiting. She has been rx with pred x 2 since last time.   ROV 10/22/12 -- Severe COPD on O2, chronic cough being rx for allergies and GERD. Returns today reporting worsening dyspnea. We started daily pred a month ago to see if she would benefit. She can tell a difference at rest and w sleeping. No real difference in exertional SOB  ROV 11/21/12 -- Severe COPD, hypoxemia, chronic cough, GERD, allergic chronic rhinitis. We have initiated chronic pred, started tapering last visit, currently 15. Since last time she was evaluated by [name] for increased exertional SOB, overall malaise, was sent for CT chest as below > no PE, no infiltrates, stable emphysema. Also had thyroid studies, showed high TSH and synthroid increased. Her breathing is Ok at rest, worse with exertion.  Using duonebs 2x a day, spiriva + symbicort.   ROV 01/02/13 -- Severe COPD, hypoxemia, chronic cough, GERD, allergic chronic rhinitis. Her chronic pred is at  (increased last time). She doesn't notice any real change in breathing. Cough is stable. She wheezes with exertion.    ROV 02/22/13 -- Severe COPD, hypoxemia, chronic cough, GERD, allergic chronic rhinitis, chronic pred 20. She presents today with about 3 weeks of increased DOE, some increased LE edema. No  change in cough or wheeze. Has to rest with 20 ft. Using duonebs 3-4x a day. O2 on 4L/min pulsed (continuous at home). Her wt is stable.   ROV 03/27/13 -- Severe COPD, hypoxemia, chronic cough, GERD, allergic chronic rhinitis, chronic pred 20. [facility] time we [facility] see if she would benefit.   ROV 06/21/13 -- Severe COPD, hypoxemia, chronic cough, GERD, allergic chronic rhinitis, chronic pred 20. She unfortunately has been dealing with with several problems > anemia, abnormal thyroid testing, has had falls since last time. She is to  see NSGY for newly identified aneurysm. She feel like she loses power in her legs, not really a balance problem. He O2 is set on 3-4L/min. Happens going from seated to standing position. She has had some disorientation, ? Related to pred.   ROV 10/23/13 -- severe COPD, hypoxemia, chronic cough. Chronic pred 20. Seen by [name] 4/1 for mid-back pain. Still happens intermittently.  She is taking benadryl and zyrtec. She continues to have exertional SOB. Also some instability. She is on breo + Spiriva.   ROV 12/13/13 -- severe COPD on pred 20, hypoxemia, chronic cough. She unfortunately experienced a ruptured divertic, required ileostomy. She was readmitted for hypercapneic resp failure and required BiPAP temporarily. She is now back at home.  She wonders about whether she needs BiPAP.   ROV 02/25/14 -- follow up for severe COPD, hypoxemia, chronic cough. She is on Breo + Spiriva. She uses duonebs 1-2x a day. Her O2 is on 3 pulsed w exertion.  Her stable Pred dose is 20mg . She is wondering if she can have her ileostomy reversed - but she acknowledges that it would be high risk. No flares, no new meds. Sx are stable.   ROV 06/03/14 -- Follow-up visit for COPD, interstitial lung disease, hypoxemia. She remains on Breo, Spiriva and prednisone 20 mg daily. She is quite limited with her daily activities, is stable at rest. We talked today about her life expectancy, the risks of CPR or mechanical ventilation. She underscored  her desire to be DO NOT RESUSCITATE.   ROV 10/10/14 -- follow-up visit for hypoxemic respiratory failure in the setting of interstitial lung disease and COPD. She's currently managed on prednisone 20 mg daily as well as Spiriva and Breo. She is doing fairly well. She has not had any hospitalizations, but she did go to urgent care and was treated for sinusitis.    11/14/2014 Post Hospital follow up  Pt returns for a post hospital follow up  [facility] .   Records requested.  Says she is feeling better .  Breathing is at baseline with no flare of cough, dyspnea .  Does have back pain with recently dx compression fx [place] .  Very upset about pain and tx options.  No chest pain, orthopnea, edema or fever Steroid education given .     ROS Constitutional:   No  weight loss, night sweats,  Fevers, chills, +fatigue, or  lassitude.  HEENT:   No headaches,  Difficulty swallowing,  Tooth/dental problems, or  Sore throat,                No sneezing, itching, ear ache, nasal congestion, post nasal drip,   CV:  No chest pain,  Orthopnea, PND, swelling in lower extremities, anasarca, dizziness, palpitations, syncope.   GI  No heartburn, indigestion, abdominal pain, nausea, vomiting, diarrhea, change in bowel habits, loss of appetite,  bloody stools.   Resp:  .  No chest wall deformity  Skin: no rash or lesions.  GU: no dysuria, change in color of urine, no urgency or frequency.  No flank pain, no hematuria     EXAM   Gen: Pleasant, obese, in no distress, elderly and chronically ill appearing   ENT: No lesions,  mouth clear,  oropharynx clear, no postnasal drip  Neck: No JVD, no TMG, no carotid bruits  Lungs: Distant  BS w/ no wheezing, very distant  Cardiovascular: RRR, heart sounds normal, no murmur or gallops, trace edema  Musculoskeletal: No deformities, no cyanosis or clubbing  Neuro: alert, non focal  Skin: Warm, no lesions or rashes   12/19/11 Myoview:  Overall Impression: Normal stress nuclear study with a small, mild, fixed apical defect consistent with thinning; no ischemia.  LV Ejection Fraction: 79%. LV Wall Motion: NL LV Function; NL Wall Motion  CT chest 11/06/12 >>  No PE, centra-lobular emphysema, no infiltrates or nodules, stable thoracic aortic aneurysm

## 2014-11-14 NOTE — Patient Instructions (Addendum)
Please continue your Spiriva and Breo for now.  Continue  your prednisone 20mg  daily Continue to follow with ortho for back issues  Follow with Dr Delton CoombesByrum in 2-3  months or sooner if you have any problems.

## 2014-11-20 DIAGNOSIS — E119 Type 2 diabetes mellitus without complications: Secondary | ICD-10-CM | POA: Diagnosis not present

## 2014-11-20 DIAGNOSIS — I10 Essential (primary) hypertension: Secondary | ICD-10-CM | POA: Diagnosis not present

## 2014-11-20 DIAGNOSIS — Z932 Ileostomy status: Secondary | ICD-10-CM | POA: Diagnosis not present

## 2014-11-20 DIAGNOSIS — J441 Chronic obstructive pulmonary disease with (acute) exacerbation: Secondary | ICD-10-CM | POA: Diagnosis not present

## 2014-11-21 ENCOUNTER — Other Ambulatory Visit: Payer: Self-pay | Admitting: Cardiology

## 2014-11-21 DIAGNOSIS — E119 Type 2 diabetes mellitus without complications: Secondary | ICD-10-CM | POA: Diagnosis not present

## 2014-11-21 DIAGNOSIS — J441 Chronic obstructive pulmonary disease with (acute) exacerbation: Secondary | ICD-10-CM | POA: Diagnosis not present

## 2014-11-21 DIAGNOSIS — I10 Essential (primary) hypertension: Secondary | ICD-10-CM | POA: Diagnosis not present

## 2014-11-21 DIAGNOSIS — E034 Atrophy of thyroid (acquired): Secondary | ICD-10-CM | POA: Diagnosis not present

## 2014-11-21 DIAGNOSIS — Z932 Ileostomy status: Secondary | ICD-10-CM | POA: Diagnosis not present

## 2014-11-22 DIAGNOSIS — I10 Essential (primary) hypertension: Secondary | ICD-10-CM | POA: Diagnosis not present

## 2014-11-22 DIAGNOSIS — J441 Chronic obstructive pulmonary disease with (acute) exacerbation: Secondary | ICD-10-CM | POA: Diagnosis not present

## 2014-11-22 DIAGNOSIS — Z932 Ileostomy status: Secondary | ICD-10-CM | POA: Diagnosis not present

## 2014-11-22 DIAGNOSIS — E119 Type 2 diabetes mellitus without complications: Secondary | ICD-10-CM | POA: Diagnosis not present

## 2014-11-24 DIAGNOSIS — J441 Chronic obstructive pulmonary disease with (acute) exacerbation: Secondary | ICD-10-CM | POA: Diagnosis not present

## 2014-11-24 DIAGNOSIS — E119 Type 2 diabetes mellitus without complications: Secondary | ICD-10-CM | POA: Diagnosis not present

## 2014-11-24 DIAGNOSIS — I10 Essential (primary) hypertension: Secondary | ICD-10-CM | POA: Diagnosis not present

## 2014-11-24 DIAGNOSIS — Z932 Ileostomy status: Secondary | ICD-10-CM | POA: Diagnosis not present

## 2014-11-25 ENCOUNTER — Other Ambulatory Visit: Payer: Self-pay | Admitting: *Deleted

## 2014-11-25 DIAGNOSIS — I10 Essential (primary) hypertension: Secondary | ICD-10-CM | POA: Diagnosis not present

## 2014-11-25 DIAGNOSIS — J449 Chronic obstructive pulmonary disease, unspecified: Secondary | ICD-10-CM

## 2014-11-25 DIAGNOSIS — E119 Type 2 diabetes mellitus without complications: Secondary | ICD-10-CM | POA: Diagnosis not present

## 2014-11-25 DIAGNOSIS — Z932 Ileostomy status: Secondary | ICD-10-CM | POA: Diagnosis not present

## 2014-11-25 DIAGNOSIS — J441 Chronic obstructive pulmonary disease with (acute) exacerbation: Secondary | ICD-10-CM | POA: Diagnosis not present

## 2014-11-25 MED ORDER — TIOTROPIUM BROMIDE MONOHYDRATE 18 MCG IN CAPS: 18.0000 ug | ORAL_CAPSULE | Freq: Every day | RESPIRATORY_TRACT | Status: DC

## 2014-11-26 DIAGNOSIS — M545 Low back pain: Secondary | ICD-10-CM | POA: Diagnosis not present

## 2014-11-26 DIAGNOSIS — D509 Iron deficiency anemia, unspecified: Secondary | ICD-10-CM | POA: Diagnosis not present

## 2014-11-27 DIAGNOSIS — J441 Chronic obstructive pulmonary disease with (acute) exacerbation: Secondary | ICD-10-CM | POA: Diagnosis not present

## 2014-11-27 DIAGNOSIS — E119 Type 2 diabetes mellitus without complications: Secondary | ICD-10-CM | POA: Diagnosis not present

## 2014-11-27 DIAGNOSIS — I10 Essential (primary) hypertension: Secondary | ICD-10-CM | POA: Diagnosis not present

## 2014-11-27 DIAGNOSIS — Z932 Ileostomy status: Secondary | ICD-10-CM | POA: Diagnosis not present

## 2014-12-01 DIAGNOSIS — E119 Type 2 diabetes mellitus without complications: Secondary | ICD-10-CM | POA: Diagnosis not present

## 2014-12-01 DIAGNOSIS — Z932 Ileostomy status: Secondary | ICD-10-CM | POA: Diagnosis not present

## 2014-12-01 DIAGNOSIS — J441 Chronic obstructive pulmonary disease with (acute) exacerbation: Secondary | ICD-10-CM | POA: Diagnosis not present

## 2014-12-01 DIAGNOSIS — I10 Essential (primary) hypertension: Secondary | ICD-10-CM | POA: Diagnosis not present

## 2014-12-02 DIAGNOSIS — Z932 Ileostomy status: Secondary | ICD-10-CM | POA: Diagnosis not present

## 2014-12-02 DIAGNOSIS — J441 Chronic obstructive pulmonary disease with (acute) exacerbation: Secondary | ICD-10-CM | POA: Diagnosis not present

## 2014-12-02 DIAGNOSIS — E119 Type 2 diabetes mellitus without complications: Secondary | ICD-10-CM | POA: Diagnosis not present

## 2014-12-02 DIAGNOSIS — I10 Essential (primary) hypertension: Secondary | ICD-10-CM | POA: Diagnosis not present

## 2014-12-04 DIAGNOSIS — E119 Type 2 diabetes mellitus without complications: Secondary | ICD-10-CM | POA: Diagnosis not present

## 2014-12-04 DIAGNOSIS — Z932 Ileostomy status: Secondary | ICD-10-CM | POA: Diagnosis not present

## 2014-12-04 DIAGNOSIS — I10 Essential (primary) hypertension: Secondary | ICD-10-CM | POA: Diagnosis not present

## 2014-12-04 DIAGNOSIS — J441 Chronic obstructive pulmonary disease with (acute) exacerbation: Secondary | ICD-10-CM | POA: Diagnosis not present

## 2014-12-08 DIAGNOSIS — E119 Type 2 diabetes mellitus without complications: Secondary | ICD-10-CM | POA: Diagnosis not present

## 2014-12-08 DIAGNOSIS — Z932 Ileostomy status: Secondary | ICD-10-CM | POA: Diagnosis not present

## 2014-12-08 DIAGNOSIS — I10 Essential (primary) hypertension: Secondary | ICD-10-CM | POA: Diagnosis not present

## 2014-12-08 DIAGNOSIS — J441 Chronic obstructive pulmonary disease with (acute) exacerbation: Secondary | ICD-10-CM | POA: Diagnosis not present

## 2014-12-09 DIAGNOSIS — E119 Type 2 diabetes mellitus without complications: Secondary | ICD-10-CM | POA: Diagnosis not present

## 2014-12-09 DIAGNOSIS — J441 Chronic obstructive pulmonary disease with (acute) exacerbation: Secondary | ICD-10-CM | POA: Diagnosis not present

## 2014-12-09 DIAGNOSIS — I10 Essential (primary) hypertension: Secondary | ICD-10-CM | POA: Diagnosis not present

## 2014-12-09 DIAGNOSIS — Z932 Ileostomy status: Secondary | ICD-10-CM | POA: Diagnosis not present

## 2014-12-11 DIAGNOSIS — E119 Type 2 diabetes mellitus without complications: Secondary | ICD-10-CM | POA: Diagnosis not present

## 2014-12-11 DIAGNOSIS — Z932 Ileostomy status: Secondary | ICD-10-CM | POA: Diagnosis not present

## 2014-12-11 DIAGNOSIS — I10 Essential (primary) hypertension: Secondary | ICD-10-CM | POA: Diagnosis not present

## 2014-12-11 DIAGNOSIS — J441 Chronic obstructive pulmonary disease with (acute) exacerbation: Secondary | ICD-10-CM | POA: Diagnosis not present

## 2014-12-23 NOTE — Patient Outreach (Signed)
[facility]) Care Management  12/23/2014  Tarra Bowermaster 12/21/1940 [id number]   Referral per High Risk List, assigned Egbert Garibaldi, RN to outreach.  [name]. Sharlee Blew [facility] Care Management [facility] CM Assistant Phone: [phone] Fax: [phone]

## 2014-12-25 ENCOUNTER — Other Ambulatory Visit: Payer: Self-pay | Admitting: *Deleted

## 2014-12-25 NOTE — Patient Outreach (Signed)
[facility]) Care Management  12/25/2014  Morgan Hays May 22, 1941 [id number]   Telephone call to patient  discussed and offered [facility] Care Management services. Patient agrees to [facility] services. .Reassured that she would not be billed for this service and that it would not interfere with the company that she gets her ileostomy supplies from.  Subjective : Patient discussed recent hospitalization for severe COPD problems, states that she  wears continuous home oxygen, history of diabetes states that she is supposed to check blood sugar on a daily basis but she doesn't. She just recently completed home health RN services with [facility]. Mrs. Wiggin states that she doesn't go outside the home much except for appointments, she uses a walker in the home. Patient states her daughter provides transportation but at times it gets difficult because she may not be available.  Assessment : Patient would benefit from COPD education and management, Patient needs resources for transportation services to appointments at times when family is not available.  Plan : Will refer to community nurse for COPD education , management and support.  Egbert Garibaldi, RN, [facility] Care Management [phone]

## 2014-12-29 DIAGNOSIS — J449 Chronic obstructive pulmonary disease, unspecified: Secondary | ICD-10-CM | POA: Diagnosis not present

## 2014-12-29 DIAGNOSIS — E559 Vitamin D deficiency, unspecified: Secondary | ICD-10-CM | POA: Diagnosis not present

## 2014-12-29 DIAGNOSIS — E119 Type 2 diabetes mellitus without complications: Secondary | ICD-10-CM | POA: Diagnosis not present

## 2014-12-29 DIAGNOSIS — E034 Atrophy of thyroid (acquired): Secondary | ICD-10-CM | POA: Diagnosis not present

## 2014-12-29 DIAGNOSIS — E782 Mixed hyperlipidemia: Secondary | ICD-10-CM | POA: Diagnosis not present

## 2014-12-29 DIAGNOSIS — I10 Essential (primary) hypertension: Secondary | ICD-10-CM | POA: Diagnosis not present

## 2014-12-29 DIAGNOSIS — M545 Low back pain: Secondary | ICD-10-CM | POA: Diagnosis not present

## 2014-12-29 DIAGNOSIS — E1140 Type 2 diabetes mellitus with diabetic neuropathy, unspecified: Secondary | ICD-10-CM | POA: Diagnosis not present

## 2014-12-30 ENCOUNTER — Telehealth: Payer: Self-pay | Admitting: Adult Health

## 2014-12-30 NOTE — Telephone Encounter (Signed)
[facility]

## 2014-12-31 ENCOUNTER — Encounter: Payer: Self-pay | Admitting: *Deleted

## 2014-12-31 ENCOUNTER — Other Ambulatory Visit: Payer: Self-pay | Admitting: *Deleted

## 2014-12-31 NOTE — Progress Notes (Signed)
HPI: FU thoracic aneurysm, severe COPD, CAD and paroxysmal atrial tachycardia. She had an echocardiogram in Nov 2010, that showed normal LV function, grade 1 diastolic dysfunction, aortic aneurysm, mild AI and mild to moderate MR. Last Myoview was performed in June of 2013. Her ejection fraction was 79% with apical thinning and no ischemia. Chest CT February 2016 showed ascending aortic aneurysm of 53 mm and descending thoracic aortic aneurysm of 42 cm. She has elected conservative therapy. She is followed by Dr. Tyrone Sage for her aneurysm. Since last seen, she continues to have dyspnea on exertion secondary to COPD. No orthopnea, PND, pedal edema, chest pain or syncope.  Current Outpatient Prescriptions  Medication Sig Dispense Refill  . albuterol (PROVENTIL HFA;VENTOLIN HFA) 108 (90 BASE) MCG/ACT inhaler Inhale 1-2 puffs into the lungs every 6 (six) hours as needed for wheezing or shortness of breath. 1 Inhaler 5  . aspirin 81 MG tablet Take 81 mg by mouth daily.      . butalbital-acetaminophen-caffeine (FIORICET WITH CODEINE) 50-325-40-30 MG per capsule Take 1 capsule by mouth every 6 (six) hours as needed.      . Calcium Carbonate (CALCIUM 500 PO) Take 1 capsule by mouth 2 (two) times daily.      . Cetirizine HCl (ZYRTEC ALLERGY) 10 MG CAPS Take 1 capsule by mouth daily.    . Cholecalciferol (VITAMIN D) 1000 UNITS capsule Take 1,000 Units by mouth daily.      . diphenhydrAMINE (SOMINEX) 25 MG tablet Take 25 mg by mouth at bedtime as needed for sleep.    . fluticasone (FLONASE) 50 MCG/ACT nasal spray Place 2 sprays into both nostrils daily. 16 g 1  . Fluticasone Furoate-Vilanterol (BREO ELLIPTA) 100-25 MCG/INH AEPB Inhale 1 puff into the lungs daily. 60 each 6  . gabapentin (NEURONTIN) 100 MG capsule Take 100 mg by mouth 2 (two) times daily.    [name] ibuprofen (ADVIL,MOTRIN) 200 MG tablet Take 200 mg by mouth every 6 (six) hours as needed.    [name] ipratropium-albuterol (DUONEB) 0.5-2.5 (3)  MG/3ML SOLN Take 3 mLs by nebulization every 6 (six) hours as needed.    [name] levothyroxine (SYNTHROID, LEVOTHROID) 175 MCG tablet Take 175 mcg by mouth daily before breakfast.    . losartan (COZAAR) 100 MG tablet Take 100 mg by mouth daily.    . magnesium oxide (MAG-OX) 400 MG tablet Take 400 mg by mouth daily.      . metFORMIN (GLUCOPHAGE-XR) 500 MG 24 hr tablet 1 tablet daily.    [name] omeprazole (PRILOSEC) 20 MG capsule Take 1 capsule (20 mg total) by mouth daily. 30 capsule 6  . pravastatin (PRAVACHOL) 40 MG tablet TAKE 1 TABLET ONCE DAILY. 30 tablet 1  . predniSONE (DELTASONE) 20 MG tablet Take 1 tablet (20 mg total) by mouth daily. 30 tablet 1  . sertraline (ZOLOFT) 50 MG tablet Take 50 mg by mouth 2 (two) times daily. One tab in am and pm    . Spacer/Aero-Holding Chambers (AEROCHAMBER PLUS FLO-VU LARGE) MISC 1 each by Other route once. 1 each 0  . tiotropium (SPIRIVA) 18 MCG inhalation capsule Place 1 capsule (18 mcg total) into inhaler and inhale daily. 30 capsule 5   No current facility-administered medications for this visit.     Past Medical History  Diagnosis Date  . Supraventricular tachycardia   . MVP (mitral valve prolapse)   . Hypothyroid   . Hypertension   . Aortic aneurysm   . Pulmonary nodule   .  Varicose vein   . Arthritis   . Depression   . COPD (chronic obstructive pulmonary disease)   . CAD (coronary artery disease)   . Allergy   . Anemia   . Anxiety   . Blood transfusion without reported diagnosis   . Diabetes mellitus without complication   . Cataract     Past Surgical History  Procedure Laterality Date  . Appendectomy  1956  . Total abdominal hysterectomy  1975  . Tubal ligation  1972  . Breast surgery      benign left breast cyst removed    History   Social History  . Marital Status: Married    Spouse Name: N/A  . Number of Children: N/A  . Years of Education: N/A   Occupational History  . LPN    Social History Main Topics  . Smoking  status: Former Smoker -- 1.00 packs/day for 50 years    Types: Cigarettes    Quit date: 07/05/1999  . Smokeless tobacco: Never Used  . Alcohol Use: No  . Drug Use: No  . Sexual Activity: No   Other Topics Concern  . Not on file   Social History Narrative   Pt is married and lives with her husband. She works as an Public house managerLPN.  She has not had any known TB exposures.  She is a former smoker.  Quit in 2001.  She has approx 50 pack year total history.  Does not use alcohol     ROS: no fevers or chills, productive cough, hemoptysis, dysphasia, odynophagia, melena, hematochezia, dysuria, hematuria, rash, seizure activity, orthopnea, PND, pedal edema, claudication. Remaining systems are negative.  Physical Exam: Well-developed well-nourished in no acute distress.  Skin is warm and dry.  HEENT is normal.  Neck is supple.  Chest diminished breath sounds throughout Cardiovascular exam is regular rate and rhythm.  Abdominal exam nontender or distended. No masses palpated. Extremities show no edema. neuro grossly intact  ECG sinus rhythm at a rate of 82. RV conduction delay.

## 2014-12-31 NOTE — Patient Outreach (Addendum)
[facility] Care Management  12/31/2014  Humberto LeepJudy Swalley 03/14/1941 [id number]   Initial Telephone Outreach  Subjective : " Slow start the morning due to back pain"  Assessment : Patient reports resting in bed at present, has recently taken her pain medication for her back pain. Reports breathing is ok so far today.Mrs.Westhoff states that her bed is in the living room at present, and that she is able to ambulate to bathroom to care for her ileostomy.  Patient voiced no new concerns at present.  Plan: Follow up telephone visit on next week.  Egbert GaribaldiKimberly Glover, RN, [facility] Care Management [phone]

## 2015-01-02 ENCOUNTER — Ambulatory Visit (INDEPENDENT_AMBULATORY_CARE_PROVIDER_SITE_OTHER): Payer: Medicare Other | Admitting: Cardiology

## 2015-01-02 ENCOUNTER — Encounter: Payer: Self-pay | Admitting: Cardiology

## 2015-01-02 VITALS — BP 118/82 | HR 82 | Ht 60.0 in | Wt 176.0 lb

## 2015-01-02 DIAGNOSIS — I719 Aortic aneurysm of unspecified site, without rupture: Secondary | ICD-10-CM | POA: Diagnosis not present

## 2015-01-02 DIAGNOSIS — I2510 Atherosclerotic heart disease of native coronary artery without angina pectoris: Secondary | ICD-10-CM

## 2015-01-02 DIAGNOSIS — I059 Rheumatic mitral valve disease, unspecified: Secondary | ICD-10-CM | POA: Diagnosis not present

## 2015-01-02 DIAGNOSIS — I10 Essential (primary) hypertension: Secondary | ICD-10-CM

## 2015-01-02 NOTE — Patient Instructions (Signed)
Your physician wants you to follow-up in: ONE YEAR WITH DR CRENSHAW You will receive a reminder letter in the mail two months in advance. If you don't receive a letter, please call our office to schedule the follow-up appointment.  

## 2015-01-02 NOTE — Assessment & Plan Note (Signed)
No recent episodes

## 2015-01-02 NOTE — Assessment & Plan Note (Signed)
Followed by Dr. Tyrone SageGerhardt. Doubt she would ever be a candidate for aortic root replacement given severity of COPD. She is considering not continuing with CT scans.

## 2015-01-02 NOTE — Assessment & Plan Note (Signed)
Continue statin. 

## 2015-01-02 NOTE — Assessment & Plan Note (Signed)
Continue aspirin and statin. 

## 2015-01-02 NOTE — Assessment & Plan Note (Signed)
Blood pressure controlled. Continue present medications. 

## 2015-01-08 ENCOUNTER — Other Ambulatory Visit: Payer: Self-pay | Admitting: *Deleted

## 2015-01-08 NOTE — Patient Outreach (Signed)
[facility] Care Management  01/08/2015  Humberto LeepJudy Pakula 09/29/1940 [id number]   Scheduled telephone outreach call.  Subjective: Mrs. Andrey CampanileWilson reports she has only been out of bed a little while this morning. Complaints of her chronic back pain,states she has taken her medication as prescribed "it just takes a little while to kick in". Reports that she has planned follow up with her orthopedic Dr.for back issues.  Reports respiratory status is at baseline and no flares in increase of cough or shortness of breath,continues with Oxygen at 3 liters continuously.  Denies any new concerns at this time, reviewed my contact information to call RN   for concerns or MD and 911 for emergencies.  Plan: Initial outreach home visit scheduled for July 26 at 11:00.  Egbert GaribaldiKimberly Glover, RN, [facility] Care Management [phone]

## 2015-01-22 ENCOUNTER — Other Ambulatory Visit: Payer: Self-pay | Admitting: Cardiology

## 2015-01-26 ENCOUNTER — Encounter: Payer: Self-pay | Admitting: Emergency Medicine

## 2015-01-26 ENCOUNTER — Ambulatory Visit (INDEPENDENT_AMBULATORY_CARE_PROVIDER_SITE_OTHER): Payer: Medicare Other | Admitting: Emergency Medicine

## 2015-01-26 VITALS — BP 132/60 | HR 92 | Wt 172.8 lb

## 2015-01-26 DIAGNOSIS — I2510 Atherosclerotic heart disease of native coronary artery without angina pectoris: Secondary | ICD-10-CM | POA: Diagnosis not present

## 2015-01-26 DIAGNOSIS — J449 Chronic obstructive pulmonary disease, unspecified: Secondary | ICD-10-CM | POA: Diagnosis not present

## 2015-01-26 NOTE — Patient Instructions (Signed)
Please continue your inhaled medications as you have been taking them  Wear your oxygen at all times 3 -4 L /min Get your epidural injection as planned.  Follow with Dr Delton Coombes in 2 months or sooner if you have any problems.

## 2015-01-26 NOTE — Progress Notes (Signed)
Morgan Hays is a 74 year old woman who follows up today for her COPD, dyspnea, and for a history of a RLL pulmonary nodule. 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 No sgy has been planned due to her significant lung disease. Also with hx depression.   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 Could not tolerate Daliresp. She has been rx with pred + abx x 1 since last visit by PCP. Currently at baseline, O2 is set on 3L/min. She does have some wheezing. Taking loratadine and fluticasone every day.   ROV 07/26/11 -- Severe COPD on O2, chronic cough being rx for allergies and GERD. She is here for regular f/u. She mentions today that she is having some cyanosis, coolness and numbness in her feet for the last 5 -6 months. No w/u done yet, but she is going to mention to her PCP. Breathing has been stable, no flares since last visit. Taking Spiriva + Symbicort + SABA; uses albuterol at least once a day.   ROV 10/31/11 -- Severe COPD on O2, chronic cough being rx for allergies and GERD. Having more nasal sx, gtt, wheezing.  She has not had flare since last time.  Interested in Research scientist (medical).   ROV 02/01/12 -- Severe COPD on O2, chronic cough being rx for allergies and GERD. Since last visit she has undergone Myoview for atypical CP, reassuring study. 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 CT chest > no evidence PE.  Breathing has been stable, still limiting her exertion. Rarely uses SABA.  Spiriva + Symbicort, O2 on 3L/min. She believes that stopping cymbalta has helped her. Some occasional cough.   ROV 06/05/12 -- Severe COPD on O2, chronic cough being rx for allergies and GERD.  CAT score today 23. Spiriva + Symbicort, O2 on 3L/min.  Reports today that her energy is very low, not sure that it is worsening of breathing. She is using albuterol about every day. She had the flu shot. She had prednisone/abx in September. Went to ED in august  after a fall.   ROV 09/21/12 -- Severe COPD on O2, chronic cough being rx for allergies and GERD.  Her breathing is very limiting. She has been rx with pred x 2 since last time.   ROV 10/22/12 -- Severe COPD on O2, chronic cough being rx for allergies and GERD. Returns today reporting worsening dyspnea. We started daily pred a month ago to see if she would benefit. She can tell a difference at rest and w sleeping. No real difference in exertional SOB  ROV 11/21/12 -- Severe COPD, hypoxemia, chronic cough, GERD, allergic chronic rhinitis. We have initiated chronic pred, started tapering last visit, currently 15. Since last time she was evaluated by [name] for increased exertional SOB, overall malaise, was sent for CT chest as below > no PE, no infiltrates, stable emphysema. Also had thyroid studies, showed high TSH and synthroid increased. Her breathing is Ok at rest, worse with exertion.  Using duonebs 2x a day, spiriva + symbicort.   ROV 01/02/13 -- Severe COPD, hypoxemia, chronic cough, GERD, allergic chronic rhinitis. Her chronic pred is at  (increased last time). She doesn't notice any real change in breathing. Cough is stable. She wheezes with exertion.    ROV 02/22/13 -- Severe COPD, hypoxemia, chronic cough, GERD, allergic chronic rhinitis, chronic pred 20. She presents today with about 3 weeks of increased DOE, some increased LE edema. No  change in cough or wheeze. Has to rest with 20 ft. Using duonebs 3-4x a day. O2 on 4L/min pulsed (continuous at home). Her wt is stable.   ROV 03/27/13 -- Severe COPD, hypoxemia, chronic cough, GERD, allergic chronic rhinitis, chronic pred 20. Last time we tried changing symbicort to [facility] to see if she would benefit.   ROV 06/21/13 -- Severe COPD, hypoxemia, chronic cough, GERD, allergic chronic rhinitis, chronic pred 20. She unfortunately has been dealing with with several problems > anemia, abnormal thyroid testing, has had falls since last time. She is to  see NSGY for newly identified aneurysm. She feel like she loses power in her legs, not really a balance problem. He O2 is set on 3-4L/min. Happens going from seated to standing position. She has had some disorientation, ? Related to pred.   ROV 10/23/13 -- severe COPD, hypoxemia, chronic cough. Chronic pred 20. Seen by Dr Sherene Sires 4/1 for mid-back pain. Still happens intermittently.  She is taking benadryl and zyrtec. She continues to have exertional SOB. Also some instability. She is on breo + Spiriva.   ROV 12/13/13 -- severe COPD on pred 20, hypoxemia, chronic cough. She unfortunately experienced a ruptured divertic, required ileostomy. She was readmitted for hypercapneic resp failure and required BiPAP temporarily. She is now back at home.  She wonders about whether she needs BiPAP.   ROV 02/25/14 -- follow up for severe COPD, hypoxemia, chronic cough. She is on Breo + Spiriva. She uses duonebs 1-2x a day. Her O2 is on 3 pulsed w exertion.  Her stable Pred dose is 20mg . She is wondering if she can have her ileostomy reversed - but she acknowledges that it would be high risk. No flares, no new meds. Sx are stable.   ROV 06/03/14 -- Follow-up visit for COPD, interstitial lung disease, hypoxemia. She remains on Breo, Spiriva and prednisone 20 mg daily. She is quite limited with her daily activities, is stable at rest. We talked today about her life expectancy, the risks of CPR or mechanical ventilation. She underscored  her desire to be DO NOT RESUSCITATE.   ROV 10/10/14 -- follow-up visit for hypoxemic respiratory failure in the setting of interstitial lung disease and COPD. She's currently managed on prednisone 20 mg daily as well as Spiriva and Breo. She is doing fairly well. She has not had any hospitalizations, but she did go to urgent care and was treated for sinusitis.    Post Hospital follow up 11/09/14 Pt returns for a post hospital follow up  Recently admitted to [facility] ? Reason .  Records  requested.  Says she is feeling better .  Breathing is at baseline with no flare of cough, dyspnea .  Does have back pain with recently dx compression fx Seeing ortho in [place] .  Very upset about pain and tx options.  No chest pain, orthopnea, edema or fever Steroid education given .    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 She was hospitalized in April for AE-COPD, hypercapnia.  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 She is supposed to have an epidural injection soon. Wearing 3-4L/min. Using breo and spiriva, SABA prn.       EXAM  Filed Vitals:   01/26/15 1332  BP: 132/60  Pulse: 92  Weight: 172 lb 12.8 oz (78.382 kg)  SpO2: 90%  Gen: Pleasant, obese, in no distress, elderly and chronically ill appearing   ENT: No lesions,  mouth clear,  oropharynx clear, no postnasal drip  Neck: No JVD, no TMG, no carotid bruits  Lungs: Distant  BS w/ no wheezing, very distant  Cardiovascular: RRR, heart sounds normal, no murmur or gallops, trace edema  Musculoskeletal: No deformities, no cyanosis or clubbing  Neuro: alert, non focal  Skin: Warm, no lesions or rashes   12/19/11 Myoview:  Overall Impression: Normal stress nuclear study with a small, mild, fixed apical defect consistent with thinning; no ischemia.  LV Ejection Fraction: 79%. LV Wall Motion: NL LV Function; NL Wall Motion  CT chest 11/06/12 >>  No PE, centra-lobular emphysema, no infiltrates or nodules, stable thoracic aortic aneurysm   COPD (chronic obstructive pulmonary disease) Very debilitated now, GOLD D disease. No AE since last time but she has not recovered any functional capacity since hosp in April. We need to consider possible cardiopulmonary rehabilitation after she works to get her back pain under control. She is planned for an epidural injection next week. We will continue her  current medications and oxygen. Follow-up in 2 months.

## 2015-01-26 NOTE — Assessment & Plan Note (Signed)
Very debilitated now, GOLD D disease. No AE since last time but she has not recovered any functional capacity since hosp in April. We need to consider possible cardiopulmonary rehabilitation after she works to get her back pain under control. She is planned for an epidural injection next week. We will continue her current medications and oxygen. Follow-up in 2 months.

## 2015-01-27 ENCOUNTER — Encounter: Payer: Self-pay | Admitting: *Deleted

## 2015-01-27 ENCOUNTER — Other Ambulatory Visit: Payer: Self-pay | Admitting: *Deleted

## 2015-01-27 VITALS — BP 140/80 | HR 83 | Resp 22

## 2015-01-27 DIAGNOSIS — J441 Chronic obstructive pulmonary disease with (acute) exacerbation: Secondary | ICD-10-CM

## 2015-01-27 DIAGNOSIS — Z748 Other problems related to care provider dependency: Secondary | ICD-10-CM

## 2015-01-27 NOTE — Patient Outreach (Signed)
[facility]) Care Management   01/27/2015  Morgan Hays 04/27/1941 [id number]  Morgan Hays is an 74 y.o. female    Initial Home Visit  Subjective: Morgan Hays reports doing "ok" today, discussed   her recent office visit to her pulmonologist. Patient states that she is scheduled to have an epidural  Injection for her back discomfort on 7/27, states she is not looking forward to it, but hopes that it will help her back pain.Morgan Hays voiced some concern regarding  transportation to  appointments, at times her daughter takes her and at other times her sister in law, sometimes it is inconvenient for them, states if she had transportation to appointments in [place] then she could use family for out of town appointments .  Objective:   Review of Systems  Constitutional: Negative.   HENT: Negative.   Eyes: Negative.   Respiratory:       Denies increase in shortness of breath or cough  Cardiovascular: Negative.   Genitourinary: Negative.   Musculoskeletal: Positive for back pain.  Skin: Negative.   Neurological: Negative.   Psychiatric/Behavioral: Negative.     Physical Exam  Constitutional: She is oriented to person, place, and time. She appears well-developed.  Eyes: Pupils are equal, round, and reactive to light.  Cardiovascular: Normal rate.   Respiratory:  Decreased breath sounds  GI:  Ileostomy bag in place  Musculoskeletal:  Chronic back pain  Neurological: She is oriented to person, place, and time.  Skin: Skin is warm and dry.  Psychiatric: She has a normal mood and affect. Her behavior is normal. Judgment and thought content normal.   BP 140/80 mmHg  Pulse 83  Resp 22  SpO2 98% Current Medications:   Current Outpatient Prescriptions  Medication Sig Dispense Refill  . albuterol (PROVENTIL HFA;VENTOLIN HFA) 108 (90 BASE) MCG/ACT inhaler Inhale 1-2 puffs into the lungs every 6 (six) hours as needed for wheezing or shortness of breath. 1 Inhaler 5  .  aspirin 81 MG tablet Take 81 mg by mouth daily.      . butalbital-acetaminophen-caffeine (FIORICET WITH CODEINE) 50-325-40-30 MG per capsule Take 1 capsule by mouth every 6 (six) hours as needed.      . Calcium Carbonate (CALCIUM 500 PO) Take 1 capsule by mouth daily.     . calcium carbonate (OS-CAL) 600 MG TABS tablet Take 600 mg by mouth daily with breakfast.    . Cetirizine HCl (ZYRTEC ALLERGY) 10 MG CAPS Take 1 capsule by mouth daily.    . Cholecalciferol (VITAMIN D) 1000 UNITS capsule Take 1,000 Units by mouth daily.      . diphenhydrAMINE (SOMINEX) 25 MG tablet Take 25 mg by mouth at bedtime as needed for sleep.    . fluticasone (FLONASE) 50 MCG/ACT nasal spray Place 2 sprays into both nostrils daily. 16 g 1  . Fluticasone Furoate-Vilanterol (BREO ELLIPTA) 100-25 MCG/INH AEPB Inhale 1 puff into the lungs daily. 60 each 6  . gabapentin (NEURONTIN) 100 MG capsule Take 100 mg by mouth 2 (two) times daily.    [name] ipratropium-albuterol (DUONEB) 0.5-2.5 (3) MG/3ML SOLN Take 3 mLs by nebulization every 6 (six) hours as needed.    . Iron-FA-B Cmp-C-Biot-Probiotic (FUSION PLUS) CAPS Take 1 capsule by mouth daily.    [name] levothyroxine (SYNTHROID, LEVOTHROID) 175 MCG tablet Take 137 mcg by mouth daily before breakfast.     . losartan (COZAAR) 100 MG tablet Take 100 mg by mouth daily.    . magnesium oxide (MAG-OX) 400  MG tablet Take 400 mg by mouth daily.      . metFORMIN (GLUCOPHAGE-XR) 500 MG 24 hr tablet 1 tablet daily.    [name] omeprazole (PRILOSEC) 20 MG capsule Take 1 capsule (20 mg total) by mouth daily. 30 capsule 6  . Oxycodone-Acetaminophen (PERCOCET PO) Take by mouth every 8 (eight) hours as needed.     . pravastatin (PRAVACHOL) 40 MG tablet TAKE 1 TABLET ONCE DAILY. 30 tablet 11  . predniSONE (DELTASONE) 20 MG tablet Take 1 tablet (20 mg total) by mouth daily. 30 tablet 1  . sertraline (ZOLOFT) 50 MG tablet Take 50 mg by mouth 2 (two) times daily. One tab in am and pm    . tiotropium (SPIRIVA) 18  MCG inhalation capsule Place 1 capsule (18 mcg total) into inhaler and inhale daily. 30 capsule 5  . ibuprofen (ADVIL,MOTRIN) 200 MG tablet Take 200 mg by mouth every 6 (six) hours as needed.    [name] Spacer/Aero-Holding Chambers (AEROCHAMBER PLUS FLO-VU LARGE) MISC 1 each by Other route once. (Patient not taking: Reported on 01/26/2015) 1 each 0   No current facility-administered medications for this visit.    Functional Status:   In your present state of health, do you have any difficulty performing the following activities: 12/31/2014  Hearing? N  Vision? N  Difficulty concentrating or making decisions? Y  Walking or climbing stairs? Y  Dressing or bathing? N  Doing errands, shopping? Y  Preparing Food and eating ? N  Using the Toilet? N  In the past six months, have you accidently leaked urine? Y  Do you have problems with loss of bowel control? Y  Managing your Medications? N  Managing your Finances? N  Housekeeping or managing your Housekeeping? 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 Denies increase in shortness of breath or cough, complaint of back pain and states that she has already taken her percocet that reports gives her some relief.[name] reports taking her scheduled medication for this am, and managing her medication without problems taking medication as prescribed, she uses a weekly pill box for medication. Morgan Hays voiced concern regarding cost of Memory [facility] and Morgan Hays states that she is able to get some from her doctors office.  Mrs Hays uses her rolling walker in the home as well as  her electric wheelchair, her hospital bed is set up in the living room, she wears her safety alert button necklace at all times.Morgan Hays reports managing her ileostomy that she empties and  changes the bag several times during the day.  Morgan Hays discussed her recent Hemoglobin A1c result of 6.9 reports that she has the needed supplies to check her blood sugar but does not do it on a regular basis, but states she will begin to record blood sugar as well as daily weights and record in [facility] calendar book.   Plan: Will consult social worker to assist with [facility] transportation sign up Will consult pharmacy regarding patient concern regarding cost of medication Will send involvement letter to Darrol Jump, [place] as well as Initial visit note Schedule follow up call on August 9 at 11:30 to follow after back procedure      [facility] CM Care Plan Problem One  Patient Outreach from 01/27/2015 in [place] Problem One  Recent COPD  exacerbation [place] for Problem One  Active   [facility] Long Term Goal (31-90 days)   Patient will not experience Hospital admission in the next 31 days   [place] Term Goal Start Date  12/31/14   Interventions for Problem One Long Term Goal  Reviewed with patient how to use [facility] blue calendar to keep up with appointments   [facility] CM Short Term Goal #1 (0-30 days)  Patient will be able to report actions to take in the yellow zone    [facility] CM Short Term Goal #1 Start Date  12/31/14   [facility] CM Short Term Goal #1 Met Date  01/27/15   [facility] CM Short Term Goal #2 (0-30 days)  Patient will be able to teach back Green , Yellow and Red COPD zones  in the next 30 days    [facility] CM Short Term Goal #2 Start Date  12/31/14   Interventions for Short Term Goal #2  Reviewed zones, copd action plan magnet on refrigerator    Joylene Draft, [place], [place] Care Management [phone]

## 2015-01-27 NOTE — Patient Outreach (Signed)
[facility]) Care Management  01/27/2015  Morgan Hays [birth year] [id number]   Request from Egbert Garibaldi, RN to assign SW and Pharmacy, assigned Patterson Tract, LCSW and Steve Rattler, PharmD.  [name]. Sharlee Blew [facility] Care Management [facility] CM Assistant Phone: [phone] Fax: [phone]

## 2015-01-29 ENCOUNTER — Other Ambulatory Visit: Payer: Self-pay | Admitting: *Deleted

## 2015-01-29 NOTE — Patient Outreach (Signed)
[facility]) Care Management  01/29/2015  Cincere Deprey 1941/04/27 [id number]  Referral from [facility]  to provide community resources for transportation.  Per patient, her daughter and sister in law usually provide the  transportation to her doctor's appointments, however she would like a back up plan.  Contact information given to patient for [facility]  [phone] option (1) to enter her demographic information into the system.  Once her information  is entered, patient would need to call 2-3 days ahead of time to schedule the ride. Cost: each way $ 2.00 within the city limits $3.00 outside city limits $5.00 out of the county  It was explained to patient that [facility] goes to [employer] on Mondays, [place] on Tuesdays, [place] on [place]nesdays, [place] on Thursdays and [place] on Fridays so to keep this schedule in mind when scheduling her medical appointments and coordinating rides.    Per patient no further social work needs at this time. Case to be closed to social work.   Adriana Reams [facility] Care Management [phone]

## 2015-02-10 ENCOUNTER — Other Ambulatory Visit: Payer: Self-pay | Admitting: *Deleted

## 2015-02-10 ENCOUNTER — Other Ambulatory Visit: Payer: Self-pay

## 2015-02-10 NOTE — Patient Outreach (Signed)
[facility]) Care Management  02/10/2015  Niharika Savino 1940/09/03 [id number]   Outreach telephone call Subjective: Morgan Hays states that this is a bad day for her, complaint of having less energy and continued back pain,she denies increase in cough or sputum production or being more short of breath. She reported that she has taken her daily medication, remains on continuous oxygen, has taken her nebulizer treatment this am. Reviewed her action plan for when having a bad day, Mrs Larsen is able to state actions that she will take. Mrs. Rada voiced being disappointed that she was not able to have the epidural injection for her back, stating because of the daily medications that she takes including prednisone. She states that she was started on a pain patch Flector, but that she is not getting much relief from it, and she is concerned regarding the cost of the medication $155 for one month supply that her friend paid for her. Patient states [facility] pharmacist called her this am and told her about the concern. MorganRiedlinger reports that she is still taking oxycodone as needed with some relief.  Plan: Instructed Mrs. Rogala to follow action plan for having a bad day, reviewed my contact information to call for concerns, MD office if not improving, and 911 for emergency. Will follow up with patient on 8/10.  Egbert Garibaldi, RN, [facility] Care Management [phone]

## 2015-02-10 NOTE — Patient Outreach (Signed)
I called Morgan Hays to discuss assistance with [name] and Spiriva.  She stated she is not worried about those right now but rather her new pain patch, [name].  I asked if she wanted to discuss options and she stated she did not feel good right now and asked if I could call her back.  I asked if I could call her tomorrow and she stated that would be fine.  I will also complete a medication review after I clarify all her medications tomorrow.    Steve Rattler, PharmD, [employer] [facility] Environmental consultant [phone]

## 2015-02-11 ENCOUNTER — Other Ambulatory Visit: Payer: Self-pay | Admitting: *Deleted

## 2015-02-11 ENCOUNTER — Ambulatory Visit: Payer: Medicare Other | Admitting: *Deleted

## 2015-02-11 NOTE — Patient Outreach (Signed)
[facility]) Care Management  02/11/2015  Morgan Hays [birth year] [id number]   Telephone outreach call to [name] to follow up with how she is feeling today, compared to yesterday being a "bad day", [name] states that she is feeling better today, breathing is at her usual state. She voiced concern regarding her chronic back pain, her upcoming appointment with her pain management doctor. She reports that she is still taking her as needed percocet and daily pain patch.  Next scheduled home visit for August 24 at 11 AM.   Egbert Garibaldi, RN, [facility] Care Management [phone]

## 2015-02-13 ENCOUNTER — Other Ambulatory Visit: Payer: Self-pay

## 2015-02-13 NOTE — Patient Outreach (Signed)
I called Ms. Stefano to discuss assisting her to obtain her medications.  She stated she had company and asked if I could call her back next week.  I will call her on Monday, February 16, 2015.  This is my second attempt to talk to her.    Steve Rattler, PharmD, [employer] [facility] Environmental consultant [phone]

## 2015-02-16 ENCOUNTER — Other Ambulatory Visit: Payer: Self-pay

## 2015-02-16 NOTE — Patient Outreach (Signed)
I called Morgan Hays to see if I could assist with her affording her medications.  Her biggest concern is her Flector patches.  She has to pay $155 for them each month.  I asked if she had ever applied for Extra Help and she stated she had not.  I asked if she was willing to apply over the phone and she stated she was.  I completed the application over the phone with her.  She should hear in the next 2 to 4 weeks if she qualifies.  I asked her to call me when she hears.  She stated she would.  If I do not hear back from her in the next 4 weeks, I will call her.  Flector does not have a patient assistance program.    Steve Rattler, PharmD, [employer] [facility] Environmental consultant [phone]

## 2015-02-25 ENCOUNTER — Other Ambulatory Visit: Payer: Self-pay | Admitting: *Deleted

## 2015-02-25 NOTE — Patient Outreach (Signed)
[facility]) Care Management   02/25/2015  Morgan Hays February 26, 1941 [id number]  Morgan Hays is an 74 y.o. female  Subjective: Morgan Hays discussed some improvement in her back discomfort,states that she is continuing to [place] the flector patch and her percocet as needed. Morgan Hays reports her breathing is good,,my usual today,because the humidity outside is down, denies increased cough, sputum or shortness of breath.  Objective:   Review of Systems  HENT: Negative.   Respiratory: Negative for cough.   Cardiovascular: Negative.  Negative for chest pain.  Genitourinary: Negative.   Musculoskeletal: Positive for back pain. Negative for falls.  Skin: Negative.   Neurological: Negative.   Psychiatric/Behavioral: Negative.  Negative for depression and suicidal ideas.    Physical Exam  Constitutional: She is oriented to person, place, and time. She appears well-developed.  Cardiovascular: Normal rate and regular rhythm.   Respiratory: No respiratory distress. She has no wheezes.  Decreased breath sounds  Neurological: She is oriented to person, place, and time.  Skin: Skin is warm and dry.  Bandaid to left elbow, reports skin torn area   BP 128/74 mmHg  Pulse 84  Resp 20  SpO2 95% Current Medications:   Current Outpatient Prescriptions  Medication Sig Dispense Refill  . albuterol (PROVENTIL HFA;VENTOLIN HFA) 108 (90 BASE) MCG/ACT inhaler Inhale 1-2 puffs into the lungs every 6 (six) hours as needed for wheezing or shortness of breath. 1 Inhaler 5  . aspirin 81 MG tablet Take 81 mg by mouth daily.      . butalbital-acetaminophen-caffeine (FIORICET WITH CODEINE) 50-325-40-30 MG per capsule Take 1 capsule by mouth every 6 (six) hours as needed.      . calcium carbonate (OS-CAL) 600 MG TABS tablet Take 600 mg by mouth daily with breakfast.    . Cetirizine HCl (ZYRTEC ALLERGY) 10 MG CAPS Take 1 capsule by mouth daily.    . Cholecalciferol (VITAMIN D) 1000 UNITS capsule Take  1,000 Units by mouth daily.      . diphenhydrAMINE (SOMINEX) 25 MG tablet Take 25 mg by mouth at bedtime as needed for sleep.    . fluticasone (FLONASE) 50 MCG/ACT nasal spray Place 2 sprays into both nostrils daily. 16 g 1  . Fluticasone Furoate-Vilanterol (BREO ELLIPTA) 100-25 MCG/INH AEPB Inhale 1 puff into the lungs daily. 60 each 6  . gabapentin (NEURONTIN) 100 MG capsule Take 100 mg by mouth 2 (two) times daily.    [name] ipratropium-albuterol (DUONEB) 0.5-2.5 (3) MG/3ML SOLN Take 3 mLs by nebulization every 4 (four) hours as needed.     . Iron-FA-B Cmp-C-Biot-Probiotic (FUSION PLUS) CAPS Take 1 capsule by mouth daily.    [name] levothyroxine (SYNTHROID, LEVOTHROID) 175 MCG tablet Take 137 mcg by mouth daily before breakfast.     . losartan (COZAAR) 100 MG tablet Take 100 mg by mouth daily.    . magnesium oxide (MAG-OX) 400 MG tablet Take 400 mg by mouth daily.      . metFORMIN (GLUCOPHAGE-XR) 500 MG 24 hr tablet 1 tablet daily.    [name] omeprazole (PRILOSEC) 20 MG capsule Take 1 capsule (20 mg total) by mouth daily. 30 capsule 6  . Oxycodone-Acetaminophen (PERCOCET PO) Take by mouth every 8 (eight) hours as needed.     . pravastatin (PRAVACHOL) 40 MG tablet TAKE 1 TABLET ONCE DAILY. 30 tablet 11  . predniSONE (DELTASONE) 20 MG tablet Take 1 tablet (20 mg total) by mouth daily. 30 tablet 1  . sertraline (ZOLOFT) 50 MG tablet Take 50 mg  by mouth 2 (two) times daily. One tab in am and pm    . tiotropium (SPIRIVA) 18 MCG inhalation capsule Place 1 capsule (18 mcg total) into inhaler and inhale daily. 30 capsule 5  . Calcium Carbonate (CALCIUM 500 PO) Take 1 capsule by mouth daily.     [name] ibuprofen (ADVIL,MOTRIN) 200 MG tablet Take 200 mg by mouth every 6 (six) hours as needed.    [name] Spacer/Aero-Holding Chambers (AEROCHAMBER PLUS FLO-VU LARGE) MISC 1 each by Other route once. (Patient not taking: Reported on 01/26/2015) 1 each 0   No current facility-administered medications for this visit.    Functional  Status:   In your present state of health, do you have any difficulty performing the following activities: 12/31/2014  Hearing? N  Vision? N  Difficulty concentrating or making decisions? Y  Walking or climbing stairs? Y  Dressing or bathing? N  Doing errands, shopping? Y  Preparing Food and eating ? N  Using the Toilet? N  In the past six months, have you accidently leaked urine? Y  Do you have problems with loss of bowel control? Y  Managing your Medications? N  Managing your Finances? N  Housekeeping or managing your Housekeeping? Y    Fall/Depression Screening:    PHQ 2/9 Scores 12/31/2014  PHQ - 2 Score 1  PHQ- 9 Score 3    Assessment:   COPD : Denies increases in copd symptoms. Morgan Hays is able to tolerate, walking short distances in her home with continuous oxygen at 3 -4 liters, she spaces out activities, such as her personal care, she reports that she is in the green zone on today. She sleeps on a hospital bed that is in the living room,with all her needed items close by her. She uses her walker to ambulate in room and a her electric wheelchair to move about in the kitchen. Morgan Hays reports taking all of her medication as prescribed, and drinks boost daily.  Called [employer] to ensure that they have the information they need from Morgan Hays if transportation needed, she was able to provide them the information and has their contact information written down.  Plan:Will plan next  home visit on September 19 at 1100. Reminded Morgan Hays to call for concerns  [facility] CM Care Plan Problem One        Most Recent Value   Care Plan Problem One  Recent COPD  exacerbation Hospital  Admissons   Role Documenting the Problem One  Care Management [place] for Problem One  Active   THN Long Term Goal (31-90 days)   Patient will not experience Hospital admission in the next 31 days   THN Long Term Goal Start Date  12/31/14   [facility] Long Term Goal Met Date  02/11/15   THN CM Short  Term Goal #1 (0-30 days)  Patient will be able to report actions to take in the yellow zone    [facility] CM Short Term Goal #1 Start Date  12/31/14   [facility] CM Short Term Goal #1 Met Date  01/27/15   THN CM Short Term Goal #2 (0-30 days)  Patient will be able to teach back Green , Yellow and Red COPD zones  in the next 30 days    THN CM Short Term Goal #2 Start Date  01/27/15   [facility] CM Short Term Goal #2 Met Date  02/25/15   THN CM Short Term Goal #3 (0-30 days)  Patient will report checking her CBG  at least weekly   [facility] CM Short Term Goal #3 Start Date  02/25/15   Interventions for Short Tern Goal #3  Discussed importance of keeping track of blood sugar, while on medication, discuss signs and symptoms of hypo and hyperglycemia    THN CM Care Plan Problem Two        Most Recent Value   Care Plan Problem Two  Activity intolerance   Role Documenting the Problem Two  Care Management Coordinator   Care Plan for Problem Two  Active   Interventions for Problem Two Long Term Goal   Reviewed the importance of exercise and activity to increase strenght   THN Long Term Goal (31-90) days  Patient will report increased activity tolerance   THN Long Term Goal Start Date  02/25/15   [facility]N CM Short Term Goal #1 (0-30 days)  Patient will report increase in completing sitting exercises in the next 30 days   THN CM Short Term Goal #1 Start Date  02/25/15   Interventions for Short Term Goal #2   Reviewed sitting exercises to work on , ankle pumps, kicks, marches     [place], [place], [place] Management [phone]

## 2015-02-28 DIAGNOSIS — B370 Candidal stomatitis: Secondary | ICD-10-CM | POA: Diagnosis not present

## 2015-03-02 DIAGNOSIS — B370 Candidal stomatitis: Secondary | ICD-10-CM | POA: Diagnosis not present

## 2015-03-02 DIAGNOSIS — Z23 Encounter for immunization: Secondary | ICD-10-CM | POA: Diagnosis not present

## 2015-03-05 DIAGNOSIS — R0602 Shortness of breath: Secondary | ICD-10-CM | POA: Diagnosis not present

## 2015-03-05 DIAGNOSIS — J441 Chronic obstructive pulmonary disease with (acute) exacerbation: Secondary | ICD-10-CM | POA: Diagnosis not present

## 2015-03-06 ENCOUNTER — Telehealth: Payer: Self-pay | Admitting: Emergency Medicine

## 2015-03-06 NOTE — Telephone Encounter (Signed)
Spoke with pt, states that RB told her in the past that she did not need a bipap.  Pt was seen in [facility] hospital yesterday, had a bipap put on yesterday because her C02 was elevated during her ABG.  Pt wants to know how RB feels about this and how she should proceed.  I advised that RB was off this afternoon, she is ok with waiting for his response.  RB please advise.  Thanks!

## 2015-03-10 NOTE — Telephone Encounter (Signed)
RB please advise. Thanks.  

## 2015-03-10 NOTE — Telephone Encounter (Signed)
She needs an office visit - unclear whey she needed to be seen yesterday. If she had acute respiratory failure then BiPAP may have been appropriate. I'd like to see her, discuss what happened

## 2015-03-10 NOTE — Telephone Encounter (Signed)
Called and spoke with pt and informed of RB rec Pt stated that she has a f/u appt with RB at the end of the month Pt advised to keep appt and if symptoms worsen to call office back for sooner appt  Nothing further is needed

## 2015-03-16 ENCOUNTER — Other Ambulatory Visit: Payer: Self-pay

## 2015-03-16 NOTE — Patient Outreach (Signed)
I called Morgan Hays to follow up on her application for Extra Help.  She stated she was denied.  I asked if she still needed assistance with obtaining her Flector patches.  She stated that she did.  She stated they were so expensive that she may not be able to continue on them.  I told her there was a patient assistance program for them and we could help her apply for it.  She stated she would like that.  I will refer to [name], Care Management Assistant, to get the application started.  I will follow up in 1 month to determine if the application has been completed.  I will also encourage Damita to see if there are other medications we could apply for, like Breo and Spiriva.    [facility] CM Care Plan Problem One        Most Recent Value   Care Plan Problem One  Medication affordability   Role Documenting the Problem One  Clinical Pharmacist   Care Plan for Problem One  Active   THN Long Term Goal (31-90 days)  Morgan Hays will complete the paper work for applying for patient assistnace for Flector patches in the next 90 days evident by patient report and completed paperwork.   THN Long Term Goal Start Date  03/16/15   Interventions for Problem One Long Term Goal  I have referred Morgan Hays to the Care Management Assistant to start working on the paperwork to apply for assistance.       Steve Rattler, PharmD, [employer] consultant [phone]

## 2015-03-20 NOTE — Patient Outreach (Signed)
[facility]) Care Management  03/20/2015  Cynthie Garmon 11-Sep-1940 [id number]   Telephone outreach to patient to assist in completing patient assistance program applications. Patient agreed to complete the forms with me. Forms will be mailed to patient for signature and patient will send back to me along with required documentation to send to companies for processing. I will follow up with in 2 weeks.  Damita L. Rhodie, AAS [facility] Care Management Assistant

## 2015-03-23 ENCOUNTER — Other Ambulatory Visit: Payer: Self-pay | Admitting: *Deleted

## 2015-03-23 DIAGNOSIS — R05 Cough: Secondary | ICD-10-CM

## 2015-03-23 DIAGNOSIS — R059 Cough, unspecified: Secondary | ICD-10-CM

## 2015-03-23 MED ORDER — OMEPRAZOLE 20 MG PO CPDR: 20.0000 mg | DELAYED_RELEASE_CAPSULE | Freq: Every day | ORAL | Status: DC

## 2015-03-23 NOTE — Patient Outreach (Signed)
[facility]) Care Management   03/23/2015  Morgan Hays December 05, 1940 [id number]  Morgan Hays is an 74 y.o. female  Subjective: "I'm feeling better today than on Sunday", reports feeling in the green zone on today, except for the "left wrist hurting this morning".  Objective:   Review of Systems  Constitutional: Negative.   HENT: Negative.   Respiratory: Negative for cough, shortness of breath and wheezing.   Cardiovascular: Negative.   Gastrointestinal: Negative.   Musculoskeletal:       Left wrist discomfort  Skin: Negative.   Neurological: Negative.   Psychiatric/Behavioral: Negative.     Physical Exam  Constitutional: She is oriented to person, place, and time. She appears well-developed and well-nourished.  Cardiovascular: Normal rate and regular rhythm.   Respiratory: Effort normal. She has no wheezes. She has no rales.  Musculoskeletal:  Wearing a brace to left wrist area  Neurological: She is alert and oriented to person, place, and time.  Skin: Skin is warm and dry.  Psychiatric: She has a normal mood and affect. Her behavior is normal. Judgment and thought content normal.    Current Medications:   Current Outpatient Prescriptions  Medication Sig Dispense Refill  . albuterol (PROVENTIL HFA;VENTOLIN HFA) 108 (90 BASE) MCG/ACT inhaler Inhale 1-2 puffs into the lungs every 6 (six) hours as needed for wheezing or shortness of breath. 1 Inhaler 5  . aspirin 81 MG tablet Take 81 mg by mouth daily.      . Calcium Carbonate (CALCIUM 500 PO) Take 1 capsule by mouth daily.     . calcium carbonate (OS-CAL) 600 MG TABS tablet Take 600 mg by mouth daily with breakfast.    . Cetirizine HCl (ZYRTEC ALLERGY) 10 MG CAPS Take 1 capsule by mouth daily.    . Cholecalciferol (VITAMIN D) 1000 UNITS capsule Take 1,000 Units by mouth daily.      . diclofenac (FLECTOR) 1.3 % PTCH Place 1 patch onto the skin daily as needed. Apply one patch once daily as needed    .  diphenhydrAMINE (SOMINEX) 25 MG tablet Take 25 mg by mouth at bedtime as needed for sleep.    . fluticasone (FLONASE) 50 MCG/ACT nasal spray Place 2 sprays into both nostrils daily. 16 g 1  . Fluticasone Furoate-Vilanterol (BREO ELLIPTA) 100-25 MCG/INH AEPB Inhale 1 puff into the lungs daily. 60 each 6  . gabapentin (NEURONTIN) 100 MG capsule Take 100 mg by mouth 2 (two) times daily.    [name] ipratropium-albuterol (DUONEB) 0.5-2.5 (3) MG/3ML SOLN Take 3 mLs by nebulization every 4 (four) hours as needed.     . Iron-FA-B Cmp-C-Biot-Probiotic (FUSION PLUS) CAPS Take 1 capsule by mouth daily.    [name] levothyroxine (SYNTHROID, LEVOTHROID) 175 MCG tablet Take 137 mcg by mouth daily before breakfast.     . losartan (COZAAR) 100 MG tablet Take 100 mg by mouth daily.    . magnesium oxide (MAG-OX) 400 MG tablet Take 400 mg by mouth daily.      . metFORMIN (GLUCOPHAGE-XR) 500 MG 24 hr tablet 1 tablet daily.    [name] omeprazole (PRILOSEC) 20 MG capsule Take 1 capsule (20 mg total) by mouth daily. 30 capsule 6  . Oxycodone-Acetaminophen (PERCOCET PO) Take by mouth every 8 (eight) hours as needed.     . pravastatin (PRAVACHOL) 40 MG tablet TAKE 1 TABLET ONCE DAILY. 30 tablet 11  . predniSONE (DELTASONE) 20 MG tablet Take 1 tablet (20 mg total) by mouth daily. 30 tablet 1  .  sertraline (ZOLOFT) 50 MG tablet Take 50 mg by mouth 2 (two) times daily. One tab in am and pm    . tiotropium (SPIRIVA) 18 MCG inhalation capsule Place 1 capsule (18 mcg total) into inhaler and inhale daily. 30 capsule 5  . butalbital-acetaminophen-caffeine (FIORICET WITH CODEINE) 50-325-40-30 MG per capsule Take 1 capsule by mouth every 6 (six) hours as needed.      [name] ibuprofen (ADVIL,MOTRIN) 200 MG tablet Take 200 mg by mouth every 6 (six) hours as needed.    [name] Spacer/Aero-Holding Chambers (AEROCHAMBER PLUS FLO-VU LARGE) MISC 1 each by Other route once. (Patient not taking: Reported on 01/26/2015) 1 each 0   No current facility-administered  medications for this visit.    Functional Status:   In your present state of health, do you have any difficulty performing the following activities: 12/31/2014  Hearing? N  Vision? N  Difficulty concentrating or making decisions? Y  Walking or climbing stairs? Y  Dressing or bathing? N  Doing errands, shopping? Y  Preparing Food and eating ? N  Using the Toilet? N  In the past six months, have you accidently leaked urine? Y  Do you have problems with loss of bowel control? Y  Managing your Medications? N  Managing your Finances? N  Housekeeping or managing your Housekeeping? Y    Fall/Depression Screening:    PHQ 2/9 Scores 03/23/2015 12/31/2014  PHQ - 2 Score 1 1  PHQ- 9 Score 3 3    Assessment:   COPD: Morgan Hays reports being in the green zone on today, but reports that she was in the yellow zone on yesterday, symptoms improved with actions of purse lip breathing and nebulizer treatments. Morgan Hays had one ED visit in the last 3 weeks, stated" she wore the bipap on 30 minutes,that's all I could take it was suffocating", patient states that ED doctors  wanted her wear her oxygen at 2 liters not 3 liters as she continues to use. Morgan Hays has a follow up appointment with Dr.Byrum on September 27 and  Morgan Hays her NP on next week and plans to get her Flu shot at that time.  Chronic Back Pain Morgan Hays reports overall  her back pain has improved, she continues to take her as needed percocet, she reports that she is out of the flector patch and is working with [place] and Morgan Hays,CMA  to get assistance with medication, she provided additional information that I will take to [facility] on Wednesday.   Plan:  Will follow up Morgan Hays in one month with a home visit. Morgan Hays will continue to follow actions in the zone that she is having symptoms yellow or red zone.  [facility] CM Care Plan Problem One        Most Recent Value   Care Plan Problem One  Recent  COPD  exacerbation Hospital  Admissons   Role Documenting the Problem One  Care Management Coordinator   Care Plan for Problem One  Active   THN Long Term Goal (31-90 days)   Patient will not experience Hospital admission in the next 31 days   THN Long Term Goal Start Date  12/31/14   [facility] Long Term Goal Met Date  02/11/15   THN CM Short Term Goal #1 (0-30 days)  Patient will be able to report actions to take in the yellow zone    [facility] CM Short Term Goal #1 Start Date  12/31/14   [facility] CM Short Term Goal #1  Met Date  01/27/15   THN CM Short Term Goal #2 (0-30 days)  Patient will be able to teach back Green , Yellow and Red COPD zones  in the next 30 days    THN CM Short Term Goal #2 Start Date  01/27/15   [facility] CM Short Term Goal #2 Met Date  02/25/15   THN CM Short Term Goal #3 (0-30 days)  Patient will report checking her CBG at least weekly   THN CM Short Term Goal #3 Start Date  02/25/15   [facility] CM Short Term Goal #3 Met Date  03/23/15    [facility] CM Care Plan Problem Two        Most Recent Value   Care Plan Problem Two  Activity intolerance   Role Documenting the Problem Two  Care Management Coordinator   Care Plan for Problem Two  Active   Interventions for Problem Two Long Term Goal   Discussed with patient to spread activities out, pace herself, rest in between   [place] (31-90) days  Patient will report increased activity tolerance   THN Long Term Goal Start Date  03/23/15 Barrie Folk not met restarted]   THN CM Short Term Goal #1 (0-30 days)  Patient will report increase in completing sitting exercises in the next 30 days   THN CM Short Term Goal #1 Start Date  02/25/15   Interventions for Short Term Goal #2   Discuss with patient how exercise increases relaxation, muscle strenght and endurance

## 2015-03-31 ENCOUNTER — Encounter: Payer: Self-pay | Admitting: Emergency Medicine

## 2015-03-31 ENCOUNTER — Ambulatory Visit (INDEPENDENT_AMBULATORY_CARE_PROVIDER_SITE_OTHER): Payer: Medicare Other | Admitting: Emergency Medicine

## 2015-03-31 VITALS — BP 124/78 | HR 89 | Ht 64.0 in | Wt 169.0 lb

## 2015-03-31 DIAGNOSIS — J449 Chronic obstructive pulmonary disease, unspecified: Secondary | ICD-10-CM | POA: Diagnosis not present

## 2015-03-31 DIAGNOSIS — I2510 Atherosclerotic heart disease of native coronary artery without angina pectoris: Secondary | ICD-10-CM | POA: Diagnosis not present

## 2015-03-31 NOTE — Patient Instructions (Signed)
We will do a trial of stopping Breo to see if you tolerate this Continue your other medications as you are taking them Flu shot up to date We discussed BiPAP and other means of respiratory support. This may be a useful tool for use when you have a flare and worsening of her breathing but you may not be able to tolerate this. Certainly her ability to tolerate and wear the mask will influence whether this will ever be a useful tool for you at home.  We confirmed today that you would not want to undergo mechanical ventilation or CPR. I agree with this and support your wishes.  Follow with Dr Byrum in 3 months or sooner if you have any problems. 

## 2015-03-31 NOTE — Progress Notes (Signed)
Morgan Hays is a 74 year old woman who follows up today for her COPD, dyspnea, and for a history of a RLL pulmonary nodule. 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 No sgy has been planned due to her significant lung disease. Also with hx depression.   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 Could not tolerate Daliresp. She has been rx with pred + abx x 1 since last visit by PCP. Currently at baseline, O2 is set on 3L/min. She does have some wheezing. Taking loratadine and fluticasone every day.   ROV 07/26/11 -- Severe COPD on O2, chronic cough being rx for allergies and GERD. She is here for regular f/u. She mentions today that she is having some cyanosis, coolness and numbness in her feet for the last 5 -6 months. No w/u done yet, but she is going to mention to her PCP. Breathing has been stable, no flares since last visit. Taking Spiriva + Symbicort + SABA; uses albuterol at least once a day.   ROV 10/31/11 -- Severe COPD on O2, chronic cough being rx for allergies and GERD. Having more nasal sx, gtt, wheezing.  She has not had flare since last time.  Interested in Research scientist (medical).   ROV 02/01/12 -- Severe COPD on O2, chronic cough being rx for allergies and GERD. Since last visit she has undergone Myoview for atypical CP, reassuring study. 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 CT chest > no evidence PE.  Breathing has been stable, still limiting her exertion. Rarely uses SABA.  Spiriva + Symbicort, O2 on 3L/min. She believes that stopping cymbalta has helped her. Some occasional cough.   ROV 06/05/12 -- Severe COPD on O2, chronic cough being rx for allergies and GERD.  CAT score today 23. Spiriva + Symbicort, O2 on 3L/min.  Reports today that her energy is very low, not sure that it is worsening of breathing. She is using albuterol about every day. She had the flu shot. She had prednisone/abx in September. Went to ED in august  after a fall.   ROV 09/21/12 -- Severe COPD on O2, chronic cough being rx for allergies and GERD.  Her breathing is very limiting. She has been rx with pred x 2 since last time.   ROV 10/22/12 -- Severe COPD on O2, chronic cough being rx for allergies and GERD. Returns today reporting worsening dyspnea. We started daily pred a month ago to see if she would benefit. She can tell a difference at rest and w sleeping. No real difference in exertional SOB  ROV 11/21/12 -- Severe COPD, hypoxemia, chronic cough, GERD, allergic chronic rhinitis. We have initiated chronic pred, started tapering last visit, currently 15. Since last time she was evaluated by [name] for increased exertional SOB, overall malaise, was sent for CT chest as below > no PE, no infiltrates, stable emphysema. Also had thyroid studies, showed high TSH and synthroid increased. Her breathing is Ok at rest, worse with exertion.  Using duonebs 2x a day, spiriva + symbicort.   ROV 01/02/13 -- Severe COPD, hypoxemia, chronic cough, GERD, allergic chronic rhinitis. Her chronic pred is at  (increased last time). She doesn't notice any real change in breathing. Cough is stable. She wheezes with exertion.    ROV 02/22/13 -- Severe COPD, hypoxemia, chronic cough, GERD, allergic chronic rhinitis, chronic pred 20. She presents today with about 3 weeks of increased DOE, some increased LE edema. No  change in cough or wheeze. Has to rest with 20 ft. Using duonebs 3-4x a day. O2 on 4L/min pulsed (continuous at home). Her wt is stable.   ROV 03/27/13 -- Severe COPD, hypoxemia, chronic cough, GERD, allergic chronic rhinitis, chronic pred 20. Last time we tried changing symbicort to [facility] to see if she would benefit.   ROV 06/21/13 -- Severe COPD, hypoxemia, chronic cough, GERD, allergic chronic rhinitis, chronic pred 20. She unfortunately has been dealing with with several problems > anemia, abnormal thyroid testing, has had falls since last time. She is to  see NSGY for newly identified aneurysm. She feel like she loses power in her legs, not really a balance problem. He O2 is set on 3-4L/min. Happens going from seated to standing position. She has had some disorientation, ? Related to pred.   ROV 10/23/13 -- severe COPD, hypoxemia, chronic cough. Chronic pred 20. Seen by Dr Sherene Sires 4/1 for mid-back pain. Still happens intermittently.  She is taking benadryl and zyrtec. She continues to have exertional SOB. Also some instability. She is on breo + Spiriva.   ROV 12/13/13 -- severe COPD on pred 20, hypoxemia, chronic cough. She unfortunately experienced a ruptured divertic, required ileostomy. She was readmitted for hypercapneic resp failure and required BiPAP temporarily. She is now back at home.  She wonders about whether she needs BiPAP.   ROV 02/25/14 -- follow up for severe COPD, hypoxemia, chronic cough. She is on Breo + Spiriva. She uses duonebs 1-2x a day. Her O2 is on 3 pulsed w exertion.  Her stable Pred dose is . She is wondering if she can have her ileostomy reversed - but she acknowledges that it would be high risk. No flares, no new meds. Sx are stable.   ROV 06/03/14 -- Follow-up visit for COPD, interstitial lung disease, hypoxemia. She remains on Breo, Spiriva and prednisone 20 mg daily. She is quite limited with her daily activities, is stable at rest. We talked today about her life expectancy, the risks of CPR or mechanical ventilation. She underscored  her desire to be DO NOT RESUSCITATE.   ROV 10/10/14 -- follow-up visit for hypoxemic respiratory failure in the setting of interstitial lung disease and COPD. She's currently managed on prednisone 20 mg daily as well as Spiriva and Breo. She is doing fairly well. She has not had any hospitalizations, but she did go to urgent care and was treated for sinusitis.    Post Hospital follow up 11/09/14 Pt returns for a post hospital follow up  Recently admitted to [facility] ? Reason .  Records  requested.  Says she is feeling better .  Breathing is at baseline with no flare of cough, dyspnea .  Does have back pain with recently dx compression fx Seeing ortho in [place] .  Very upset about pain and tx options.  No chest pain, orthopnea, edema or fever Steroid education given .    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 She was hospitalized in April for AE-COPD, hypercapnia.  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 She is supposed to have an epidural injection soon. Wearing 3-4L/min. Using breo and spiriva, SABA prn.   ROV 03/31/15 -- follow-up visit for chronic hypoxemia, interstitial lung disease, COPD. She is receiving percocet prn for her back pain. Has not received injections since last time. She is on  pred 20, spiriva, breo, duoneb prn. She uses about 1-2x a day. Her O2 is at . She is unclear that she has benefited from breo. She believes that she is a bit worse.  She had an episode of acute resp failure that landed her at [place]. They tried to use BiPAP and she refused it.       EXAM  Filed Vitals:   03/31/15 1340 03/31/15 1342  BP:  124/78  Pulse:  89  Height:  (1.626 m)   Weight: 169 lb (76.658 kg)   SpO2:  91%    Gen: Pleasant, obese, in no distress, elderly and chronically ill appearing   ENT: No lesions,  mouth clear,  oropharynx clear, no postnasal drip  Neck: No JVD, no TMG, no carotid bruits  Lungs: Distant  BS w/ no wheezing, very distant  Cardiovascular: RRR, heart sounds normal, no murmur or gallops, trace edema  Musculoskeletal: No deformities, no cyanosis or clubbing  Neuro: alert, non focal  Skin: Warm, no lesions or rashes   12/19/11 Myoview:  Overall Impression: Normal stress nuclear study with a small, mild, fixed apical defect consistent with thinning; no ischemia.  LV Ejection Fraction: 79%. LV Wall Motion: NL LV  Function; NL Wall Motion  CT chest 11/06/12 >>  No PE, centra-lobular emphysema, no infiltrates or nodules, stable thoracic aortic aneurysm   COPD (chronic obstructive pulmonary disease) We will do a trial of stopping Breo to see if you tolerate this Continue your other medications as you are taking them Flu shot up to date We discussed BiPAP and other means of respiratory support. This may be a useful tool for use when you have a flare and worsening of her breathing but you may not be able to tolerate this. Certainly her ability to tolerate and wear the mask will influence whether this will ever be a useful tool for you at home.  We confirmed today that you would not want to undergo mechanical ventilation or CPR. I agree with this and support your wishes.  Follow with Dr Delton Coombes in 3 months or sooner if you have any problems.

## 2015-03-31 NOTE — Assessment & Plan Note (Signed)
We will do a trial of stopping Breo to see if you tolerate this Continue your other medications as you are taking them Flu shot up to date We discussed BiPAP and other means of respiratory support. This may be a useful tool for use when you have a flare and worsening of her breathing but you may not be able to tolerate this. Certainly her ability to tolerate and wear the mask will influence whether this will ever be a useful tool for you at home.  We confirmed today that you would not want to undergo mechanical ventilation or CPR. I agree with this and support your wishes.  Follow with Dr Delton Coombes in 3 months or sooner if you have any problems.

## 2015-04-03 NOTE — Patient Outreach (Signed)
[facility]) Care Management  04/03/2015  Kent Braunschweig Dec 14, 1940 [id number]   Telephone outreach to patient to follow up on patient assistance applications sent to her. Patient stated that she received the packet in the mail yesterday and will complete forms and mail them back to me. Patient also stated that the doctor took her off the [facility] medication so we will not need to do the patient assistance application for medication. I will follow up in 2 weeks.  Damita L. Rhodie, AAS [facility] Care Management Assistant

## 2015-04-13 ENCOUNTER — Other Ambulatory Visit: Payer: Self-pay

## 2015-04-13 NOTE — Patient Outreach (Signed)
I called Ms. Sickles to follow up on her pharmacy needs.  She had sent in her forms back to [place] and she had not heard back from the pharmaceutical companies.  I stated I would follow up with Damita to see if she had heard anything as well.  I asked if she had any other pharmacy needs and she stated she did not.  I stated that she could call me if she had any other pharmacy issues and she stated she would.  I will follow up with Damita to see if she had heard from the pharmaceutical companies.    Steve Rattler, PharmD, [employer] [facility] [employer] consultant [phone]

## 2015-04-14 DIAGNOSIS — E034 Atrophy of thyroid (acquired): Secondary | ICD-10-CM | POA: Diagnosis not present

## 2015-04-14 DIAGNOSIS — F331 Major depressive disorder, recurrent, moderate: Secondary | ICD-10-CM | POA: Diagnosis not present

## 2015-04-14 DIAGNOSIS — J440 Chronic obstructive pulmonary disease with acute lower respiratory infection: Secondary | ICD-10-CM | POA: Diagnosis not present

## 2015-04-14 DIAGNOSIS — E1140 Type 2 diabetes mellitus with diabetic neuropathy, unspecified: Secondary | ICD-10-CM | POA: Diagnosis not present

## 2015-04-14 DIAGNOSIS — Z23 Encounter for immunization: Secondary | ICD-10-CM | POA: Diagnosis not present

## 2015-04-14 DIAGNOSIS — F17211 Nicotine dependence, cigarettes, in remission: Secondary | ICD-10-CM | POA: Diagnosis not present

## 2015-04-14 DIAGNOSIS — E1165 Type 2 diabetes mellitus with hyperglycemia: Secondary | ICD-10-CM | POA: Diagnosis not present

## 2015-04-14 DIAGNOSIS — E782 Mixed hyperlipidemia: Secondary | ICD-10-CM | POA: Diagnosis not present

## 2015-04-14 DIAGNOSIS — I10 Essential (primary) hypertension: Secondary | ICD-10-CM | POA: Diagnosis not present

## 2015-04-20 ENCOUNTER — Other Ambulatory Visit: Payer: Self-pay | Admitting: *Deleted

## 2015-04-20 NOTE — Patient Outreach (Addendum)
[name]) Care Management   04/20/2015  Morgan Hays 01-26-1941 [id number]  Morgan Hays is an 74 y.o. female   Routine home visit, on arrival patient up in living room, ambulating to the kitchen.  Subjective: "I've had the crud, patient reports that she visited per primary care office on 10/11, last Tuesday due to cough, with thick greens secretion and congestion,and was started on antibiotics amoxicillin , she also reports she called again on this am, because she still has some congestion and they have started a new antibiotic Levaquin, that her daughter will pick up from the pharmacy this evening,. Patient reports that her breathing is in the green " her normal breathing".  Objective:   Review of Systems  Constitutional: Negative.   HENT: Negative.   Eyes: Negative.   Respiratory: Positive for cough and sputum production. Negative for hemoptysis and wheezing.   Cardiovascular: Negative for chest pain, palpitations and leg swelling.  Gastrointestinal: Negative.        Ileostomy  Genitourinary: Negative.   Musculoskeletal: Positive for back pain. Negative for falls.       Complaint of carpel tunnel discomfort left wrist  Skin: Negative.   Psychiatric/Behavioral: Positive for depression. Negative for suicidal ideas. The patient is not nervous/anxious.     Physical Exam  Constitutional: She appears well-nourished.  Cardiovascular: Normal rate and normal heart sounds.   Respiratory: Breath sounds normal. No respiratory distress. She has no wheezes. She has no rales.  Slightly decreased breath sounds  Skin: Skin is warm and dry.  Psychiatric: She has a normal mood and affect. Her behavior is normal. Judgment and thought content normal.   BP 120/72 mmHg  Pulse 72  Resp 18  SpO2 98% Current Medications:   Current Outpatient Prescriptions  Medication Sig Dispense Refill  . albuterol (PROVENTIL HFA;VENTOLIN HFA) 108 (90 BASE) MCG/ACT inhaler Inhale 1-2 puffs into  the lungs every 6 (six) hours as needed for wheezing or shortness of breath. 1 Inhaler 5  . aspirin 81 MG tablet Take 81 mg by mouth daily.      . butalbital-acetaminophen-caffeine (FIORICET WITH CODEINE) 50-325-40-30 MG per capsule Take 1 capsule by mouth every 6 (six) hours as needed.      . Calcium Carbonate (CALCIUM 500 PO) Take 1 capsule by mouth daily.     . Cetirizine HCl (ZYRTEC ALLERGY) 10 MG CAPS Take 1 capsule by mouth daily.    . Cholecalciferol (VITAMIN D) 1000 UNITS capsule Take 1,000 Units by mouth daily.      . diphenhydrAMINE (SOMINEX) 25 MG tablet Take 25 mg by mouth at bedtime as needed for sleep.    . fluticasone (FLONASE) 50 MCG/ACT nasal spray Place 2 sprays into both nostrils daily. 16 g 1  . gabapentin (NEURONTIN) 100 MG capsule Take 100 mg by mouth 2 (two) times daily.    [name] ipratropium-albuterol (DUONEB) 0.5-2.5 (3) MG/3ML SOLN Take 3 mLs by nebulization every 4 (four) hours as needed.     [name] levothyroxine (SYNTHROID, LEVOTHROID) 175 MCG tablet Take 137 mcg by mouth daily before breakfast.     . losartan (COZAAR) 100 MG tablet Take 100 mg by mouth daily.    . magnesium oxide (MAG-OX) 400 MG tablet Take 400 mg by mouth daily.      . metFORMIN (GLUCOPHAGE-XR) 500 MG 24 hr tablet 1 tablet daily.    [name] omeprazole (PRILOSEC) 20 MG capsule Take 1 capsule (20 mg total) by mouth daily. 30 capsule 6  . Oxycodone-Acetaminophen (  PERCOCET PO) Take by mouth every 8 (eight) hours as needed.     . pravastatin (PRAVACHOL) 40 MG tablet TAKE 1 TABLET ONCE DAILY. 30 tablet 11  . predniSONE (DELTASONE) 20 MG tablet Take 1 tablet (20 mg total) by mouth daily. 30 tablet 1  . sertraline (ZOLOFT) 50 MG tablet Take 50 mg by mouth 2 (two) times daily. One tab in am and pm    . tiotropium (SPIRIVA) 18 MCG inhalation capsule Place 1 capsule (18 mcg total) into inhaler and inhale daily. 30 capsule 5  . calcium carbonate (OS-CAL) 600 MG TABS tablet Take 600 mg by mouth daily with breakfast.    .  diclofenac (FLECTOR) 1.3 % PTCH Place 1 patch onto the skin daily as needed. Apply one patch once daily as needed    . Fluticasone Furoate-Vilanterol (BREO ELLIPTA) 100-25 MCG/INH AEPB Inhale 1 puff into the lungs daily. (Patient not taking: Reported on 04/20/2015) 60 each 6  . ibuprofen (ADVIL,MOTRIN) 200 MG tablet Take 200 mg by mouth every 6 (six) hours as needed.    . Iron-FA-B Cmp-C-Biot-Probiotic (FUSION PLUS) CAPS Take 1 capsule by mouth daily.    [name] Spacer/Aero-Holding Chambers (AEROCHAMBER PLUS FLO-VU LARGE) MISC 1 each by Other route once. (Patient not taking: Reported on 04/20/2015) 1 each 0   No current facility-administered medications for this visit.    Functional Status:   In your present state of health, do you have any difficulty performing the following activities: 04/20/2015 12/31/2014  Hearing? Y N  Vision? N N  Difficulty concentrating or making decisions? Morgan Hays  Walking or climbing stairs? Y Y  Dressing or bathing? N N  Doing errands, shopping? Morgan Hays  Preparing Food and eating ? N N  Using the Toilet? N N  In the past six months, have you accidently leaked urine? Y Y  Do you have problems with loss of bowel control? Y Y  Managing your Medications? N N  Managing your Finances? N N  Housekeeping or managing your Housekeeping? Morgan Hays    Fall/Depression Screening:    PHQ 2/9 Scores 03/23/2015 12/31/2014  PHQ - 2 Score 1 1  PHQ- 9 Score 3 3   Fall Risk  03/23/2015 01/27/2015 12/31/2014  Falls in the past year? Yes Yes Yes  Number falls in past yr: '1 1 1  ' Injury with Fall? No No -  Risk for fall due to : History of fall(s);Impaired balance/gait History of fall(s);Impaired balance/gait Impaired balance/gait;History of fall(s);Impaired mobility  Risk for fall due to (comments): - uses rolling walker -  Follow up Falls evaluation completed Falls evaluation completed Education provided    Assessment:    COPD: Morgan Hays continues on 4 liters nasal cannula continuous, reports  that she is taking her medication, inhalers and nebulizer treatments as prescribed and describes her breathing in the green zone. Mrs.Stacey was previously prescribed amoxicillin  and understands to stop that as she will begin Levaquin and to complete the full prescription. She reports primary care office will call her in a prescription for cough medication.Mrs.Yakel reports that she is update with her flu shot and pneumonia vaccines.   Chronic Back Pain : Reports that back pain is tolerable with using percocet as needed for pain. Mrs. Silberstein reports that she will follow up with pain control doctor again in November. Reports that she able to complete her normal ADL's.  Diabetes: Mr. Chura checks her blood sugar 2 to 3 times per week, states her blood sugar at  10 am this morning was 76, denies any symptoms of hypoglycemia and  no change in appetite or food intake. Patient able to state action to take if she has hypoglycemic event.Encouraged to document blood sugars and vary time of day she is checking it, before morning meal, before evening meal.  Plan:   Encouraged patient to notify her PCP if she does not start to feel better or have a increase in symptoms, encouraged to make sure she is drinking enough fluids. Patient will continue to check her blood sugar and will begin to keep a record Mrs. Vankuren will begin carpel tunnel exercises as tolerated and work toward restarting chair exercises. Will schedule home visit in November Will PCP this note as quarterly update  [facility] CM Care Plan Problem One        Most Recent Value   Care Plan Problem One  Recent COPD  exacerbation Hospital  Admissons   Role Documenting the Problem One  [place] for Problem One  Active   [facility] Long Term Goal (31-90 days)   Patient will not experience Hospital admission in the next 31 days   [place] Term Goal Start Date  12/31/14   [facility] Long Term Goal Met Date  02/11/15   [facility] CM Short Term  Goal #1 (0-30 days)  Patient will be able to report actions to take in the yellow zone    [facility] CM Short Term Goal #1 Start Date  12/31/14   [facility] CM Short Term Goal #1 Met Date  01/27/15   [facility] CM Short Term Goal #2 (0-30 days)  Patient will be able to teach back Green , Yellow and Red COPD zones  in the next 30 days    [facility] CM Short Term Goal #2 Start Date  01/27/15   [facility] CM Short Term Goal #2 Met Date  02/25/15   [facility] CM Short Term Goal #3 (0-30 days)  Patient will report checking her CBG at least weekly   [facility] CM Short Term Goal #3 Start Date  02/25/15   [facility] CM Short Term Goal #3 Met Date  03/23/15    [facility] CM Care Plan Problem Two        Most Recent Value   Care Plan Problem Two  Activity intolerance   Role Documenting the Problem Two  Care Management [place] for Problem Two  Active   [facility] Long Term Goal (31-90) days  Patient will report increased activity tolerance   [facility] Long Term Goal Start Date  03/23/15 Barrie Folk not met restarted]   [facility] Long Term Goal Met Date  04/20/15   [facility] CM Short Term Goal #1 (0-30 days)  Patient will report increase in completing sitting exercises in the next 30 days   [facility] CM Short Term Goal #1 Start Date  04/20/15 [goal date restarted]   Interventions for Short Term Goal #2   Reviewed benefits of exercise, reviewed chair exercise that she can complete also with exercise for carpel tunnel prescribed by PCP      Joylene Draft, RN, [place] Management [phone]- Mobile [phone]- [place]

## 2015-04-23 NOTE — Patient Outreach (Signed)
[facility]) Care Management  04/23/2015  Humberto LeepJudy Hipwell 08/05/1940 [id number]   Telephone outreach to patient to inform her I am still waiting on the physician forms for the patient assistance applications from her providers. I faxed them as a 2nd urgent request. I also ask her to give the providers a call requesting the forms be completed so we can get them sent off to the patient assistance programs for processing and she states she will. I will follow up in a week to verify status of the forms being completed.  Tru Rana L. Nadeen Shipman, AAS [facility] Care Management Assistant

## 2015-04-28 ENCOUNTER — Other Ambulatory Visit: Payer: Self-pay | Admitting: *Deleted

## 2015-04-28 MED ORDER — FLUTICASONE PROPIONATE 50 MCG/ACT NA SUSP: 2.0000 | Freq: Every day | NASAL | Status: AC

## 2015-05-06 ENCOUNTER — Telehealth: Payer: Self-pay | Admitting: Emergency Medicine

## 2015-05-06 DIAGNOSIS — J449 Chronic obstructive pulmonary disease, unspecified: Secondary | ICD-10-CM

## 2015-05-06 MED ORDER — TIOTROPIUM BROMIDE MONOHYDRATE 18 MCG IN CAPS: 18.0000 ug | ORAL_CAPSULE | Freq: Every day | RESPIRATORY_TRACT | Status: DC

## 2015-05-06 NOTE — Patient Outreach (Signed)
[facility]) Care Management  05/06/2015  Humberto LeepJudy Castrillon 05/05/1941 [id number]   Telephone outreach to Jaynee EaglesLindsay Lemons at Dr. Kavin LeechByrum's office regarding the patient assistance program form. I left voice message with receptionist to have Lillia AbedLindsay call me back.  Madalin Hughart L. Valyncia Wiens, AAS [facility] Care Management Assistant

## 2015-05-06 NOTE — Telephone Encounter (Signed)
Spoke with Donita, states she received a completed and signed form from [facility] on pt's Spiriva, but also needs a copy of the rx sent to [facility] at [phone] faxed with a cover sheet.   This has been faxed.  Nothing further needed.

## 2015-05-07 NOTE — Patient Outreach (Signed)
[facility]) Care Management  05/07/2015  Humberto LeepJudy Diffee 09/08/1940 [id number]   Telephone outreach to patient regarding her patient assistance applications. I informed patient I received the physician form back from Dr. Delton CoombesByrum for her Spiriva medication and Morrie Sheldonshley at his office was faxing over the prescription directly to [facility]. I faxed over the remaining of the application from our office. In regards to her [employer] patient assistance application for her [name] medication, I informed her I'm still waiting to receive the physician form back from Dr. Eliane DecreePatel's office. I've sent the request twice listed as urgent and still no response. I told her I would follow up again with the office today and ask if she could also follow up with them. I will follow up with the patient in 2 weeks regarding the [employer] patient assistance application submitted.  Chaitra Mast L. Leaha Cuervo, AAS [facility] Care Management Assistant

## 2015-05-11 ENCOUNTER — Telehealth: Payer: Self-pay | Admitting: Emergency Medicine

## 2015-05-11 NOTE — Telephone Encounter (Signed)
Left message for Morgan Hays to call back.

## 2015-05-12 NOTE — Telephone Encounter (Signed)
lmtcb for Shai.

## 2015-05-13 NOTE — Telephone Encounter (Signed)
lmtcb x3 

## 2015-05-14 NOTE — Telephone Encounter (Signed)
LM for Shai to return call

## 2015-05-15 NOTE — Telephone Encounter (Signed)
LM for Shai to call office back

## 2015-05-18 NOTE — Telephone Encounter (Signed)
Another message has been left for Morgan Hays. Per triage protocol, this message will be closed.

## 2015-05-22 ENCOUNTER — Other Ambulatory Visit: Payer: Self-pay | Admitting: *Deleted

## 2015-05-22 ENCOUNTER — Telehealth: Payer: Self-pay | Admitting: Emergency Medicine

## 2015-05-22 ENCOUNTER — Encounter: Payer: Self-pay | Admitting: *Deleted

## 2015-05-22 NOTE — Telephone Encounter (Signed)
Spoke with pt. She reports she had been seeing on TV that prilosec can cause renal failure. She last had BUN/CREATININE done 08/2014 which was BUN 59 and creat 1.80. She wants to know if the prilosec could affect the levels.  Please advise Dr. Delton CoombesByrum. thanks

## 2015-05-22 NOTE — Patient Outreach (Signed)
[facility]) Care Management   05/22/2015  Fransisca Shawn 09/28/1940 [id number]  Margan Elias is an 74 y.o. female  Subjective: I over did it yesterday and it caught up with me  Objective:   Review of Systems  Constitutional: Negative.   HENT: Negative.   Respiratory: Positive for cough, sputum production and shortness of breath.        Yellowish sputum at times, shortness of breath with increased activity  Genitourinary: Negative.   Musculoskeletal: Positive for back pain.  Skin: Negative.   Neurological: Negative.   Psychiatric/Behavioral: Negative for depression and suicidal ideas.    Physical Exam  Constitutional: She is oriented to person, place, and time. She appears well-developed and well-nourished.  Cardiovascular: Normal rate and normal heart sounds.   Respiratory: Effort normal. No respiratory distress. She has no wheezes.  Decreased breath sounds at bases bilaterally  GI: Soft.  Neurological: She is alert and oriented to person, place, and time.  Skin: Skin is warm and dry.  Psychiatric: She has a normal mood and affect. Her behavior is normal. Judgment and thought content normal.   BP 122/60 mmHg  Pulse 86  Resp 20  SpO2 97% Current Medications:   Current Outpatient Prescriptions  Medication Sig Dispense Refill  . albuterol (PROVENTIL HFA;VENTOLIN HFA) 108 (90 BASE) MCG/ACT inhaler Inhale 1-2 puffs into the lungs every 6 (six) hours as needed for wheezing or shortness of breath. 1 Inhaler 5  . aspirin 81 MG tablet Take 81 mg by mouth daily.      . butalbital-acetaminophen-caffeine (FIORICET WITH CODEINE) 50-325-40-30 MG per capsule Take 1 capsule by mouth every 6 (six) hours as needed.      . calcium carbonate (OS-CAL) 600 MG TABS tablet Take 600 mg by mouth daily with breakfast.    . Cetirizine HCl (ZYRTEC ALLERGY) 10 MG CAPS Take 1 capsule by mouth daily.    . Cholecalciferol (VITAMIN D) 1000 UNITS capsule Take 1,000 Units by mouth daily.       . diphenhydrAMINE (SOMINEX) 25 MG tablet Take 25 mg by mouth at bedtime as needed for sleep.    . fluticasone (FLONASE) 50 MCG/ACT nasal spray Place 2 sprays into both nostrils daily. 16 g 5  . gabapentin (NEURONTIN) 100 MG capsule Take 100 mg by mouth 2 (two) times daily.    [name] ipratropium-albuterol (DUONEB) 0.5-2.5 (3) MG/3ML SOLN Take 3 mLs by nebulization every 4 (four) hours as needed.     . Iron-FA-B Cmp-C-Biot-Probiotic (FUSION PLUS) CAPS Take 1 capsule by mouth daily.    [name] levothyroxine (SYNTHROID, LEVOTHROID) 175 MCG tablet Take 137 mcg by mouth daily before breakfast.     . losartan (COZAAR) 100 MG tablet Take 100 mg by mouth daily.    . magnesium oxide (MAG-OX) 400 MG tablet Take 400 mg by mouth daily.      . metFORMIN (GLUCOPHAGE-XR) 500 MG 24 hr tablet 1 tablet daily.    [name] omeprazole (PRILOSEC) 20 MG capsule Take 1 capsule (20 mg total) by mouth daily. 30 capsule 6  . Oxycodone-Acetaminophen (PERCOCET PO) Take by mouth every 8 (eight) hours as needed.     . pravastatin (PRAVACHOL) 40 MG tablet TAKE 1 TABLET ONCE DAILY. 30 tablet 11  . predniSONE (DELTASONE) 20 MG tablet Take 1 tablet (20 mg total) by mouth daily. 30 tablet 1  . sertraline (ZOLOFT) 50 MG tablet Take 50 mg by mouth 2 (two) times daily. One tab in am and pm    .  tiotropium (SPIRIVA) 18 MCG inhalation capsule Place 1 capsule (18 mcg total) into inhaler and inhale daily. 30 capsule 11  . Calcium Carbonate (CALCIUM 500 PO) Take 1 capsule by mouth daily.     . diclofenac (FLECTOR) 1.3 % PTCH Place 1 patch onto the skin daily as needed. Apply one patch once daily as needed    . Fluticasone Furoate-Vilanterol (BREO ELLIPTA) 100-25 MCG/INH AEPB Inhale 1 puff into the lungs daily. (Patient not taking: Reported on 04/20/2015) 60 each 6  . ibuprofen (ADVIL,MOTRIN) 200 MG tablet Take 200 mg by mouth every 6 (six) hours as needed.    [name] Spacer/Aero-Holding Chambers (AEROCHAMBER PLUS FLO-VU LARGE) MISC 1 each by Other route once.  (Patient not taking: Reported on 04/20/2015) 1 each 0   No current facility-administered medications for this visit.    Functional Status:   In your present state of health, do you have any difficulty performing the following activities: 04/20/2015 12/31/2014  Hearing? Y N  Vision? N N  Difficulty concentrating or making decisions? [name]  Walking or climbing stairs? Y Y  Dressing or bathing? N N  Doing errands, shopping? [name]  Preparing Food and eating ? N N  Using the Toilet? N N  In the past six months, have you accidently leaked urine? Y Y  Do you have problems with loss of bowel control? Y Y  Managing your Medications? N N  Managing your Finances? N N  Housekeeping or managing your Housekeeping? [name]    Fall/Depression Screening:    PHQ 2/9 Scores 03/23/2015 12/31/2014  PHQ - 2 Score 1 1  PHQ- 9 Score 3 3    Assessment:    COPD Reports breathing in green zone after her first nebulizer treatment, patient has not experienced an exacerbation of COPD requiring ED visit or hospitalization. Patient continues with 24 hours via nasal cannula 3-4 liters.  Chronic Back pain: Patient continues to tolerate back pain with use of her prn percocet, she is not using [name] due to cost our, [place] is working with patient on getting assistance for this medication. Patient reports that her pain has not limited her with completing her daily routine just takes a little longer. Patient has a follow up appointment with the pain clinic on next week.  Diabetes: Mrs.Trolinger reports that she only check her blood sugar once or twice a week, with results staying in the 120 range. Patient denies any hypoglycemic episodes and continue to take all of her medication as prescribed...     Plan:  Reviewed patient centered goals and evaluated progress Follow up [place] regarding progress with  medication assistance   Plan home visit for December  [facility] CM Care Plan Problem One        Most  Recent Value   Care Plan Problem One  Recent COPD  exacerbation Hospital  Admissons   Role Documenting the Problem One  Care Management [place] for Problem One  Active   [facility] Long Term Goal (31-90 days)   Patient will not experience Hospital admission in the next 31 days   [facility] Long Term Goal Start Date  12/31/14   [facility] Long Term Goal Met Date  02/11/15   [facility] CM Short Term Goal #1 (0-30 days)  Patient will be able to report actions to take in the yellow zone    [facility] CM Short Term Goal #1 Start Date  12/31/14   [facility] CM Short Term Goal #  1 Met Date  01/27/15   [facility] CM Short Term Goal #2 (0-30 days)  Patient will be able to teach back Green , Yellow and Red COPD zones  in the next 30 days    [facility] CM Short Term Goal #2 Start Date  01/27/15   [facility] CM Short Term Goal #2 Met Date  02/25/15   [facility] CM Short Term Goal #3 (0-30 days)  Patient will report checking her CBG at least weekly   [facility] CM Short Term Goal #3 Start Date  02/25/15   [facility] CM Short Term Goal #3 Met Date  03/23/15    [facility] CM Care Plan Problem Two        Most Recent Value   Care Plan Problem Two  Activity intolerance   Role Documenting the Problem Two  Care Management Coordinator   Care Plan for Problem Two  Active   [facility]N Long Term Goal (31-90) days  Patient will report increased activity tolerance   [facility] Long Term Goal Start Date  03/23/15 Barrie Folk not met restarted]   [facility]N Long Term Goal Met Date  04/20/15   [facility] CM Short Term Goal #1 (0-30 days)  Patient will report increase in completing sitting exercises in the next 30 days   [facility] CM Short Term Goal #1 Start Date  04/20/15 [goal date restarted]   Interventions for Short Term Goal #2   Discussed importance of exercise on maintaining strenght and balance, reviewed exercise in previous information pack from home health PT      Joylene Draft, RN, [place] Management [phone]- Mobile [phone]- [place]

## 2015-05-25 NOTE — Telephone Encounter (Signed)
Let her know that prilosec has been shown to be associated with renal dysfunction in some rare cases. Doesn't happen often (in fact i've never seen it) but it is possible. Hope that this helps her.

## 2015-05-25 NOTE — Telephone Encounter (Signed)
lmtcb X1 for pt to relay info below.

## 2015-05-26 ENCOUNTER — Other Ambulatory Visit: Payer: Self-pay

## 2015-05-26 NOTE — Telephone Encounter (Signed)
Pt aware of rec's per RB as below. Nothing further needed.

## 2015-05-26 NOTE — Patient Outreach (Signed)
I received notification that Morgan Hays has received her Spiriva from the patient assistance program.  Morgan Hays, care management assistant, is still trying to determine where the application for Flector patches is with [employer].  She has completed her care plan with me.  I will continue to monitor to see if she is approved for Flector patches.    [facility] CM Care Plan Problem One        Most Recent Value   Care Plan Problem One  Medication affordability   Role Documenting the Problem One  Clinical Pharmacist   Care Plan for Problem One  Active   [facility] Long Term Goal (31-90 days)  Morgan Hays will complete the paper work for applying for patient assistnace for Flector patches in the next 90 days evident by patient report and completed paperwork.   [facility] Long Term Goal Start Date  03/16/15   [facility] Long Term Goal Met Date  05/26/15   Interventions for Problem One Long Term Goal  I have referred Morgan Hays to the Care Management Assistant to start working on the paperwork to apply for assistance.       Morgan Hays, PharmD, BCACP [facility] Pharmacy Manager [phone]  

## 2015-06-01 DIAGNOSIS — J449 Chronic obstructive pulmonary disease, unspecified: Secondary | ICD-10-CM | POA: Diagnosis not present

## 2015-06-05 NOTE — Patient Outreach (Signed)
[facility]) Care Management  06/05/2015  Humberto LeepJudy Kem 12/19/1940 [id number]   Telephone outreach to patient to inform her that I have not received the physician form back from Dr. Allena KatzPatel after 3 urgent attempts so I can submit her patient assistance application for her [name] to the patient assistance program. She has agreed to take the copy of the form to the office and request they complete it while she's there. I will mail the form to her and will follow up on the status of this in about 2 weeks.  Samon Dishner L. Cordai Rodrigue, AAS [facility] Care Management Assistant

## 2015-06-12 ENCOUNTER — Encounter: Payer: Self-pay | Admitting: *Deleted

## 2015-06-22 ENCOUNTER — Other Ambulatory Visit: Payer: Self-pay | Admitting: *Deleted

## 2015-06-22 NOTE — Patient Outreach (Signed)
[facility]) Care Management   06/22/2015  Morgan Hays 1940/11/13 [id number]  Morgan Hays is an 74 y.o. female  Subjective: " I am doing better, I just finished up another course of antibiotics.  Objective:   Review of Systems  Constitutional: Negative.  Negative for fever.  HENT: Negative.  Negative for sore throat.   Respiratory: Negative for cough, sputum production, shortness of breath and wheezing.   Cardiovascular: Negative for chest pain.  Genitourinary: Negative.   Musculoskeletal: Positive for back pain and neck pain.  Skin: Negative.   Psychiatric/Behavioral: Positive for depression. Negative for suicidal ideas.   BP 140/70 mmHg  Pulse 98  Resp 20  SpO2 96% Physical Exam  Constitutional: She is oriented to person, place, and time. She appears well-developed and well-nourished.  Cardiovascular: Normal rate and normal heart sounds.   Respiratory: Effort normal. No respiratory distress. She has no wheezes.  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  Neurological: She is alert and oriented to person, place, and time.  Skin: Skin is warm and dry.  Psychiatric: She has a normal mood and affect. Her behavior is normal. Judgment and thought content normal.    Current Medications:   Current Outpatient Prescriptions  Medication Sig Dispense Refill  . albuterol (PROVENTIL HFA;VENTOLIN HFA) 108 (90 BASE) MCG/ACT inhaler Inhale 1-2 puffs into the lungs every 6 (six) hours as needed for wheezing or shortness of breath. 1 Inhaler 5  . aspirin 81 MG tablet Take 81 mg by mouth daily.      . butalbital-acetaminophen-caffeine (FIORICET WITH CODEINE) 50-325-40-30 MG per capsule Take 1 capsule by mouth every 6 (six) hours as needed.      . Calcium Carbonate (CALCIUM 500 PO) Take 1 capsule by mouth daily.     . calcium carbonate (OS-CAL) 600 MG TABS tablet Take 600 mg by mouth daily with breakfast.    . Cetirizine HCl (ZYRTEC ALLERGY) 10  MG CAPS Take 1 capsule by mouth daily.    . Cholecalciferol (VITAMIN D) 1000 UNITS capsule Take 1,000 Units by mouth daily.      . diclofenac (FLECTOR) 1.3 % PTCH Place 1 patch onto the skin daily as needed. Apply one patch once daily as needed    . diphenhydrAMINE (SOMINEX) 25 MG tablet Take 25 mg by mouth at bedtime as needed for sleep.    . fluticasone (FLONASE) 50 MCG/ACT nasal spray Place 2 sprays into both nostrils daily. 16 g 5  . Fluticasone Furoate-Vilanterol (BREO ELLIPTA) 100-25 MCG/INH AEPB Inhale 1 puff into the lungs daily. (Patient not taking: Reported on 04/20/2015) 60 each 6  . gabapentin (NEURONTIN) 100 MG capsule Take 100 mg by mouth 2 (two) times daily.    [name] ibuprofen (ADVIL,MOTRIN) 200 MG tablet Take 200 mg by mouth every 6 (six) hours as needed.    [name] ipratropium-albuterol (DUONEB) 0.5-2.5 (3) MG/3ML SOLN Take 3 mLs by nebulization every 4 (four) hours as needed.     . Iron-FA-B Cmp-C-Biot-Probiotic (FUSION PLUS) CAPS Take 1 capsule by mouth daily.    [name] levothyroxine (SYNTHROID, LEVOTHROID) 175 MCG tablet Take 137 mcg by mouth daily before breakfast.     . losartan (COZAAR) 100 MG tablet Take 100 mg by mouth daily.    . magnesium oxide (MAG-OX) 400 MG tablet Take 400 mg by mouth daily.      . metFORMIN (GLUCOPHAGE-XR) 500 MG 24 hr tablet 1 tablet daily.    [name] omeprazole (PRILOSEC) 20 MG  capsule Take 1 capsule (20 mg total) by mouth daily. 30 capsule 6  . Oxycodone-Acetaminophen (PERCOCET PO) Take by mouth every 8 (eight) hours as needed.     . pravastatin (PRAVACHOL) 40 MG tablet TAKE 1 TABLET ONCE DAILY. 30 tablet 11  . predniSONE (DELTASONE) 20 MG tablet Take 1 tablet (20 mg total) by mouth daily. 30 tablet 1  . sertraline (ZOLOFT) 50 MG tablet Take 50 mg by mouth 2 (two) times daily. One tab in am and pm    . Spacer/Aero-Holding Chambers (AEROCHAMBER PLUS FLO-VU LARGE) MISC 1 each by Other route once. (Patient not taking: Reported on 04/20/2015) 1 each 0  . tiotropium  (SPIRIVA) 18 MCG inhalation capsule Place 1 capsule (18 mcg total) into inhaler and inhale daily. 30 capsule 11   No current facility-administered medications for this visit.    Functional Status:   In your present state of health, do you have any difficulty performing the following activities: 04/20/2015 12/31/2014  Hearing? Y N  Vision? N N  Difficulty concentrating or making decisions? [name]  Walking or climbing stairs? Y Y  Dressing or bathing? N N  Doing errands, shopping? [name]  Preparing Food and eating ? N N  Using the Toilet? N N  In the past six months, have you accidently leaked urine? Y Y  Do you have problems with loss of bowel control? Y Y  Managing your Medications? N N  Managing your Finances? N N  Housekeeping or managing your Housekeeping? [name]    Fall/Depression Screening:    PHQ 2/9 Scores 03/23/2015 12/31/2014  PHQ - 2 Score 1 1  PHQ- 9 Score 3 3    Assessment:   Routine home visit.  COPD Patient reports one visit to urgent care, since last month due to upper respiratory concerns. Patient states she was unable to get an appointment at office of her PCP, she has completed all doses of Levaquin and reports her breathing is in the green zone on today. Patient continues to wear continuous oxygen and takes all of her medication as prescribed. Mrs.Limb demonstrates knowledge of identifying symptoms and following up appropriately with the action plan. [name] states that she is managing well at home, able to go out to [place] on occasion with her daughter, reports that it really tires her out. resting in between activity, and is able to complete daily chair exercises.    Diabetes [name] continues to check her blood sugar at least 2 times per week,today's blood sugar is 108, and range for the past 2 weeks has been 110-119.  Patient denies having any hypoglycemic episode,she is able to state symptoms and action to take for symptoms or cbg less than 100.       Plan:  Review patient centered goals and update Patient will continue to follow action plan for symptoms at yellow zone Schedule return home visit for January 20,2017  [facility] CM Care Plan Problem One        Most Recent Value   Care Plan Problem One  Recent COPD  exacerbation Hospital  Admissons   Role Documenting the Problem One  Care Management [place] for Problem One  Active   [facility] Long Term Goal (31-90 days)   Patient will not experience Hospital admission in the next 31 days   [facility] Long Term Goal Start Date  12/31/14   [facility] Long Term Goal Met Date  02/11/15   [facility] CM Short Term Goal #  1 (0-30 days)  Patient will be able to report actions to take in the yellow zone    [facility] CM Short Term Goal #1 Start Date  12/31/14   [facility] CM Short Term Goal #1 Met Date  01/27/15   [facility] CM Short Term Goal #2 (0-30 days)  Patient will be able to teach back Green , Yellow and Red COPD zones  in the next 30 days    [facility] CM Short Term Goal #2 Start Date  01/27/15   [facility] CM Short Term Goal #2 Met Date  02/25/15   [facility] CM Short Term Goal #3 (0-30 days)  Patient will report checking her CBG at least weekly   [facility] CM Short Term Goal #3 Start Date  02/25/15   [facility] CM Short Term Goal #3 Met Date  03/23/15    [facility] CM Care Plan Problem Two        Most Recent Value   Care Plan Problem Two  Activity intolerance   Role Documenting the Problem Two  Care Management Coordinator   Care Plan for Problem Two  Active   [facility] Long Term Goal (31-90) days  Patient will report increased activity tolerance   [facility] Long Term Goal Start Date  03/23/15 Barrie Folk not met restarted]   [facility] Long Term Goal Met Date  04/20/15   [facility] CM Short Term Goal #1 (0-30 days)  Patient will report increase in completing sitting exercises daily in the next 30 days   [facility] CM Short Term Goal #1 Start Date  04/20/15 [goal date restarted]   [facility] CM Short Term Goal #1 Met Date   06/22/15   Interventions for Short Term Goal #2   Discussed importance of  exercise on maintaining strenght and balance, reviewed exercise in previous information pack from home health PT   [facility] CM Short Term Goal #2 (0-30 days)  Patient will verbalize how to space activity to avoid over exertion in the next 30 days   [facility] CM Short Term Goal #2 Start Date  06/22/15   Interventions for Short Term Goal #2  Discussed importance of resting between activity such as between eating, bathing, changing ostomy to conserve energy    Joylene Draft, RN, [place] Management [phone]- Mobile [phone]- [place]

## 2015-06-23 NOTE — Patient Outreach (Signed)
[facility]) Care Management  06/23/2015  Humberto LeepJudy Lindahl 03/14/1941 [id number]   Telephone call received from patient to inform me that she took the physician portion of the patient assistance application to Dr. Eliane DecreePatel's office and that he will be back in the office on Friday, 06/26/2015 and the nurse will get him to complete it and call her when it's ready. Once she receives the form back, she will give me a call.  Alonna Bartling L. Shannell Mikkelsen, AAS [facility] Care Management Assistant

## 2015-06-24 ENCOUNTER — Other Ambulatory Visit: Payer: Self-pay | Admitting: *Deleted

## 2015-06-24 MED ORDER — PREDNISONE 20 MG PO TABS: 20.0000 mg | ORAL_TABLET | Freq: Every day | ORAL | Status: DC

## 2015-07-17 ENCOUNTER — Other Ambulatory Visit: Payer: Self-pay | Admitting: *Deleted

## 2015-07-17 DIAGNOSIS — F331 Major depressive disorder, recurrent, moderate: Secondary | ICD-10-CM | POA: Diagnosis not present

## 2015-07-17 DIAGNOSIS — E1140 Type 2 diabetes mellitus with diabetic neuropathy, unspecified: Secondary | ICD-10-CM | POA: Diagnosis not present

## 2015-07-17 DIAGNOSIS — E034 Atrophy of thyroid (acquired): Secondary | ICD-10-CM | POA: Diagnosis not present

## 2015-07-17 DIAGNOSIS — I10 Essential (primary) hypertension: Secondary | ICD-10-CM | POA: Diagnosis not present

## 2015-07-17 DIAGNOSIS — E782 Mixed hyperlipidemia: Secondary | ICD-10-CM | POA: Diagnosis not present

## 2015-07-17 DIAGNOSIS — J449 Chronic obstructive pulmonary disease, unspecified: Secondary | ICD-10-CM | POA: Diagnosis not present

## 2015-07-17 DIAGNOSIS — I712 Thoracic aortic aneurysm, without rupture: Secondary | ICD-10-CM

## 2015-07-17 DIAGNOSIS — I7121 Aneurysm of the ascending aorta, without rupture: Secondary | ICD-10-CM

## 2015-07-17 DIAGNOSIS — E1142 Type 2 diabetes mellitus with diabetic polyneuropathy: Secondary | ICD-10-CM | POA: Diagnosis not present

## 2015-07-24 ENCOUNTER — Other Ambulatory Visit: Payer: Self-pay | Admitting: *Deleted

## 2015-07-24 NOTE — Patient Outreach (Signed)
[facility]) Care Management   07/24/2015  Morgan Hays Aug 16, 1940 [id number]  Morgan Hays is an 75 y.o. female  Subjective: " I doing pretty good" Patient discussed the swelling that she has in her legs and her recent visit to PCP.  Objective:   Review of Systems  Constitutional: Negative.   HENT: Negative.   Cardiovascular: Positive for leg swelling. Negative for chest pain.       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 Negative for falls.  Skin: Negative.   Neurological: Negative.   Psychiatric/Behavioral: Positive for depression. The patient is nervous/anxious.        Reports having problems with remembering.  BP 122/60 mmHg  Pulse 96  Resp 20  SpO2 98%  Physical Exam  Constitutional: She is oriented to person, place, and time. She appears well-developed and well-nourished.  Cardiovascular: Normal rate, normal heart sounds and intact distal pulses.   Respiratory: Effort normal. No respiratory distress. She has decreased breath sounds in the right lower field and the left lower field.  GI: Soft.  Musculoskeletal: She exhibits edema.  Neurological: She is alert and oriented to person, place, and time.  Skin: Skin is warm, dry and intact.     Psychiatric: She has a normal mood and affect. Her behavior is normal. Judgment and thought content normal.    Current Medications:   Current Outpatient Prescriptions  Medication Sig Dispense Refill  . albuterol (PROVENTIL HFA;VENTOLIN HFA) 108 (90 BASE) MCG/ACT inhaler Inhale 1-2 puffs into the lungs every 6 (six) hours as needed for wheezing or shortness of breath. 1 Inhaler 5  . aspirin 81 MG tablet Take 81 mg by mouth daily.      . butalbital-acetaminophen-caffeine (FIORICET WITH CODEINE) 50-325-40-30 MG per capsule Take 1 capsule by mouth every 6 (six) hours as needed.      . Calcium Carbonate (CALCIUM 500 PO) Take 1  capsule by mouth daily. Reported on 06/22/2015    . Cetirizine HCl (ZYRTEC ALLERGY) 10 MG CAPS Take 1 capsule by mouth daily.    . Cholecalciferol (VITAMIN D) 1000 UNITS capsule Take 1,000 Units by mouth daily.      . clonazePAM (KLONOPIN) 1 MG tablet Take 1 mg by mouth daily as needed for anxiety. Take one tablet daily as needed    . diphenhydrAMINE (SOMINEX) 25 MG tablet Take 25 mg by mouth at bedtime as needed for sleep.    . fluticasone (FLONASE) 50 MCG/ACT nasal spray Place 2 sprays into both nostrils daily. 16 g 5  . furosemide (LASIX) 20 MG tablet Take 20 mg by mouth. Take one tablet once daily as needed for swelling    . gabapentin (NEURONTIN) 100 MG capsule Take 300 mg by mouth 2 (two) times daily.     [name] ipratropium-albuterol (DUONEB) 0.5-2.5 (3) MG/3ML SOLN Take 3 mLs by nebulization every 4 (four) hours as needed.     . Iron-FA-B Cmp-C-Biot-Probiotic (FUSION PLUS) CAPS Take 1 capsule by mouth daily.    [name] levothyroxine (SYNTHROID, LEVOTHROID) 175 MCG tablet Take 137 mcg by mouth daily before breakfast.     . losartan (COZAAR) 100 MG tablet Take 100 mg by mouth daily.    . magnesium oxide (MAG-OX) 400 MG tablet Take 400 mg by mouth daily.      . metFORMIN (GLUCOPHAGE-XR) 500 MG 24 hr tablet 1 tablet daily.    [name] omeprazole (PRILOSEC)  20 MG capsule Take 1 capsule (20 mg total) by mouth daily. 30 capsule 6  . Oxycodone-Acetaminophen (PERCOCET PO) Take by mouth every 8 (eight) hours as needed.     . pravastatin (PRAVACHOL) 40 MG tablet TAKE 1 TABLET ONCE DAILY. 30 tablet 11  . predniSONE (DELTASONE) 20 MG tablet Take 1 tablet (20 mg total) by mouth daily. 30 tablet 1  . sertraline (ZOLOFT) 50 MG tablet Take 50 mg by mouth 2 (two) times daily. One tab in am and pm    . tiotropium (SPIRIVA) 18 MCG inhalation capsule Place 1 capsule (18 mcg total) into inhaler and inhale daily. 30 capsule 11  . calcium carbonate (OS-CAL) 600 MG TABS tablet Take 600 mg by mouth daily with breakfast. Reported on  07/24/2015    . diclofenac (FLECTOR) 1.3 % PTCH Place 1 patch onto the skin daily as needed. Reported on 07/24/2015    . Fluticasone Furoate-Vilanterol (BREO ELLIPTA) 100-25 MCG/INH AEPB Inhale 1 puff into the lungs daily. (Patient not taking: Reported on 04/20/2015) 60 each 6  . ibuprofen (ADVIL,MOTRIN) 200 MG tablet Take 200 mg by mouth every 6 (six) hours as needed. Reported on 07/24/2015    . Spacer/Aero-Holding Chambers (AEROCHAMBER PLUS FLO-VU LARGE) MISC 1 each by Other route once. (Patient not taking: Reported on 04/20/2015) 1 each 0   No current facility-administered medications for this visit.    Functional Status:   In your present state of health, do you have any difficulty performing the following activities: 07/24/2015 04/20/2015  Hearing? N Y  Vision? N N  Difficulty concentrating or making decisions? [name]  Walking or climbing stairs? Y Y  Dressing or bathing? N N  Doing errands, shopping? [name]  Preparing Food and eating ? N N  Using the Toilet? N N  In the past six months, have you accidently leaked urine? Y Y  Do you have problems with loss of bowel control? Y Y  Managing your Medications? N N  Managing your Finances? N N  Housekeeping or managing your Housekeeping? [name]    Fall/Depression Screening:    [employer] 2/9 Scores 07/24/2015 06/22/2015 03/23/2015 12/31/2014  PHQ - 2 Score '2 2 1 1  ' PHQ- 9 Score '8 5 3 3    ' Assessment:   Patient continues to  follow up with PCP regularly. Denies having a recent COPD flare. Patient reports recent increase in anxiety, nervousness and klonopin has been added by her PCP, patient reports that it has helped.  Lower leg swelling Patient reports concern regarding increased swelling in her lower legs 2+ edema, she has been started on lasix  Daily prn for leg swelling patient states that she taken it a few times.. Discussed importance of monitoring the amount of salt in diet, discussed food types to limit. Patient has scales to weigh on encouraged  to weigh daily and record and encouraged to notify MD of increased swelling or weight gain or no improvement in swelling.  Encouraged patient to elevate her legs when resting in bed during the day.  COPD Reports her breathing is in the green zone, denies having episode of COPD flare since last visit. Wearing continuous oxygen at 4 liters, and taking respiratory medications as prescribed, she reports that she uses her albuterol inhaler once daily , 5 days a week with relief.   Fall Denies having a recent fall, continues to use her rolling walker at all times  Diabetes: Patient continues to check blood sugar at least weekly  varying between am and pm. Her lowest blood sugar has been 98 and highest 168 in the pm. Patient reports that her most recent A1c results where good, less than 7 at MD appointment on last week.  Chronic back pain Pain reports that her pain is at a 5 level and tolerable, she only takes percocet about once daily, and pain medication being managed by her PCP.  Plan:  Patient will begin to weigh daily and record and notify MD of weight gains greater than 1 pound in a day and 5 in a week Review patient centered goals and progress and case closure to community care manager as needs are being met, will reevaluate patient state  at next visit patient in agreement   [facility] CM Care Plan Problem One        Most Recent Value   Care Plan Problem One  Recent COPD  exacerbation Hospital  Admissons   Role Documenting the Problem One  Care Management [place] for Problem One  Active   [facility] Long Term Goal (31-90 days)   Patient will not experience Hospital admission in the next 31 days   [place] Term Goal Start Date  12/31/14   [facility] Long Term Goal Met Date  02/11/15   [facility] CM Short Term Goal #1 (0-30 days)  Patient will be able to report actions to take in the yellow zone    [facility] CM Short Term Goal #1 Start Date  12/31/14   [facility] CM Short Term Goal #1 Met Date  01/27/15   [facility] CM  Short Term Goal #2 (0-30 days)  Patient will be able to teach back Green , Yellow and Red COPD zones  in the next 30 days    [facility] CM Short Term Goal #2 Start Date  01/27/15   [facility] CM Short Term Goal #2 Met Date  02/25/15   [facility] CM Short Term Goal #3 (0-30 days)  Patient will report checking her CBG at least weekly   [facility] CM Short Term Goal #3 Start Date  02/25/15   [facility] CM Short Term Goal #3 Met Date  03/23/15   [facility] CM Short Term Goal #4 (0-30 days)  Patient will report weighing daily and recording in the next 30 days   [facility] CM Short Term Goal #4 Start Date  07/24/15   Interventions for Short Term Goal #4  Discussed importance of tracking daily weights, when on fluid pills, and monitoring weight gain and loss,and notifying MD of continued swelling and weight gain greater than 1 pound  in a day and 5 pounds in a week    [facility] CM Care Plan Problem Two        Most Recent Value   Care Plan Problem Two  Activity intolerance   Role Documenting the Problem Two  Care Management Coordinator   Care Plan for Problem Two  Active   [facility] Long Term Goal (31-90) days  Patient will report increased activity tolerance   [facility] Long Term Goal Start Date  03/23/15 Barrie Folk not met restarted]   [facility] Long Term Goal Met Date  04/20/15   [facility] CM Short Term Goal #1 (0-30 days)  Patient will report increase in completing sitting exercises daily in the next 30 days   [facility] CM Short Term Goal #1 Start Date  04/20/15 [goal date restarted]   [facility] CM Short Term Goal #1 Met Date   06/22/15   [facility] CM Short Term Goal #2 (0-30  days)  Patient will verbalize how to space activity to avoid over exertion in the next 30 days   [facility] CM Short Term Goal #2 Start Date  06/22/15   [facility] CM Short Term Goal #2 Met Date  07/24/15   Interventions for Short Term Goal #2  Discussed importance of resting between activity such as between eating, bathing, changing ostomy to conserve energy    Joylene Draft, RN, [place] Management [phone]-  Mobile [phone]- [place]

## 2015-08-04 ENCOUNTER — Ambulatory Visit (INDEPENDENT_AMBULATORY_CARE_PROVIDER_SITE_OTHER): Payer: Medicare Other | Admitting: Emergency Medicine

## 2015-08-04 ENCOUNTER — Encounter: Payer: Self-pay | Admitting: Emergency Medicine

## 2015-08-04 VITALS — BP 130/60 | HR 105 | Ht 65.0 in | Wt 180.0 lb

## 2015-08-04 DIAGNOSIS — J449 Chronic obstructive pulmonary disease, unspecified: Secondary | ICD-10-CM

## 2015-08-04 DIAGNOSIS — J309 Allergic rhinitis, unspecified: Secondary | ICD-10-CM

## 2015-08-04 NOTE — Patient Instructions (Signed)
Please continue Spiriva once a day Use DuoNeb as needed for shortness of breath Continue oxygen at all times Continue prednisone 20 mg daily Continue fluticasone nasal spray Stop Zyrtec Start Allegra 180 mg once a day Try using chlorpheniramine 4 mg up to every 6 hours if needed for allergy symptoms and congestion Try increasing the frequency of your nasal saline washes Follow with Dr Delton Coombes in 4 months or sooner if you have any problems.

## 2015-08-04 NOTE — Progress Notes (Signed)
Morgan Hays is a 75 year old woman who follows up today for her COPD, dyspnea, and for a history of a RLL pulmonary nodule. 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 No sgy has been planned due to her significant lung disease. Also with hx depression.   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 Could not tolerate Daliresp. She has been rx with pred + abx x 1 since last visit by PCP. Currently at baseline, O2 is set on 3L/min. She does have some wheezing. Taking loratadine and fluticasone every day.   ROV 07/26/11 -- Severe COPD on O2, chronic cough being rx for allergies and GERD. She is here for regular f/u. She mentions today that she is having some cyanosis, coolness and numbness in her feet for the last 5 -6 months. No w/u done yet, but she is going to mention to her PCP. Breathing has been stable, no flares since last visit. Taking Spiriva + Symbicort + SABA; uses albuterol at least once a day.   ROV 10/31/11 -- Severe COPD on O2, chronic cough being rx for allergies and GERD. Having more nasal sx, gtt, wheezing.  She has not had flare since last time.  Interested in Research scientist (medical).   ROV 02/01/12 -- Severe COPD on O2, chronic cough being rx for allergies and GERD. Since last visit she has undergone Myoview for atypical CP, reassuring study. 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 CT chest > no evidence PE.  Breathing has been stable, still limiting her exertion. Rarely uses SABA.  Spiriva + Symbicort, O2 on 3L/min. She believes that stopping cymbalta has helped her. Some occasional cough.   ROV 06/05/12 -- Severe COPD on O2, chronic cough being rx for allergies and GERD.  CAT score today 23. Spiriva + Symbicort, O2 on 3L/min.  Reports today that her energy is very low, not sure that it is worsening of breathing. She is using albuterol about every day. She had the flu shot. She had prednisone/abx in September. Went to ED in august  after a fall.   ROV 09/21/12 -- Severe COPD on O2, chronic cough being rx for allergies and GERD.  Her breathing is very limiting. She has been rx with pred x 2 since last time.   ROV 10/22/12 -- Severe COPD on O2, chronic cough being rx for allergies and GERD. Returns today reporting worsening dyspnea. We started daily pred a month ago to see if she would benefit. She can tell a difference at rest and w sleeping. No real difference in exertional SOB  ROV 11/21/12 -- Severe COPD, hypoxemia, chronic cough, GERD, allergic chronic rhinitis. We have initiated chronic pred, started tapering last visit, currently 15. Since last time she was evaluated by [name] for increased exertional SOB, overall malaise, was sent for CT chest as below > no PE, no infiltrates, stable emphysema. Also had thyroid studies, showed high TSH and synthroid increased. Her breathing is Ok at rest, worse with exertion.  Using duonebs 2x a day, spiriva + symbicort.   ROV 01/02/13 -- Severe COPD, hypoxemia, chronic cough, GERD, allergic chronic rhinitis. Her chronic pred is at  (increased last time). She doesn't notice any real change in breathing. Cough is stable. She wheezes with exertion.    ROV 02/22/13 -- Severe COPD, hypoxemia, chronic cough, GERD, allergic chronic rhinitis, chronic pred 20. She presents today with about 3 weeks of increased DOE, some increased LE edema. No  change in cough or wheeze. Has to rest with 20 ft. Using duonebs 3-4x a day. O2 on 4L/min pulsed (continuous at home). Her wt is stable.   ROV 03/27/13 -- Severe COPD, hypoxemia, chronic cough, GERD, allergic chronic rhinitis, chronic pred 20. Last time we tried changing symbicort to [facility] to see if she would benefit.   ROV 06/21/13 -- Severe COPD, hypoxemia, chronic cough, GERD, allergic chronic rhinitis, chronic pred 20. She unfortunately has been dealing with with several problems > anemia, abnormal thyroid testing, has had falls since last time. She is to  see NSGY for newly identified aneurysm. She feel like she loses power in her legs, not really a balance problem. He O2 is set on 3-4L/min. Happens going from seated to standing position. She has had some disorientation, ? Related to pred.   ROV 10/23/13 -- severe COPD, hypoxemia, chronic cough. Chronic pred 20. Seen by Dr Sherene Sires 4/1 for mid-back pain. Still happens intermittently.  She is taking benadryl and zyrtec. She continues to have exertional SOB. Also some instability. She is on breo + Spiriva.   ROV 12/13/13 -- severe COPD on pred 20, hypoxemia, chronic cough. She unfortunately experienced a ruptured divertic, required ileostomy. She was readmitted for hypercapneic resp failure and required BiPAP temporarily. She is now back at home.  She wonders about whether she needs BiPAP.   ROV 02/25/14 -- follow up for severe COPD, hypoxemia, chronic cough. She is on Breo + Spiriva. She uses duonebs 1-2x a day. Her O2 is on 3 pulsed w exertion.  Her stable Pred dose is . She is wondering if she can have her ileostomy reversed - but she acknowledges that it would be high risk. No flares, no new meds. Sx are stable.   ROV 06/03/14 -- Follow-up visit for COPD, interstitial lung disease, hypoxemia. She remains on Breo, Spiriva and prednisone 20 mg daily. She is quite limited with her daily activities, is stable at rest. We talked today about her life expectancy, the risks of CPR or mechanical ventilation. She underscored  her desire to be DO NOT RESUSCITATE.   ROV 10/10/14 -- follow-up visit for hypoxemic respiratory failure in the setting of interstitial lung disease and COPD. She's currently managed on prednisone 20 mg daily as well as Spiriva and Breo. She is doing fairly well. She has not had any hospitalizations, but she did go to urgent care and was treated for sinusitis.    Post Hospital follow up 11/09/14 Pt returns for a post hospital follow up  Recently admitted to [facility] ? Reason .  Records  requested.  Says she is feeling better .  Breathing is at baseline with no flare of cough, dyspnea .  Does have back pain with recently dx compression fx Seeing ortho in [place] .  Very upset about pain and tx options.  No chest pain, orthopnea, edema or fever Steroid education given .    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 She was hospitalized in April for AE-COPD, hypercapnia.  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 She is supposed to have an epidural injection soon. Wearing 3-4L/min. Using breo and spiriva, SABA prn.   ROV 03/31/15 -- follow-up visit for chronic hypoxemia, interstitial lung disease, COPD. She is receiving percocet prn for her back pain. Has not received injections since last time. She is on  pred 20, spiriva, breo, duoneb prn. She uses about 1-2x a day. Her O2 is at . She is unclear that she has benefited from breo. She believes that she is a bit worse.  She had an episode of acute resp failure that landed her at [facility]. They tried to use BiPAP and she refused it.   ROV1/31/17 -- his follow-up visit for interstitial lung disease and COPD with associated chronic hypoxemic respiratory failure. We've managed her on chronic prednisone currently at 20 mg daily. She also takes Spiriva, duoneb prn.  Last time we stopped Breo to see if she tolerated. She has been having breakthrough sinus drainage, nasal congestion.  She has tolerated off Breo. She uses duoneb about bid. She wonders if zyrtec is still effective.      EXAM  Filed Vitals:   08/04/15 1410  BP: 130/60  Pulse: 105  Height:  (1.651 m)  Weight: 180 lb (81.647 kg)  SpO2: 96%    Gen: Pleasant, obese, in no distress, elderly and chronically ill appearing   ENT: No lesions,  mouth clear,  oropharynx clear, no postnasal drip  Neck: No JVD, no TMG, no carotid bruits  Lungs: Distant  BS w/  no wheezing, very distant  Cardiovascular: RRR, heart sounds normal, no murmur or gallops, trace edema  Musculoskeletal: No deformities, no cyanosis or clubbing  Neuro: alert, non focal  Skin: Warm, no lesions or rashes   12/19/11 Myoview:  Overall Impression: Normal stress nuclear study with a small, mild, fixed apical defect consistent with thinning; no ischemia.  LV Ejection Fraction: 79%. LV Wall Motion: NL LV Function; NL Wall Motion  CT chest 11/06/12 >>  No PE, centra-lobular emphysema, no infiltrates or nodules, stable thoracic aortic aneurysm   Allergic rhinitis Continue prednisone 20 mg daily Continue fluticasone nasal spray Stop Zyrtec Start Allegra 180 mg once a day Try using chlorpheniramine 4 mg up to every 6 hours if needed for allergy symptoms and congestion Try increasing the frequency of your nasal saline washes Follow with Dr Delton Coombes in 4 months or sooner if you have any problems.  COPD (chronic obstructive pulmonary disease) Please continue Spiriva once a day Use DuoNeb as needed for shortness of breath Continue oxygen at all times Continue prednisone 20 mg daily Follow with Dr Delton Coombes in 4 months or sooner if you have any problems.

## 2015-08-04 NOTE — Assessment & Plan Note (Signed)
Please continue Spiriva once a day Use DuoNeb as needed for shortness of breath Continue oxygen at all times Continue prednisone 20 mg daily Follow with Dr Delton Coombes in 4 months or sooner if you have any problems.

## 2015-08-04 NOTE — Assessment & Plan Note (Signed)
Continue prednisone 20 mg daily Continue fluticasone nasal spray Stop Zyrtec Start Allegra 180 mg once a day Try using chlorpheniramine 4 mg up to every 6 hours if needed for allergy symptoms and congestion Try increasing the frequency of your nasal saline washes Follow with Dr Delton Coombes in 4 months or sooner if you have any problems.

## 2015-08-21 ENCOUNTER — Other Ambulatory Visit: Payer: Self-pay | Admitting: *Deleted

## 2015-08-21 ENCOUNTER — Encounter: Payer: Self-pay | Admitting: *Deleted

## 2015-08-21 NOTE — Patient Outreach (Signed)
[facility]) Care Management   08/21/2015  Morgan Hays [birth year] [id number]  Morgan Hays is an 75 y.o. female  Subjective: I am having a okay day"  Objective:  BP 130/78 mmHg  Pulse 98  Resp 20  SpO2 98% Review of Systems  Constitutional: Negative.   HENT: Negative.   Eyes: Negative.   Respiratory: Positive for cough and sputum production. Negative for wheezing.        Yellow sputum, no increase  Cardiovascular: Negative for chest pain.  Gastrointestinal:       Ileostomy  Genitourinary: Negative.   Musculoskeletal: Positive for back pain. Negative for falls.  Skin: Negative.   Neurological: Negative.   Psychiatric/Behavioral: Negative for depression.    Physical Exam  Constitutional: She is oriented to person, place, and time. She appears well-developed and well-nourished.  Cardiovascular: Normal rate, normal heart sounds and intact distal pulses.   Respiratory: She has no rales.  GI: Soft.  Neurological: She is alert and oriented to person, place, and time.  Skin: Skin is warm and dry.     Psychiatric: She has a normal mood and affect. Her behavior is normal. Judgment and thought content normal.    Current Medications:   Current Outpatient Prescriptions  Medication Sig Dispense Refill  . albuterol (PROVENTIL HFA;VENTOLIN HFA) 108 (90 BASE) MCG/ACT inhaler Inhale 1-2 puffs into the lungs every 6 (six) hours as needed for wheezing or shortness of breath. 1 Inhaler 5  . aspirin 81 MG tablet Take 81 mg by mouth daily.      . butalbital-acetaminophen-caffeine (FIORICET WITH CODEINE) 50-325-40-30 MG per capsule Take 1 capsule by mouth every 6 (six) hours as needed.      . Calcium Carbonate (CALCIUM 500 PO) Take 1 capsule by mouth daily. Reported on 06/22/2015    . calcium carbonate (OS-CAL) 600 MG TABS tablet Take 600 mg by mouth daily with breakfast. Reported on 07/24/2015    . Cholecalciferol (VITAMIN D) 1000 UNITS capsule Take 1,000 Units by mouth  daily.      . clonazePAM (KLONOPIN) 1 MG tablet Take 1 mg by mouth daily as needed for anxiety. Take one tablet daily as needed    . diphenhydrAMINE (SOMINEX) 25 MG tablet Take 25 mg by mouth at bedtime as needed for sleep.    . fexofenadine (ALLEGRA) 180 MG tablet Take 180 mg by mouth daily.    . fluticasone (FLONASE) 50 MCG/ACT nasal spray Place 2 sprays into both nostrils daily. 16 g 5  . furosemide (LASIX) 20 MG tablet Take 20 mg by mouth. Take one tablet once daily as needed for swelling    . gabapentin (NEURONTIN) 100 MG capsule Take 300 mg by mouth 2 (two) times daily.     [name] ipratropium-albuterol (DUONEB) 0.5-2.5 (3) MG/3ML SOLN Take 3 mLs by nebulization every 4 (four) hours as needed.     . Iron-FA-B Cmp-C-Biot-Probiotic (FUSION PLUS) CAPS Take 1 capsule by mouth daily.    [name] levothyroxine (SYNTHROID, LEVOTHROID) 175 MCG tablet Take 137 mcg by mouth daily before breakfast.     . losartan (COZAAR) 100 MG tablet Take 100 mg by mouth daily.    . magnesium oxide (MAG-OX) 400 MG tablet Take 400 mg by mouth daily.      [name] omeprazole (PRILOSEC) 20 MG capsule Take 1 capsule (20 mg total) by mouth daily. 30 capsule 6  . Oxycodone-Acetaminophen (PERCOCET PO) Take by mouth every 8 (eight) hours as needed.     . pravastatin (PRAVACHOL)  40 MG tablet TAKE 1 TABLET ONCE DAILY. 30 tablet 11  . predniSONE (DELTASONE) 20 MG tablet Take 1 tablet (20 mg total) by mouth daily. 30 tablet 1  . sertraline (ZOLOFT) 50 MG tablet Take 50 mg by mouth 2 (two) times daily. One tab in am and pm    . tiotropium (SPIRIVA) 18 MCG inhalation capsule Place 1 capsule (18 mcg total) into inhaler and inhale daily. 30 capsule 11  . Cetirizine HCl (ZYRTEC ALLERGY) 10 MG CAPS Take 1 capsule by mouth daily. Reported on 08/21/2015    . diclofenac (FLECTOR) 1.3 % PTCH Place 1 patch onto the skin daily as needed. Reported on 08/21/2015    . Fluticasone Furoate-Vilanterol (BREO ELLIPTA) 100-25 MCG/INH AEPB Inhale 1 puff into the lungs  daily. (Patient not taking: Reported on 08/21/2015) 60 each 6  . ibuprofen (ADVIL,MOTRIN) 200 MG tablet Take 200 mg by mouth every 6 (six) hours as needed. Reported on 08/21/2015    . metFORMIN (GLUCOPHAGE-XR) 500 MG 24 hr tablet 1 tablet daily.    [name] Spacer/Aero-Holding Chambers (AEROCHAMBER PLUS FLO-VU LARGE) MISC 1 each by Other route once. (Patient not taking: Reported on 08/21/2015) 1 each 0   No current facility-administered medications for this visit.    Functional Status:   In your present state of health, do you have any difficulty performing the following activities: 07/24/2015 04/20/2015  Hearing? N Y  Vision? N N  Difficulty concentrating or making decisions? [name]  Walking or climbing stairs? Y Y  Dressing or bathing? N N  Doing errands, shopping? [name]  Preparing Food and eating ? N N  Using the Toilet? N N  In the past six months, have you accidently leaked urine? Y Y  Do you have problems with loss of bowel control? Y Y  Managing your Medications? N N  Managing your Finances? N N  Housekeeping or managing your Housekeeping? [name]   Fall Risk  07/24/2015 03/23/2015 01/27/2015 12/31/2014  Falls in the past year? Yes Yes Yes Yes  Number falls in past yr: _0 Injury with Fall? No No No -  Risk for fall due to : History of fall(s);Impaired balance/gait History of fall(s);Impaired balance/gait History of fall(s);Impaired balance/gait Impaired balance/gait;History of fall(s);Impaired mobility  Risk for fall due to (comments): - - uses rolling walker -  Follow up Falls evaluation completed Falls evaluation completed Falls evaluation completed Education provided    Fall/Depression Screening:    PHQ 2/9 Scores 07/24/2015 06/22/2015 03/23/2015 12/31/2014  PHQ - 2 Score _1 PHQ- 9 Score _2 Assessment:  Routine home visit, patient  Resting in her hover wheelchair, Patient's daughter is home on today. Patient reports continued chronic back pain, states pain at her tolerable  goal level with occasional prn oxycodone.   COPD-  Patient states she is in the green zone on today, continues wearing her oxygen at 3 liters,denies any worsen copd symptoms and verbalizes understanding of symptoms to notify MD of. Patient reports some improvement with sinus problem since  switching to allegra and using the netipot as she states she discussed with her pulmonologist.    Diabetes Patient has monitored her blood sugars daily and recorded, lowest reading 106 and highest 167. Patient states she eats well including a balance of proteins, vegetables and starches , states she eats sweets each day and will continue to.  Lower leg swelling Patient continues to have lower  leg edema, taking her lasix daily, and weighing daily without weight increases, weighs 175 to 176. Educated patient on limiting salt in diet. Encouraged patient to elevate legs throughout the day.  Patient showed me a letter from her insurance company, indicating that her Fioricet was not on the formulary for this year, patient states she uses it about 3 times a month and has a good supply and it works for her and she wants to continue to use it if possible. I placed a call to her PCP office to discuss contents of letter and patient's request with them and had to leave a voice message.  Plan:  I will follow up with [name] regarding patient assistance with [name] for back pain I will follow up with patient in 2 weeks for transition to health coach for disease management as patient centered goals being met, patient agreeable.  Joylene Draft, RN, [facility] [phone]- Mobile [phone]- Toll Free Main Office

## 2015-08-27 ENCOUNTER — Encounter: Payer: Medicare Other | Admitting: Cardiothoracic Surgery

## 2015-08-27 ENCOUNTER — Other Ambulatory Visit: Payer: Medicare Other

## 2015-08-28 ENCOUNTER — Ambulatory Visit (INDEPENDENT_AMBULATORY_CARE_PROVIDER_SITE_OTHER): Payer: Medicare Other | Admitting: Cardiothoracic Surgery

## 2015-08-28 ENCOUNTER — Encounter: Payer: Self-pay | Admitting: Cardiothoracic Surgery

## 2015-08-28 ENCOUNTER — Ambulatory Visit
Admission: RE | Admit: 2015-08-28 | Discharge: 2015-08-28 | Disposition: A | Discharge status: Home / Self Care | Payer: Medicare Other | Source: Ambulatory Visit | Attending: Cardiothoracic Surgery | Admitting: Cardiothoracic Surgery

## 2015-08-28 VITALS — BP 108/58 | HR 96 | Resp 18 | Ht 65.0 in | Wt 175.0 lb

## 2015-08-28 DIAGNOSIS — I77810 Thoracic aortic ectasia: Secondary | ICD-10-CM

## 2015-08-28 DIAGNOSIS — I712 Thoracic aortic aneurysm, without rupture: Secondary | ICD-10-CM

## 2015-08-28 DIAGNOSIS — I7121 Aneurysm of the ascending aorta, without rupture: Secondary | ICD-10-CM

## 2015-08-28 NOTE — Progress Notes (Signed)
Patient ID: Morgan Hays, female   DOB: [birth year], 75 y.o.   MRN: [id number]                                             [facility] Medical Record #[id number] Date of Birth: [birth year]  Referring: Leslye Peer, MD Primary Care: [facility] Pulmonology: Dr. Delton Coombes Cardiology: Dr. Jens Som  Chief Complaint:    Ascending aortic aneurysm   History of Present Illness:     Patient returns with followup CTs of the chest to evaluate the size of her dilated ascending and descending aorta. She is followed both by pulmonology and cardiology. The patient remains very limited on home oxygen, and very sedentary. Since last seen the patient appears to be in more frail than when I last saw her. Since she was last seen she developed a perforated colonic diverticulum and had emergency bowel surgery including colostomy. She is unaware of any changes in her renal function, however on labs prior to the CT today her creatinine was noted to be elevated to 1.8 previous years have seen her her creatinines run between 0.6 and 0.9. She is on nonsteroidal oral anti-inflammatories and a ARB she does talk to [facility] about it yet 1 more told her that I told her to start told the story that  Current Activity/ Functional Status: Patient is very limited in her functional ability notes that she becomes short of breath walking less than 100 feet. His generalized weakness. Notes it should become short of breath just sweeping the floor in her house and is not engaged in much physical activity primarily secondary to shortness of breath.   Zubrod Score: At the time of surgery this patient's most appropriate activity status/level should be described as:     0    Normal activity, no symptoms     1    Restricted in physical strenuous activity but ambulatory, able to do out light work     2    Ambulatory and capable of self care, unable to do work activities, up and about >50 % of waking hours                                   3    Only limited self care, in bed greater than 50% of waking hours     4    Completely disabled, no self care, confined to bed or chair     5    Moribund   Past Medical History  Diagnosis Date  . Supraventricular tachycardia (HCC)   . MVP (mitral valve prolapse)   . Hypothyroid   . Hypertension   . Aortic aneurysm (HCC)   . Pulmonary nodule   . Varicose vein   . Arthritis   . Depression   . COPD (chronic obstructive pulmonary disease) (HCC)   . CAD (coronary artery disease)   . Allergy   . Anemia   . Anxiety   . Blood transfusion without reported diagnosis   . Diabetes mellitus without complication (HCC)   . Cataract     Past Surgical History  Procedure Laterality Date  . Appendectomy  1956  . Total abdominal hysterectomy  1975  . Tubal ligation  1972  . Breast surgery      benign left  breast cyst removed    History  Smoking status  . Former Smoker -- 1.00 packs/day for 50 years  . Types: Cigarettes  . Quit date: 07/05/1999  Smokeless tobacco  . Never Used    History  Alcohol Use No    History   Social History  . Marital Status: Married    Spouse Name: N/A    Number of Children: N/A  . Years of Education: N/A   Occupational History  . LPN    Social History Main Topics  . Smoking status: Former Smoker -- 1.0 packs/day for 50 years    Types: Cigarettes    Quit date: 07/05/1999  . Smokeless tobacco: Not on file  . Alcohol Use: Not on file  . Drug Use: Not on file  . Sexually Active: Not on file    Social History Narrative   Pt is married and lives with her husband. She works as an Public house manager.  She has not had any known TB exposures.  She is a former smoker.  Quit in 2001.  She has approx 50 pack year total history.  Does not use alcohol     Allergies  Allergen Reactions  . Ambien [Zolpidem Tartrate]   . Cymbalta [Duloxetine Hcl]     Intolerance   . Daliresp [Roflumilast] Diarrhea  . Doxycycline Nausea And Vomiting    . Lisinopril   . Lopressor [Metoprolol Tartrate]     Intolerance     Current Outpatient Prescriptions  Medication Sig Dispense Refill  . albuterol (PROVENTIL HFA) 108 (90 BASE) MCG/ACT inhaler Inhale 2 puffs into the lungs every 6 (six) hours as needed.        [name] albuterol (PROVENTIL) (2.5 MG/3ML) 0.083% nebulizer solution Take 2.5 mg by nebulization 2 (two) times daily.        [name] aspirin 81 MG tablet Take 81 mg by mouth daily.        . budesonide-formoterol (SYMBICORT) 160-4.5 MCG/ACT inhaler Inhale 2 puffs into the lungs 2 (two) times daily.        . butalbital-acetaminophen-caffeine (FIORICET WITH CODEINE) 50-325-40-30 MG per capsule Take 1 capsule by mouth every 6 (six) hours as needed.        . Calcium Carbonate (CALCIUM 500 PO) Take 1 capsule by mouth 2 (two) times daily.        . Cholecalciferol (VITAMIN D) 1000 UNITS capsule Take 1,000 Units by mouth daily.        . fluticasone (FLONASE) 50 MCG/ACT nasal spray Place 2 sprays into the nose daily.  16 g  5  . levothyroxine (SYNTHROID, LEVOTHROID) 125 MCG tablet Take 125 mcg by mouth daily.        [name] loratadine (CLARITIN) 10 MG tablet Take 10 mg by mouth daily.        [name] LORazepam (ATIVAN) 1 MG tablet Take 1 tablet (1 mg total) by mouth 2 (two) times daily.  15 tablet  0  . magnesium oxide (MAG-OX) 400 MG tablet Take 400 mg by mouth daily.        [name] olmesartan-hydrochlorothiazide (BENICAR HCT) 40-12.5 MG per tablet Take 1 tablet by mouth daily.  30 tablet  12  . omeprazole (PRILOSEC) 20 MG capsule Take 1 capsule (20 mg total) by mouth daily.  30 capsule  5  . ondansetron (ZOFRAN) 4 MG tablet Take 1 tablet (4 mg total) by mouth every 8 (eight) hours as needed for nausea.  20 tablet  0  . pravastatin (PRAVACHOL) 40 MG tablet  Take 40 mg by mouth daily.        . sertraline (ZOLOFT) 50 MG tablet Take 100 mg by mouth 2 (two) times daily.       [name] tiotropium (SPIRIVA) 18 MCG inhalation capsule Place 18 mcg into inhaler and inhale daily.        .  traZODone (DESYREL) 50 MG tablet Take 50 mg by mouth at bedtime.             Family History  Problem Relation Age of Onset  . Heart disease Father   . Heart disease Son   . Heart disease Mother   . Heart disease Other     grandfather      Review of Systems:     Cardiac Review of Systems: Y or N  Chest Pain [  n  ]  Resting SOB [ y  ] Exertional SOB  [name]  ]  Orthopnea [name]  ]   Pedal Edema [name]   ]    Palpitations [name]  ] Syncope  [name]  ]   Presyncope [ n  ]  General Review of Systems: [Y] = yes [  ]=no Constitional: recent weight change [ n ]; anorexia [  ]; fatigue [ y ]; nausea [  ]; night sweats [  ]; fever [ n ]; or chills [ n ];                                                                                                                                          Dental: poor dentition[  ];  Eye : blurred vision [  ]; diplopia [   ]; vision changes [  ];  Amaurosis fugax[  ]; Resp: cough [ y];  wheezing[ y ];  hemoptysis[n  ]; shortness of breath[y  ]; paroxysmal nocturnal dyspnea[  ]; dyspnea on exertion[ y ]; or orthopnea[  y];  GI:  gallstones[  ], vomiting[  ];  dysphagia[  ]; melena[  ];  hematochezia [  ]; heartburn[  ];   Hx of  Colonoscopy[  ]; GU: kidney stones [  ]; hematuria[  ];   dysuria [  ];  nocturia[  ];  history of     obstruction [  ];             Skin: rash, swelling[  ];, hair loss[  ];  peripheral edema[  ];  or itching[  ]; Musculosketetal: myalgias[  ];  joint swelling[  ];  joint erythema[  ];  joint pain[  ];  back pain[  ];  Heme/Lymph: bruising[  ];  bleeding[  ];  anemia[  ];  Neuro: TIA[  ];  headaches[  ];  stroke[  ];  vertigo[  ];  seizures[  ];   paresthesias[  ];  difficulty walking[yes complains of fibromyalgia   ];  Psych:depression[  ]; anxiety[  ];  Endocrine: diabetes[  ];  thyroid dysfunction[  ];  Immunizations: Flu [ y ]; Pneumococcal[y  ];  Other:  Physical Exam: There were no vitals taken for this visit.  General appearance: alert,  appears older than stated age, no distress, moderately obese and Short of breath at rest on oxygen in the office. She gets short of breath walking from the lobby to the exam room and getting her weight Neurologic: intact Heart: regular rate and rhythm, S1, S2 normal, no murmur, click, rub or gallop do not hear murmur of AS Lungs: diminished breath sounds bilaterally Abdomen: soft, non-tender; bowel sounds normal; no masses,  no organomegaly Extremities: extremities normal, atraumatic, no cyanosis or edema and Homans sign is negative, no sign of DVT   Diagnostic Studies & Laboratory data:     Recent Radiology Findings: Ct Chest Wo Contrast  08/28/2015  CLINICAL DATA:  75 year old female with shortness of breath and productive cough. Followup evaluation for thoracic aortic aneurysm. EXAM: CT CHEST WITHOUT CONTRAST TECHNIQUE: Multidetector CT imaging of the chest was performed following the standard protocol without IV contrast. COMPARISON:  Chest CT 08/28/2014. FINDINGS: Mediastinum/Lymph Nodes: Heart size is normal. There is no significant pericardial fluid, thickening or pericardial calcification. There is atherosclerosis of the thoracic aorta, the great vessels of the mediastinum and the coronary arteries, including calcified atherosclerotic plaque in the left main, left anterior descending, left circumflex and right coronary arteries. Calcifications of the aortic valve. Mild calcifications of the mitral annulus. Aneurysmal dilatation of the ascending thoracic aorta which measures up to 5.4 cm in diameter (previous 5.3 cm). The thoracic aortic arch is ectatic measuring up to 3.8 cm in diameter just distal to the origin of the left subclavian artery. The isthmus of the thoracic aorta is aneurysmal measuring 5.2 cm in diameter (previously 4.9 cm in diameter when measured in a similar fashion on the prior examination). Descending thoracic aorta is also mildly aneurysmal measuring up to the 4.4 cm in  diameter at the level of the pulmonic trunk (previously 4.2 cm in diameter). No pathologically enlarged mediastinal or hilar lymph nodes. Please note that accurate exclusion of hilar adenopathy is limited on noncontrast CT scans. Esophagus is unremarkable in appearance. No axillary lymphadenopathy. Lungs/Pleura: No acute consolidative airspace disease. No pleural effusions. No suspicious appearing pulmonary nodules or masses. Mild diffuse bronchial wall thickening with moderate centrilobular and mild paraseptal emphysema. Upper Abdomen: Unremarkable. Musculoskeletal/Soft Tissues: Old compression fracture of T10 with approximately 60% loss of anterior vertebral body height is unchanged. There is a new compression fracture of T12 with approximately 50% loss of anterior vertebral body height, which does not appear to be acute. There are no aggressive appearing lytic or blastic lesions noted in the visualized portions of the skeleton. IMPRESSION: 1. Slow progressive enlargement of the aneurysmal segments of the thoracic aorta, as detailed above. This is most significant in the ascending thoracic aorta where the maximal diameter is currently 5.4 mm (previously 5.3 mm). The greatest increase in diameter has occurred in the isthmus, which currently measures 5.2 cm in diameter (previously 4.9 cm in diameter). 2. There are calcifications of the aortic valve. Echocardiographic correlation for evaluation of potential valvular dysfunction may be warranted if clinically indicated. 3. Left main and 3 vessel coronary artery disease again noted. 4. Mild diffuse bronchial thickening with moderate centrilobular and mild paraseptal emphysema; imaging findings suggestive of underlying COPD. 5. Additional incidental findings, as above. Electronically Signed   By: Trudie Reed M.D.   On: 08/28/2015  15:01      Ct Chest Wo Contrast  08/28/2014   CLINICAL DATA:  Evaluate size of thoracic aorta.  EXAM: CT CHEST WITHOUT CONTRAST   TECHNIQUE: Multidetector CT imaging of the chest was performed following the standard protocol without IV contrast.  COMPARISON:  CT thorax 11/06/2012  FINDINGS: Ascending aorta measures 53 mm in maximum dimension compared to 52 mm on 11/06/2012. The descending thoracic aorta measures 42 mm compared to 40 mm on prior (image 30, series 3). The transverse arch measures 44 mm compared to 42 mm on prior. There is intimal calcification aorta. The great vessels are normal. No pericardial fluid. The descending thoracic aorta is ectatic.  Mild centrilobular emphysema in the upper lobes. No measurable nodularity.  Limited view of the upper abdomen is unremarkable. Limited view skeleton demonstrates compression fracture at T11 which is new from 12/05/2013 with approximately 60% loss of vertebral body height anteriorly. No retropulsion.  IMPRESSION: 1. Ascending aorta measures 1 mm larger at 53 mm compared to 12/05/2013 . 2. Descending thoracic aorta measures 2 mm larger at 42 mm. 3. Subacute compression fracture at T11 is new from 12/05/2013.   Electronically Signed   By: Genevive Bi M.D.   On: 08/28/2014 12:52   I have independently reviewed the above radiology studies  and reviewed the findings with the patient.    Recent Lab Findings: Lab Results  Component Value Date   WBC 7.8 11/29/2006   HGB 12.3 11/29/2006   HCT 36.4 11/29/2006   PLT 313 11/29/2006   GLUCOSE 88 12/12/2011   CHOL 145 05/31/2010   TRIG 119.0 05/31/2010   HDL 46.60 05/31/2010   LDLCALC 75 05/31/2010   ALT 19 05/31/2010   AST 22 05/31/2010   NA 137 12/12/2011   K 4.2 12/12/2011   CL 95* 12/12/2011   CREATININE 1.80* 08/26/2014   BUN 59* 08/26/2014   CO2 35* 12/12/2011   TSH 1.71 09/17/2007   INR 1.1 RATIO 11/29/2006   Aortic Size Index=     5.3 cm    /There is no height or weight on file to calculate BSA. = 2.77  < 2.75 cm/m2      4% risk per year 2.75 to 4.25          8% risk per year > 4.25 cm/m2    20% risk per  year  Chronic Kidney Disease   Stage I     GFR >90  Stage II    GFR 60-89  Stage IIIA GFR 45-59  Stage IIIB GFR 30-44  Stage IV   GFR 15-29  Stage V    GFR  <15  Lab Results  Component Value Date   CREATININE 1.80* 08/26/2014   CrCl cannot be calculated (Unknown ideal weight.).    Assessment / Plan:    Patient is a 75 year old female with severe underlying pulmonary disease who returns for followup CT of the chest. She has a known ascending aortic aneurysm at approximately 5 cm. Reviewing the scans from 2008 there's been just very slight change. With her significant underlying pulmonary disease and her desire not to have any major invasive procedures done I recommended that we continue with observation and blood pressure control. She is aware of the risk of sudden dissection and/or rupture. The patient has again confirmed this year she does not wish to have operative intervention for repair of enlarged aorta. I discussed with her in detail the risks and options of both elective repair of her ascending  aorta and emergency repair. She is clear she is not interested in surgery, but would like to return in one year for follow-up visit to see if there are any changes. Now with Stage IV chronic renal disease  not previously known on her last visit 1 year ago- she will contact her primary care physician tomorrow to arrange for evaluation of what appears to be fairly sudden change in her renal function in [place].- No contrast was given for her CT scan today. She notes she schedule for CT scan of the brain early next week I cautioned her to make sure her ordering physician for this scan is aware of her change in renal function. Severe COPD  Patient again has declined any consideration for elective or emergent repair of her aneurysmal disease, she is very specific she does not wish to die "on the operating room table". We'll plan to see her back in one year, but will not get a scan at that  time     Delight Ovens MD 08/28/2015 3:24 PM

## 2015-09-01 ENCOUNTER — Other Ambulatory Visit: Payer: Self-pay | Admitting: *Deleted

## 2015-09-01 MED ORDER — PREDNISONE 20 MG PO TABS: 20.0000 mg | ORAL_TABLET | Freq: Every day | ORAL | Status: DC

## 2015-09-02 DIAGNOSIS — Z888 Allergy status to other drugs, medicaments and biological substances status: Secondary | ICD-10-CM | POA: Diagnosis not present

## 2015-09-02 DIAGNOSIS — I712 Thoracic aortic aneurysm, without rupture: Secondary | ICD-10-CM | POA: Diagnosis not present

## 2015-09-02 DIAGNOSIS — Z7982 Long term (current) use of aspirin: Secondary | ICD-10-CM | POA: Diagnosis not present

## 2015-09-02 DIAGNOSIS — E860 Dehydration: Secondary | ICD-10-CM | POA: Diagnosis not present

## 2015-09-02 DIAGNOSIS — M1990 Unspecified osteoarthritis, unspecified site: Secondary | ICD-10-CM | POA: Diagnosis present

## 2015-09-02 DIAGNOSIS — I10 Essential (primary) hypertension: Secondary | ICD-10-CM | POA: Diagnosis present

## 2015-09-02 DIAGNOSIS — Z881 Allergy status to other antibiotic agents status: Secondary | ICD-10-CM | POA: Diagnosis not present

## 2015-09-02 DIAGNOSIS — Z9981 Dependence on supplemental oxygen: Secondary | ICD-10-CM | POA: Diagnosis not present

## 2015-09-02 DIAGNOSIS — E7800 Pure hypercholesterolemia, unspecified: Secondary | ICD-10-CM | POA: Diagnosis present

## 2015-09-02 DIAGNOSIS — J209 Acute bronchitis, unspecified: Secondary | ICD-10-CM | POA: Diagnosis present

## 2015-09-02 DIAGNOSIS — R4182 Altered mental status, unspecified: Secondary | ICD-10-CM | POA: Diagnosis not present

## 2015-09-02 DIAGNOSIS — R0602 Shortness of breath: Secondary | ICD-10-CM | POA: Diagnosis not present

## 2015-09-02 DIAGNOSIS — Z87891 Personal history of nicotine dependence: Secondary | ICD-10-CM | POA: Diagnosis not present

## 2015-09-02 DIAGNOSIS — E872 Acidosis: Secondary | ICD-10-CM | POA: Diagnosis not present

## 2015-09-02 DIAGNOSIS — E119 Type 2 diabetes mellitus without complications: Secondary | ICD-10-CM | POA: Diagnosis present

## 2015-09-02 DIAGNOSIS — J441 Chronic obstructive pulmonary disease with (acute) exacerbation: Secondary | ICD-10-CM | POA: Diagnosis not present

## 2015-09-02 DIAGNOSIS — Z85118 Personal history of other malignant neoplasm of bronchus and lung: Secondary | ICD-10-CM | POA: Diagnosis not present

## 2015-09-02 DIAGNOSIS — J9602 Acute respiratory failure with hypercapnia: Secondary | ICD-10-CM | POA: Diagnosis not present

## 2015-09-02 DIAGNOSIS — Z79899 Other long term (current) drug therapy: Secondary | ICD-10-CM | POA: Diagnosis not present

## 2015-09-02 DIAGNOSIS — N289 Disorder of kidney and ureter, unspecified: Secondary | ICD-10-CM | POA: Diagnosis not present

## 2015-09-02 DIAGNOSIS — E039 Hypothyroidism, unspecified: Secondary | ICD-10-CM | POA: Diagnosis present

## 2015-09-02 DIAGNOSIS — G9341 Metabolic encephalopathy: Secondary | ICD-10-CM | POA: Diagnosis not present

## 2015-09-02 DIAGNOSIS — J440 Chronic obstructive pulmonary disease with acute lower respiratory infection: Secondary | ICD-10-CM | POA: Diagnosis not present

## 2015-09-02 DIAGNOSIS — F419 Anxiety disorder, unspecified: Secondary | ICD-10-CM | POA: Diagnosis present

## 2015-09-07 ENCOUNTER — Ambulatory Visit (INDEPENDENT_AMBULATORY_CARE_PROVIDER_SITE_OTHER): Payer: Medicare Other | Admitting: Adult Health

## 2015-09-07 ENCOUNTER — Other Ambulatory Visit: Payer: Self-pay | Admitting: *Deleted

## 2015-09-07 ENCOUNTER — Encounter: Payer: Self-pay | Admitting: Adult Health

## 2015-09-07 ENCOUNTER — Ambulatory Visit: Payer: Medicare Other | Admitting: *Deleted

## 2015-09-07 VITALS — BP 124/66 | HR 86 | Temp 99.0°F | Ht 64.0 in | Wt 188.0 lb

## 2015-09-07 DIAGNOSIS — J9611 Chronic respiratory failure with hypoxia: Secondary | ICD-10-CM

## 2015-09-07 DIAGNOSIS — J9612 Chronic respiratory failure with hypercapnia: Secondary | ICD-10-CM | POA: Diagnosis not present

## 2015-09-07 DIAGNOSIS — J441 Chronic obstructive pulmonary disease with (acute) exacerbation: Secondary | ICD-10-CM | POA: Diagnosis not present

## 2015-09-07 NOTE — Assessment & Plan Note (Signed)
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  Will try to get set up for BIPAP due to Hypercarbia, most likely will need sleep study.

## 2015-09-07 NOTE — Progress Notes (Signed)
Morgan Hays is a 75 year old woman who follows up today for her COPD, dyspnea, and for a history of a RLL pulmonary nodule. 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 No sgy has been planned due to her significant lung disease. Also with hx depression.   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 Could not tolerate Daliresp. She has been rx with pred + abx x 1 since last visit by PCP. Currently at baseline, O2 is set on 3L/min. She does have some wheezing. Taking loratadine and fluticasone every day.   ROV 07/26/11 -- Severe COPD on O2, chronic cough being rx for allergies and GERD. She is here for regular f/u. She mentions today that she is having some cyanosis, coolness and numbness in her feet for the last 5 -6 months. No w/u done yet, but she is going to mention to her PCP. Breathing has been stable, no flares since last visit. Taking Spiriva + Symbicort + SABA; uses albuterol at least once a day.   ROV 10/31/11 -- Severe COPD on O2, chronic cough being rx for allergies and GERD. Having more nasal sx, gtt, wheezing.  She has not had flare since last time.  Interested in Research scientist (medical).   ROV 02/01/12 -- Severe COPD on O2, chronic cough being rx for allergies and GERD. Since last visit she has undergone Myoview for atypical CP, reassuring study. 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 CT chest > no evidence PE.  Breathing has been stable, still limiting her exertion. Rarely uses SABA.  Spiriva + Symbicort, O2 on 3L/min. She believes that stopping cymbalta has helped her. Some occasional cough.   ROV 06/05/12 -- Severe COPD on O2, chronic cough being rx for allergies and GERD.  CAT score today 23. Spiriva + Symbicort, O2 on 3L/min.  Reports today that her energy is very low, not sure that it is worsening of breathing. She is using albuterol about every day. She had the flu shot. She had prednisone/abx in September. Went to ED in august  after a fall.   ROV 09/21/12 -- Severe COPD on O2, chronic cough being rx for allergies and GERD.  Her breathing is very limiting. She has been rx with pred x 2 since last time.   ROV 10/22/12 -- Severe COPD on O2, chronic cough being rx for allergies and GERD. Returns today reporting worsening dyspnea. We started daily pred a month ago to see if she would benefit. She can tell a difference at rest and w sleeping. No real difference in exertional SOB  ROV 11/21/12 -- Severe COPD, hypoxemia, chronic cough, GERD, allergic chronic rhinitis. We have initiated chronic pred, started tapering last visit, currently 15. Since last time she was evaluated by [name] for increased exertional SOB, overall malaise, was sent for CT chest as below > no PE, no infiltrates, stable emphysema. Also had thyroid studies, showed high TSH and synthroid increased. Her breathing is Ok at rest, worse with exertion.  Using duonebs 2x a day, spiriva + symbicort.   ROV 01/02/13 -- Severe COPD, hypoxemia, chronic cough, GERD, allergic chronic rhinitis. Her chronic pred is at  (increased last time). She doesn't notice any real change in breathing. Cough is stable. She wheezes with exertion.    ROV 02/22/13 -- Severe COPD, hypoxemia, chronic cough, GERD, allergic chronic rhinitis, chronic pred 20. She presents today with about 3 weeks of increased DOE, some increased LE edema. No  change in cough or wheeze. Has to rest with 20 ft. Using duonebs 3-4x a day. O2 on 4L/min pulsed (continuous at home). Her wt is stable.   ROV 03/27/13 -- Severe COPD, hypoxemia, chronic cough, GERD, allergic chronic rhinitis, chronic pred 20. Last time we tried changing symbicort to [facility] to see if she would benefit.   ROV 06/21/13 -- Severe COPD, hypoxemia, chronic cough, GERD, allergic chronic rhinitis, chronic pred 20. She unfortunately has been dealing with with several problems > anemia, abnormal thyroid testing, has had falls since last time. She is to  see NSGY for newly identified aneurysm. She feel like she loses power in her legs, not really a balance problem. He O2 is set on 3-4L/min. Happens going from seated to standing position. She has had some disorientation, ? Related to pred.   ROV 10/23/13 -- severe COPD, hypoxemia, chronic cough. Chronic pred 20. Seen by Dr Sherene Sires 4/1 for mid-back pain. Still happens intermittently.  She is taking benadryl and zyrtec. She continues to have exertional SOB. Also some instability. She is on breo + Spiriva.   ROV 12/13/13 -- severe COPD on pred 20, hypoxemia, chronic cough. She unfortunately experienced a ruptured divertic, required ileostomy. She was readmitted for hypercapneic resp failure and required BiPAP temporarily. She is now back at home.  She wonders about whether she needs BiPAP.   ROV 02/25/14 -- follow up for severe COPD, hypoxemia, chronic cough. She is on Breo + Spiriva. She uses duonebs 1-2x a day. Her O2 is on 3 pulsed w exertion.  Her stable Pred dose is . She is wondering if she can have her ileostomy reversed - but she acknowledges that it would be high risk. No flares, no new meds. Sx are stable.   ROV 06/03/14 -- Follow-up visit for COPD, interstitial lung disease, hypoxemia. She remains on Breo, Spiriva and prednisone 20 mg daily. She is quite limited with her daily activities, is stable at rest. We talked today about her life expectancy, the risks of CPR or mechanical ventilation. She underscored  her desire to be DO NOT RESUSCITATE.   ROV 10/10/14 -- follow-up visit for hypoxemic respiratory failure in the setting of interstitial lung disease and COPD. She's currently managed on prednisone 20 mg daily as well as Spiriva and Breo. She is doing fairly well. She has not had any hospitalizations, but she did go to urgent care and was treated for sinusitis.    Post Hospital follow up 11/09/14 Pt returns for a post hospital follow up  Recently admitted to [facility] ? Reason .  Records  requested.  Says she is feeling better .  Breathing is at baseline with no flare of cough, dyspnea .  Does have back pain with recently dx compression fx Seeing ortho in [place] .  Very upset about pain and tx options.  No chest pain, orthopnea, edema or fever Steroid education given .    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 She was hospitalized in April for AE-COPD, hypercapnia.  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 She is supposed to have an epidural injection soon. Wearing 3-4L/min. Using breo and spiriva, SABA prn.   ROV 03/31/15 -- follow-up visit for chronic hypoxemia, interstitial lung disease, COPD. She is receiving percocet prn for her back pain. Has not received injections since last time. She is on  pred 20, spiriva, breo, duoneb prn. She uses about 1-2x a day. Her O2 is at . She is unclear that she has benefited from breo. She believes that she is a bit worse.  She had an episode of acute resp failure that landed her at [facility]. They tried to use BiPAP and she refused it.   ROV1/31/17 -- his follow-up visit for interstitial lung disease and COPD with associated chronic hypoxemic respiratory failure. We've managed her on chronic prednisone currently at 20 mg daily. She also takes Spiriva, duoneb prn.  Last time we stopped Breo to see if she tolerated. She has been having breakthrough sinus drainage, nasal congestion.  She has tolerated off Breo. She uses duoneb about bid. She wonders if zyrtec is still effective.     09/07/2015 Post hospital follow up : COPD /ILD /Hypoxic Resp Failure  Pt returns for a post hospital follow up.  [facility] for COPD exacerbation, Hypercarbic Resp failure w/ encephalopathy. Labs reviewed. Discharge records requested. She was informed that she needs to be begin on BIPAP . PCO2 was 62. She had  initial  confusion , she was treated with abx, steroids and nebs. And BIPAP support per pt. She was discharged on zithromax and pred .  She is feeling better with less dyspnea and cough. Does remain weak.  Gets winded easily with activity. Previously on symbicort and Breo .  Remains on Spiriva  daily And Duoneb As needed   On Oxygen 3l/m .  Denies chest pain, orthopnea, increased edema or fever.   ROS Constitutional:   No  weight loss, night sweats,  Fevers, chills,  +fatigue, or  lassitude.  HEENT:   No headaches,  Difficulty swallowing,  Tooth/dental problems, or  Sore throat,                No sneezing, itching, ear ache,  +nasal congestion, post nasal drip,   CV:  No chest pain,  Orthopnea, PND, swelling in lower extremities, anasarca, dizziness, palpitations, syncope.   GI  No heartburn, indigestion, abdominal pain, nausea, vomiting, diarrhea, change in bowel habits, loss of appetite, bloody stools.   Resp:    No chest wall deformity  Skin: no rash or lesions.  GU: no dysuria, change in color of urine, no urgency or frequency.  No flank pain, no hematuria   MS:  No joint pain or swelling.  No decreased range of motion.  No back pain.  Psych:  No change in mood or affect. No depression or anxiety.  No memory loss.      EXAM Filed Vitals:   09/07/15 0906  BP: 124/66  Pulse: 86  Temp: 99 F (37.2 C)  TempSrc: Oral  Height: 5\' 4"  (1.626 m)  Weight: 188 lb (85.276 kg)  SpO2: 93%    Gen: Pleasant, obese, in no distress, elderly and chronically ill appearing in wc   ENT: No lesions,  mouth clear,  oropharynx clear, no postnasal drip  Neck: No JVD, no TMG, no carotid bruits  Lungs: Distant  BS w/ no wheezing, very distant  Cardiovascular: RRR, heart sounds normal, no murmur or gallops, trace-1+ edema  Musculoskeletal: No deformities, no cyanosis or clubbing  Neuro: alert, non focal  Skin: Warm, no lesions or rashes , thin and ecchymotic areas on forearms    12/19/11  Myoview:  Overall Impression: Normal stress nuclear study with a small, mild, fixed apical defect consistent with thinning; no ischemia.  LV Ejection Fraction: 79%. LV  Wall Motion: NL LV Function; NL Wall Motion  CT chest 11/06/12 >>  No PE, centra-lobular emphysema, no infiltrates or nodules, stable thoracic aortic aneurysm  CT chest 08/28/15 > Slow progressive enlargement of the aneurysmal segments of the thoracic aorta, as detailed above. This is most significant in the ascending thoracic aorta where the maximal diameter is currently 5.4 mm (previously 5.3 mm). The greatest increase in diameter has occurred in the isthmus, which currently measures 5.2 cm in diameter (previously 4.9 cm in diameter). 2. There are calcifications of the aortic valve. Echocardiographic correlation for evaluation of potential valvular dysfunction may be warranted if clinically indicated. 3. Left main and 3 vessel coronary artery disease again noted. 4. Mild diffuse bronchial thickening with moderate centrilobular and mild paraseptal emphysema; imaging findings suggestive of underlying COPD. 5. Additional incidental findings, as above.

## 2015-09-07 NOTE — Patient Instructions (Signed)
Change Spiriva to Stiolto 2 puffs daily .  Call back if insurance does not cover.  Use Duoneb As needed  Only .  Taper prednisone as directed to 20mg  daily and hold.  Continue on Oxygen 3l/m .  Try to begin BIPAP At bedtime .  Follow up Dr. Delton CoombesByrum  In 6 weeks and As needed.  Please contact office for sooner follow up if symptoms do not improve or worsen or seek emergency care

## 2015-09-07 NOTE — Patient Outreach (Signed)
[facility](THN) Care Management  09/07/2015  Morgan LeepJudy Hays 02/22/1941 [id number]   Initial transition of care  RNCM spoke with patient , Morgan Hays reports that she as discharged from [facility] on 3/3, after being admitted for Acute respiratory failure, COPD exacerbation. Morgan Hays reported being tired after having an appointment with her pulmonary office in [place] on this morning.  Patient continues to wear her oxygen at 3  liters, Reviewed discharge instructions with patient verbalized understanding, reviewed medications , reported she has completed her antibiotic and understands her tapering dose of prednisone.  Morgan Hays has orders for home health RN, PT and OT by [facility] she reported that she has not heard from them yet, patient is also interested with having a home health aide until she  gains some strength.  Morgan Hays states she has a follow up visit with her PCP on 3/16, patient reports the sister in law or daughter will provided transportation, Morgan Hays is connected with [facility] for transportation if needed.  Morgan Hays discussed plan for wearing BiPAP at night, patient states she has fought this for a long time, but is now agreeable to it at this time. Patient awaiting phone call from MD  office regarding whether she will need a  sleep study to get BiPAP for night time use. Morgan Hays also discussed reading information on her discharge paper work about diagnosis of end stage COPD, patient reflected that she knew if was coming just hard to read it. Briefly discussed palliative/hospice care , not ready at this time patient is familiar with services.     Plan Placed call to [facility] to confirm they had received orders and to relay that Morgan Hays would like to add home health aide, representative states the RN will assess that on her first visit.  Patient will remain active with transition of care program Will send this initial note  to PCP office  [facility] CM Care Plan Problem Three        Most Recent Value   Care Plan Problem Three  High Risk for Readmission to hospital    Role Documenting the Problem Three  Care Management Coordinator   Care Plan for Problem Three  Active   THN Long Term Goal (31-90) days  Patient will not experience a hospital admission in the next 31days   [facility] Start Date  09/07/15   Interventions for Problem Three Long Term Goal  Educated on the transition of care program, importance of PCP follow up after hospital admission, taking medications as prescribed, and notifying MD sooner for new symptoms   THN CM Short Term Goal #1 (0-30 days)  Patient will follow up with PCP in the next 14 days   THN CM Short Term Goal #1 Start Date  09/07/15   Interventions for Short Term Goal #1  Educated the importance of pcp follow up after hospital admission , for medication review   THN CM Short Term Goal #2 (0-30 days)  Patient will report home health RN visit by 3/9   [facility] CM Short Term Goal #2 Start Date  09/07/15   Interventions for Short Term Goal #2  [facility] to confirm order and updated patient     Egbert GaribaldiKimberly Glover, RN, [facility] Care Management [phone]- Mobile [phone]- Toll Free Main Office

## 2015-09-07 NOTE — Assessment & Plan Note (Signed)
Recent flare now resolving with acute on chronic hypercarbic RF   Plan  Change Spiriva to Stiolto 2 puffs daily .  Call back if insurance does not cover.  Use Duoneb As needed  Only .  Taper prednisone as directed to 20mg  daily and hold.  Continue on Oxygen 3l/m .  Try to begin BIPAP At bedtime .  Follow up Dr. Delton CoombesByrum  In 6 weeks and As needed.  Please contact office for sooner follow up if symptoms do not improve or worsen or seek emergency care

## 2015-09-07 NOTE — Addendum Note (Signed)
Addended by: Egbert GaribaldiGLOVER, KIMBERLY A on: 09/07/2015 08:32 PM   Modules accepted: Medications

## 2015-09-08 DIAGNOSIS — Z9981 Dependence on supplemental oxygen: Secondary | ICD-10-CM | POA: Diagnosis not present

## 2015-09-08 DIAGNOSIS — F331 Major depressive disorder, recurrent, moderate: Secondary | ICD-10-CM | POA: Diagnosis not present

## 2015-09-08 DIAGNOSIS — Z932 Ileostomy status: Secondary | ICD-10-CM | POA: Diagnosis not present

## 2015-09-08 DIAGNOSIS — Z7952 Long term (current) use of systemic steroids: Secondary | ICD-10-CM | POA: Diagnosis not present

## 2015-09-08 DIAGNOSIS — J441 Chronic obstructive pulmonary disease with (acute) exacerbation: Secondary | ICD-10-CM | POA: Diagnosis not present

## 2015-09-08 DIAGNOSIS — E1142 Type 2 diabetes mellitus with diabetic polyneuropathy: Secondary | ICD-10-CM | POA: Diagnosis not present

## 2015-09-08 DIAGNOSIS — J209 Acute bronchitis, unspecified: Secondary | ICD-10-CM | POA: Diagnosis not present

## 2015-09-08 DIAGNOSIS — Z7951 Long term (current) use of inhaled steroids: Secondary | ICD-10-CM | POA: Diagnosis not present

## 2015-09-08 DIAGNOSIS — Z79891 Long term (current) use of opiate analgesic: Secondary | ICD-10-CM | POA: Diagnosis not present

## 2015-09-08 DIAGNOSIS — Z7984 Long term (current) use of oral hypoglycemic drugs: Secondary | ICD-10-CM | POA: Diagnosis not present

## 2015-09-08 DIAGNOSIS — I10 Essential (primary) hypertension: Secondary | ICD-10-CM | POA: Diagnosis not present

## 2015-09-08 DIAGNOSIS — J9602 Acute respiratory failure with hypercapnia: Secondary | ICD-10-CM | POA: Diagnosis not present

## 2015-09-08 DIAGNOSIS — E782 Mixed hyperlipidemia: Secondary | ICD-10-CM | POA: Diagnosis not present

## 2015-09-08 DIAGNOSIS — J440 Chronic obstructive pulmonary disease with acute lower respiratory infection: Secondary | ICD-10-CM | POA: Diagnosis not present

## 2015-09-08 DIAGNOSIS — E034 Atrophy of thyroid (acquired): Secondary | ICD-10-CM | POA: Diagnosis not present

## 2015-09-09 DIAGNOSIS — J9602 Acute respiratory failure with hypercapnia: Secondary | ICD-10-CM | POA: Diagnosis not present

## 2015-09-09 DIAGNOSIS — J440 Chronic obstructive pulmonary disease with acute lower respiratory infection: Secondary | ICD-10-CM | POA: Diagnosis not present

## 2015-09-09 DIAGNOSIS — J441 Chronic obstructive pulmonary disease with (acute) exacerbation: Secondary | ICD-10-CM | POA: Diagnosis not present

## 2015-09-09 DIAGNOSIS — F331 Major depressive disorder, recurrent, moderate: Secondary | ICD-10-CM | POA: Diagnosis not present

## 2015-09-09 DIAGNOSIS — J209 Acute bronchitis, unspecified: Secondary | ICD-10-CM | POA: Diagnosis not present

## 2015-09-09 DIAGNOSIS — E1142 Type 2 diabetes mellitus with diabetic polyneuropathy: Secondary | ICD-10-CM | POA: Diagnosis not present

## 2015-09-10 DIAGNOSIS — J209 Acute bronchitis, unspecified: Secondary | ICD-10-CM | POA: Diagnosis not present

## 2015-09-10 DIAGNOSIS — F331 Major depressive disorder, recurrent, moderate: Secondary | ICD-10-CM | POA: Diagnosis not present

## 2015-09-10 DIAGNOSIS — J441 Chronic obstructive pulmonary disease with (acute) exacerbation: Secondary | ICD-10-CM | POA: Diagnosis not present

## 2015-09-10 DIAGNOSIS — J440 Chronic obstructive pulmonary disease with acute lower respiratory infection: Secondary | ICD-10-CM | POA: Diagnosis not present

## 2015-09-10 DIAGNOSIS — E1142 Type 2 diabetes mellitus with diabetic polyneuropathy: Secondary | ICD-10-CM | POA: Diagnosis not present

## 2015-09-10 DIAGNOSIS — J9602 Acute respiratory failure with hypercapnia: Secondary | ICD-10-CM | POA: Diagnosis not present

## 2015-09-11 ENCOUNTER — Telehealth: Payer: Self-pay | Admitting: Emergency Medicine

## 2015-09-11 DIAGNOSIS — J9602 Acute respiratory failure with hypercapnia: Secondary | ICD-10-CM | POA: Diagnosis not present

## 2015-09-11 DIAGNOSIS — J441 Chronic obstructive pulmonary disease with (acute) exacerbation: Secondary | ICD-10-CM | POA: Diagnosis not present

## 2015-09-11 DIAGNOSIS — J440 Chronic obstructive pulmonary disease with acute lower respiratory infection: Secondary | ICD-10-CM | POA: Diagnosis not present

## 2015-09-11 DIAGNOSIS — E1142 Type 2 diabetes mellitus with diabetic polyneuropathy: Secondary | ICD-10-CM | POA: Diagnosis not present

## 2015-09-11 DIAGNOSIS — F331 Major depressive disorder, recurrent, moderate: Secondary | ICD-10-CM | POA: Diagnosis not present

## 2015-09-11 DIAGNOSIS — J209 Acute bronchitis, unspecified: Secondary | ICD-10-CM | POA: Diagnosis not present

## 2015-09-11 MED ORDER — TIOTROPIUM BROMIDE-OLODATEROL 2.5-2.5 MCG/ACT IN AERS: 2.0000 | INHALATION_SPRAY | Freq: Every day | RESPIRATORY_TRACT | Status: DC

## 2015-09-11 NOTE — Telephone Encounter (Signed)
Pt calling states that she was given a Stiolto sample last OV and it has worked Firefightergreat. Pt would like for an Rx to be sent to [facility]. Rx sent. Nothing further needed.

## 2015-09-14 ENCOUNTER — Other Ambulatory Visit: Payer: Self-pay | Admitting: *Deleted

## 2015-09-14 ENCOUNTER — Encounter: Payer: Self-pay | Admitting: *Deleted

## 2015-09-14 NOTE — Patient Outreach (Signed)
[facility]) Care Management  09/14/2015  Lynnae Ludemann [birth year] [id number]   Transition of care week #2  RNCM spoke with Mrs.Milanes as part of transition of care program. Patient reports that her breathing is good, is has filled her prescription for new inhaler, stilolto. Mrs.Salser reports that [facility] RN, Occupational, and physical therapy has visited her, she states physical therapy plans to discharge her soon because she is walking well using her rollator.  Mrs.Ericsson has post hospital visit with her PCP on 3/16, her sister in law will provide transportation. Patient states he has not had arrangement made for sleep study for Bipap, she hopes to have study done at home.   Mrs. Fehring reports that she gained 8 pounds while in the hospital and increased leg swelling, patient reports decreased swelling in lower extremities and weight down to 180.  Mrs. Elders checks her blood sugars a few times a week and states readings 90 to 134, patient reports good appetite.  Plan Patient will remain active with transition of care, Initial transition of care home visit for 2/20 at 1:30 Patient will continue to participate with home health therapy Patient will call MD for worsening COPD symptoms.  THN CM Care Plan Problem Three        Most Recent Value   Care Plan Problem Three  High Risk for Readmission to hospital    Role Documenting the Problem Three  Care Management [place] for Problem Three  Active   THN Long Term Goal (31-90) days  Patient will not experience a hospital admission in the next [place] Term Goal Start Date  09/07/15   Interventions for Problem Three Long Term Goal  Educated on the transition of care program, importance of PCP follow up after hospital admission, taking medications as prescribed, and notifying MD sooner for new symptoms   THN CM Short Term Goal #1 (0-30 days)  Patient will follow up with PCP in the next 14  days   THN CM Short Term Goal #1 Start Date  09/07/15   [facility] CM Short Term Goal #1 Met Date  -- [appointment on 3/16, has seen Pulmonologist post hosp.]   Interventions for Short Term Goal #1  Educated the importance of pcp follow up after hospital admission , for medication review   THN CM Short Term Goal #2 (0-30 days)  Patient will report home health RN visit by 3/9   [facility] CM Short Term Goal #2 Start Date  09/07/15   [facility] CM Short Term Goal #2 Met Date  09/14/15   THN CM Short Term Goal #3 (0-30 days)  Patient will have sleep study scheduled in the next 14 days   THN  CM Short Term Goal #3 Start Date  09/14/15   Interventions for Short Term Goal #3  Patient to ask PCP at office visit on 3/16 about scheduling study.     Joylene Draft, RN, [place] Management [phone]- Mobile [phone]- Toll Free Main Office

## 2015-09-15 DIAGNOSIS — E1142 Type 2 diabetes mellitus with diabetic polyneuropathy: Secondary | ICD-10-CM | POA: Diagnosis not present

## 2015-09-15 DIAGNOSIS — J9602 Acute respiratory failure with hypercapnia: Secondary | ICD-10-CM | POA: Diagnosis not present

## 2015-09-15 DIAGNOSIS — J440 Chronic obstructive pulmonary disease with acute lower respiratory infection: Secondary | ICD-10-CM | POA: Diagnosis not present

## 2015-09-15 DIAGNOSIS — F331 Major depressive disorder, recurrent, moderate: Secondary | ICD-10-CM | POA: Diagnosis not present

## 2015-09-15 DIAGNOSIS — J209 Acute bronchitis, unspecified: Secondary | ICD-10-CM | POA: Diagnosis not present

## 2015-09-15 DIAGNOSIS — J441 Chronic obstructive pulmonary disease with (acute) exacerbation: Secondary | ICD-10-CM | POA: Diagnosis not present

## 2015-09-17 DIAGNOSIS — J441 Chronic obstructive pulmonary disease with (acute) exacerbation: Secondary | ICD-10-CM | POA: Diagnosis not present

## 2015-09-18 DIAGNOSIS — E1142 Type 2 diabetes mellitus with diabetic polyneuropathy: Secondary | ICD-10-CM | POA: Diagnosis not present

## 2015-09-18 DIAGNOSIS — J9602 Acute respiratory failure with hypercapnia: Secondary | ICD-10-CM | POA: Diagnosis not present

## 2015-09-18 DIAGNOSIS — F331 Major depressive disorder, recurrent, moderate: Secondary | ICD-10-CM | POA: Diagnosis not present

## 2015-09-18 DIAGNOSIS — J440 Chronic obstructive pulmonary disease with acute lower respiratory infection: Secondary | ICD-10-CM | POA: Diagnosis not present

## 2015-09-18 DIAGNOSIS — J209 Acute bronchitis, unspecified: Secondary | ICD-10-CM | POA: Diagnosis not present

## 2015-09-18 DIAGNOSIS — J441 Chronic obstructive pulmonary disease with (acute) exacerbation: Secondary | ICD-10-CM | POA: Diagnosis not present

## 2015-09-21 ENCOUNTER — Ambulatory Visit: Payer: Medicare Other | Admitting: *Deleted

## 2015-09-21 ENCOUNTER — Ambulatory Visit: Payer: Self-pay | Admitting: *Deleted

## 2015-09-21 NOTE — Patient Outreach (Signed)
[facility](THN) Care Management  09/21/2015  Humberto LeepJudy Coggeshall 11/18/1940 [id number]   Telephone outreach to patient to assist in completing the patient assistance application again for her [name] medication. Patient states she is no longer a patient of Dr. Allena KatzPatel whom the paperwork was given to for completion. The patient requested that I send a copy to Gus HeightAndrea Johnson, [place]. I've also sent an updated application to patient for her to sign, date, and return back. Once all paperwork has been returned, we will send off for processing. I am removing myself from the care team and Steve Rattlerawn Pettus, PharmD will follow up.  Seyed Heffley L. Massai Hankerson, AAS [facility] Care Management Assistant

## 2015-09-23 DIAGNOSIS — H26493 Other secondary cataract, bilateral: Secondary | ICD-10-CM | POA: Diagnosis not present

## 2015-09-23 DIAGNOSIS — J441 Chronic obstructive pulmonary disease with (acute) exacerbation: Secondary | ICD-10-CM | POA: Diagnosis not present

## 2015-09-23 DIAGNOSIS — J209 Acute bronchitis, unspecified: Secondary | ICD-10-CM | POA: Diagnosis not present

## 2015-09-23 DIAGNOSIS — F331 Major depressive disorder, recurrent, moderate: Secondary | ICD-10-CM | POA: Diagnosis not present

## 2015-09-23 DIAGNOSIS — J440 Chronic obstructive pulmonary disease with acute lower respiratory infection: Secondary | ICD-10-CM | POA: Diagnosis not present

## 2015-09-23 DIAGNOSIS — J9602 Acute respiratory failure with hypercapnia: Secondary | ICD-10-CM | POA: Diagnosis not present

## 2015-09-23 DIAGNOSIS — E119 Type 2 diabetes mellitus without complications: Secondary | ICD-10-CM | POA: Diagnosis not present

## 2015-09-23 DIAGNOSIS — E1142 Type 2 diabetes mellitus with diabetic polyneuropathy: Secondary | ICD-10-CM | POA: Diagnosis not present

## 2015-09-23 DIAGNOSIS — H524 Presbyopia: Secondary | ICD-10-CM | POA: Diagnosis not present

## 2015-09-23 NOTE — Patient Outreach (Signed)
[facility]N) Care Management  09/23/2015  Humberto LeepJudy Canepa 07/11/1940 [id number]   Telephone outreach to patient to inform her that Gus HeightAndrea Johnson, [place] did not complete the patient assistance application for her [name]. Patient states that due to the hassle of getting the form completed that she will not worry about getting the assistance. I communicated this to Steve Rattlerawn Pettus, PharmD and the pharmacy episode will be closed.  Taequan Stockhausen L. Bari Handshoe, AAS [facility] Care Management Assistant

## 2015-09-24 ENCOUNTER — Other Ambulatory Visit: Payer: Self-pay | Admitting: *Deleted

## 2015-09-24 ENCOUNTER — Telehealth: Payer: Self-pay | Admitting: Emergency Medicine

## 2015-09-24 ENCOUNTER — Other Ambulatory Visit: Payer: Self-pay | Admitting: Adult Health

## 2015-09-24 DIAGNOSIS — R0689 Other abnormalities of breathing: Secondary | ICD-10-CM

## 2015-09-24 NOTE — Patient Outreach (Signed)
[facility](THN) Care Management  09/24/2015  Humberto LeepJudy [place] 08/13/1940 [id number]   Telephone call to Mrs.Jock to follow up regarding contact with Pulmonary office, about Bipap at bedtime plans. Provided patient with  information that I received [facility] [place] [facility], that orders had been placed for Bipap with home agency and if further test such as sleep study is needed prior to bipap, patient will be informed. Mrs.Bohac verbalized understanding but hopes further study is not required.  Egbert GaribaldiKimberly Glover, RN, [facility] Care Management [phone]- Mobile [phone]- Toll Free Main Office

## 2015-09-24 NOTE — Telephone Encounter (Signed)
Called and spoke with Selena BattenKim. Reviewed recs. Kim voiced understanding and had no further questions. Order placed. Nothing further needed.

## 2015-09-24 NOTE — Patient Outreach (Signed)
[facility]) Care Management  09/24/2015  Morgan Hays [birth year] [id number]  Transition of care call week #3  RNCM placed call to patient as part of the transition of care program, HIPAA verified. Patient reports she has good days and bad days, this a bad day because she has a headache but she has taken her prn medication. Patient her breathing is good, she reports that the new inhaler stiolto is working out great, she has not had to use her duoneb, nearly as much, only once on yesterday.  Patient reports she has met her goals with home health therapies, and the Home Health RN last visit is scheduled for next week.  Patient reports she is still in agreement with trying bipap at night, but has not heard about further testing to get it set up and hoping that it all can be done at home.  Plan RNCM will contact Pulmonary office regarding Bipap Patient will remain active with transition of care program, and RN care coordinator home visit scheduled for 3/28.  Joylene Draft, RN, [place] Management [phone]- Mobile [phone]- Toll Free Main Office

## 2015-09-24 NOTE — Telephone Encounter (Signed)
Order entered will await, it they refuse on basis of hypercarbia then will need sleep study

## 2015-09-24 NOTE — Telephone Encounter (Signed)
Last ov on 09/07/15 Patient Instructions     Change Spiriva to Stiolto 2 puffs daily .  Call back if insurance does not cover.  Use Duoneb As needed Only .  Taper prednisone as directed to 20mg  daily and hold.  Continue on Oxygen 3l/m .  Try to begin BIPAP At bedtime .  Follow up Dr. Delton CoombesByrum In 6 weeks and As needed.  Please contact office for sooner follow up if symptoms do not improve or worsen or seek emergency care    Called and [facility] with [facility]Kim [facility]. She states that she spoke with the pt and the patient informed her that TP wanted her to have a sleep study done. I explained to her that in TP's ov note it states to begin BIPAP but no order for a sleep study. She states the patient does not have one at home and feels that she would need the sleep study in order to receive the BIPAP machine. I explained to her that I would send a message to TP and verify that Sleep study can be order and would return her call. She voiced understanding and had no further questions.   TP please advise

## 2015-09-29 ENCOUNTER — Encounter: Payer: Self-pay | Admitting: *Deleted

## 2015-09-29 ENCOUNTER — Other Ambulatory Visit: Payer: Self-pay | Admitting: *Deleted

## 2015-09-29 DIAGNOSIS — J441 Chronic obstructive pulmonary disease with (acute) exacerbation: Secondary | ICD-10-CM | POA: Diagnosis not present

## 2015-09-29 DIAGNOSIS — F331 Major depressive disorder, recurrent, moderate: Secondary | ICD-10-CM | POA: Diagnosis not present

## 2015-09-29 DIAGNOSIS — J440 Chronic obstructive pulmonary disease with acute lower respiratory infection: Secondary | ICD-10-CM | POA: Diagnosis not present

## 2015-09-29 DIAGNOSIS — E1142 Type 2 diabetes mellitus with diabetic polyneuropathy: Secondary | ICD-10-CM | POA: Diagnosis not present

## 2015-09-29 DIAGNOSIS — J9602 Acute respiratory failure with hypercapnia: Secondary | ICD-10-CM | POA: Diagnosis not present

## 2015-09-29 DIAGNOSIS — J209 Acute bronchitis, unspecified: Secondary | ICD-10-CM | POA: Diagnosis not present

## 2015-09-29 NOTE — Patient Outreach (Signed)
[facility]) Care Management   09/29/2015  Morgan Hays [birth year] [id number]  Morgan Hays is an 75 y.o. female  Subjective: " My breathing is doing better on today"  Objective:  BP 128/70 mmHg  Pulse 89  Resp 22  Wt 179 lb (81.194 kg)  SpO2 98% Review of Systems  Constitutional: Negative.   HENT: Negative.   Eyes: Negative.   Respiratory: Negative.  Negative for cough and shortness of breath.   Cardiovascular: Positive for leg swelling.       Bilateral lower extremity edema, legs tight  Gastrointestinal: Negative.   Genitourinary: Negative.   Musculoskeletal: Positive for back pain. Negative for falls.  Skin: Negative.   Neurological: Negative.   Psychiatric/Behavioral: Negative.     Physical Exam  Constitutional: She is oriented to person, place, and time. She appears well-developed and well-nourished.  Cardiovascular: Normal rate, regular rhythm, normal heart sounds and intact distal pulses.   Respiratory: Effort normal and breath sounds normal. She has no wheezes.  GI: Soft.  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  Skin: Skin is warm, dry and intact.  Psychiatric: She has a normal mood and affect. Her behavior is normal. Judgment and thought content normal. Cognition and memory are normal.    Current Medications:   Current Outpatient Prescriptions  Medication Sig Dispense Refill  . albuterol (PROVENTIL HFA;VENTOLIN HFA) 108 (90 BASE) MCG/ACT inhaler Inhale 1-2 puffs into the lungs every 6 (six) hours as needed for wheezing or shortness of breath. 1 Inhaler 5  . aspirin 81 MG tablet Take 81 mg by mouth daily.      . butalbital-acetaminophen-caffeine (FIORICET WITH CODEINE) 50-325-40-30 MG per capsule Take 1 capsule by mouth every 6 (six) hours as needed.      . calcium carbonate (OS-CAL) 600 MG TABS tablet Take 600 mg by mouth daily with breakfast. Reported on 07/24/2015    . Cholecalciferol  (VITAMIN D) 1000 UNITS capsule Take 1,000 Units by mouth daily.      . clonazePAM (KLONOPIN) 1 MG tablet Take 1 mg by mouth daily as needed for anxiety. Take one tablet daily as needed    . diphenhydrAMINE (SOMINEX) 25 MG tablet Take 25 mg by mouth at bedtime as needed for sleep.    . fexofenadine (ALLEGRA) 180 MG tablet Take 180 mg by mouth daily.    . fluticasone (FLONASE) 50 MCG/ACT nasal spray Place 2 sprays into both nostrils daily. 16 g 5  . furosemide (LASIX) 20 MG tablet Take 20 mg by mouth. Take one tablet once daily as needed for swelling    . gabapentin (NEURONTIN) 100 MG capsule Take 300 mg by mouth 2 (two) times daily.     [name] ipratropium-albuterol (DUONEB) 0.5-2.5 (3) MG/3ML SOLN Take 3 mLs by nebulization every 4 (four) hours as needed.     . Iron-FA-B Cmp-C-Biot-Probiotic (FUSION PLUS) CAPS Take 1 capsule by mouth daily.    [name] levothyroxine (SYNTHROID, LEVOTHROID) 175 MCG tablet Take 137 mcg by mouth daily before breakfast.     . losartan (COZAAR) 100 MG tablet Take 100 mg by mouth daily.    . magnesium oxide (MAG-OX) 400 MG tablet Take 400 mg by mouth daily.      . metFORMIN (GLUCOPHAGE-XR) 500 MG 24 hr tablet 1 tablet daily.    [name] omeprazole (PRILOSEC) 20 MG capsule Take 1 capsule (20 mg total) by mouth daily. 30 capsule 6  . Oxycodone-Acetaminophen (PERCOCET PO)  Take 5-325 mg by mouth every 8 (eight) hours as needed.     . pravastatin (PRAVACHOL) 40 MG tablet TAKE 1 TABLET ONCE DAILY. 30 tablet 11  . predniSONE (DELTASONE) 20 MG tablet Take 1 tablet (20 mg total) by mouth daily. 30 tablet 5  . sertraline (ZOLOFT) 50 MG tablet Take 50 mg by mouth 2 (two) times daily. Reported on 09/07/2015    . Tiotropium Bromide-Olodaterol (STIOLTO RESPIMAT) 2.5-2.5 MCG/ACT AERS Inhale 2 puffs into the lungs daily. 4 g 6  . Calcium Carbonate (CALCIUM 500 PO) Take 1 capsule by mouth daily. Reported on 09/29/2015    . diclofenac (FLECTOR) 1.3 % PTCH Place 1 patch onto the skin daily as needed. Reported  on 09/29/2015    . predniSONE (DELTASONE) 5 MG tablet Reported on 09/29/2015     No current facility-administered medications for this visit.    Functional Status:   In your present state of health, do you have any difficulty performing the following activities: 09/14/2015 07/24/2015  Hearing? N N  Vision? N N  Difficulty concentrating or making decisions? [name]  Walking or climbing stairs? Y Y  Dressing or bathing? N N  Doing errands, shopping? [name]  Preparing Food and eating ? N N  Using the Toilet? N N  In the past six months, have you accidently leaked urine? Y Y  Do you have problems with loss of bowel control? Y Y  Managing your Medications? N N  Managing your Finances? N N  Housekeeping or managing your Housekeeping? [name]    Fall/Depression Screening:    [facility] 2/9 Scores 09/29/2015 07/24/2015 06/22/2015 03/23/2015 12/31/2014  PHQ - 2 Score '1 2 2 1 1  ' PHQ- 9 Score - '8 5 3 3    ' Assessment:  Routine home visit patient at kitchen table sitting in her scooter chair. Patient noted during visit using her rolling walker, fall prevention discussed.   COPD Patient reports breathing in green zone on today, wearing her continuous oxygen at 3 liters. Patient reports occasional cough that is productive at times but no increases. Reviewed with patient COPD zones and symptoms in yellow zone to take action and red zone call 911. Patient states she was reclutant to go the emergency room, at last admission she didn't think she was that sick, but she realizes now "I was a little bit out of my head and stubborn". Patient reports she has received a call that her sleep study on May 22, her daughter will be able to provide transportation.  Diabetes Patient checking her blood sugar about once a week as he has agreed, this week reading 98. Patient denies episode of hypoglycemia.  Lower extremity swelling Bilateral lower legs with swelling, tight non pitting, recent weight 179, with no increases in weight or  swelling.  Plan:  Patient will be able to recognize symptoms of yellow zone of COPD and action to take [facility] has scheduled home visit for 4/27 for community visit, evaluate progress on patient centered goals and transfer to health coach for continued disease management for COPD if no further care management needs identified, discussed with patient she is in agreement.     [facility] CM Care Plan Problem One        Most Recent Value   Care Plan Problem One  COPD self care management and education   Role Documenting the Problem One  Care Management [place] for Problem One  Active   [facility] Long Term  Goal (31-90 days)  Patient will report improved knowledge of actions to take for COPD symptoms   [facility] Long Term Goal Start Date  09/29/15   Interventions for Problem One Long Term Goal  Educated patient on importance of recognizing symptoms worsening and taking action sooner, discussed importance of knowing which zone she is in daily..   [facility] CM Short Term Goal #1 (0-30 days)  Patient will be able to teach back symptoms of yellow and red zones in the next 30 days   [facility] CM Short Term Goal #1 Start Date  09/29/15   Interventions for Short Term Goal #1  Reducated on the yellow and red  zone symptoms using magnet on her refrigerator as a reminder   [facility] CM Short Term Goal #2 (0-30 days)  Patient will be able to state actions to take for symptoms in the yellow and red zones in the next 30 days   [facility] CM Short Term Goal #2 Start Date  09/29/15   Interventions for Short Term Goal #2  Educated on the actions to take for symptoms in the yellow and red zone and the importance of following through.    [facility] CM Care Plan Problem Three        Most Recent Value   Care Plan Problem Three  High Risk for Readmission to hospital    Role Documenting the Problem Three  Care Management [place] for Problem Three  Active   [facility] Long Term Goal (31-90) days  Patient will not experience a hospital admission  in the next [place] Term Goal Start Date  09/07/15   Interventions for Problem Three Long Term Goal  Educated on the transition of care program, importance of PCP follow up after hospital admission, taking medications as prescribed, and notifying MD sooner for new symptoms   [facility] CM Short Term Goal #1 (0-30 days)  Patient will follow up with PCP in the next 14 days   [facility] CM Short Term Goal #1 Start Date  09/07/15   [facility] CM Short Term Goal #1 Met Date  09/24/15 [appointment on 3/16, has seen Pulmonologist post hosp.]   [facility] CM Short Term Goal #2 (0-30 days)  Patient will report home health RN visit by 3/9   [facility] CM Short Term Goal #2 Start Date  09/07/15   [facility] CM Short Term Goal #2 Met Date  09/14/15   [facility] CM Short Term Goal #3 (0-30 days)  Patient will have sleep study scheduled in the next 14 days   [facility]  CM Short Term Goal #3 Start Date  09/24/15   [facility] CM Short Term Goal #3 Met Date  09/29/15 [scheduled for may 22]     Joylene Draft, RN, [place] Management [phone]- Mobile [phone]- [place]

## 2015-10-08 ENCOUNTER — Other Ambulatory Visit: Payer: Self-pay | Admitting: *Deleted

## 2015-10-08 MED ORDER — PREDNISONE 20 MG PO TABS: 20.0000 mg | ORAL_TABLET | Freq: Every day | ORAL | Status: DC

## 2015-10-16 DIAGNOSIS — E1142 Type 2 diabetes mellitus with diabetic polyneuropathy: Secondary | ICD-10-CM | POA: Diagnosis not present

## 2015-10-16 DIAGNOSIS — F331 Major depressive disorder, recurrent, moderate: Secondary | ICD-10-CM | POA: Diagnosis not present

## 2015-10-16 DIAGNOSIS — F17211 Nicotine dependence, cigarettes, in remission: Secondary | ICD-10-CM | POA: Diagnosis not present

## 2015-10-16 DIAGNOSIS — J449 Chronic obstructive pulmonary disease, unspecified: Secondary | ICD-10-CM | POA: Diagnosis not present

## 2015-10-16 DIAGNOSIS — E034 Atrophy of thyroid (acquired): Secondary | ICD-10-CM | POA: Diagnosis not present

## 2015-10-16 DIAGNOSIS — E782 Mixed hyperlipidemia: Secondary | ICD-10-CM | POA: Diagnosis not present

## 2015-10-16 DIAGNOSIS — I10 Essential (primary) hypertension: Secondary | ICD-10-CM | POA: Diagnosis not present

## 2015-10-19 ENCOUNTER — Ambulatory Visit (INDEPENDENT_AMBULATORY_CARE_PROVIDER_SITE_OTHER): Payer: Medicare Other | Admitting: Emergency Medicine

## 2015-10-19 ENCOUNTER — Encounter: Payer: Self-pay | Admitting: Emergency Medicine

## 2015-10-19 VITALS — BP 132/70 | HR 110 | Ht 64.0 in | Wt 188.0 lb

## 2015-10-19 DIAGNOSIS — J9611 Chronic respiratory failure with hypoxia: Secondary | ICD-10-CM | POA: Diagnosis not present

## 2015-10-19 DIAGNOSIS — J9612 Chronic respiratory failure with hypercapnia: Secondary | ICD-10-CM

## 2015-10-19 DIAGNOSIS — J309 Allergic rhinitis, unspecified: Secondary | ICD-10-CM | POA: Diagnosis not present

## 2015-10-19 DIAGNOSIS — J441 Chronic obstructive pulmonary disease with (acute) exacerbation: Secondary | ICD-10-CM | POA: Diagnosis not present

## 2015-10-19 NOTE — Patient Instructions (Addendum)
Continue your prednisone.  Continue your oxygen at all times Continue your Stiolto as you are taking it Get your sleep study as planned  Continue your allegra and flonase Continue your nasal washes daily Start singulair 10mg  every evening Continue your DuoNebs as needed for shortness of breath.  We will look into getting a portable oxygen concentrator to use 3L/min pulsed.  Follow with Dr Delton CoombesByrum in 3 months or sooner if you have any problems.

## 2015-10-19 NOTE — Progress Notes (Signed)
Morgan Hays is a 75 year old woman who follows up today for her COPD, dyspnea, and for a history of a RLL pulmonary nodule. 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 No sgy has been planned due to her significant lung disease. Also with hx depression.     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 She was hospitalized in April for AE-COPD, hypercapnia.  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 She is supposed to have an epidural injection soon. Wearing 3-4L/min. Using breo and spiriva, SABA prn.   ROV 03/31/15 -- follow-up visit for chronic hypoxemia, interstitial lung disease, COPD. She is receiving percocet prn for her back pain. Has not received injections since last time. She is on pred 20, spiriva, breo, duoneb prn. She uses about 1-2x a day. Her O2 is at . She is unclear that she has benefited from breo. She believes that she is a bit worse.  She had an episode of acute resp failure that landed [facility]. They tried to use BiPAP and she refused it.   ROV1/31/17 -- his follow-up visit for interstitial lung disease and COPD with associated chronic hypoxemic respiratory failure. We've managed her on chronic prednisone currently at 20 mg daily. She also takes Spiriva, duoneb prn.  Last time we stopped Breo to see if she tolerated. She has been having breakthrough sinus drainage, nasal congestion.  She has tolerated off Breo. She uses duoneb about bid. She wonders if zyrtec is still effective.   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 [facility], was found to be hypercapnic at baseline at that time and it was recommended that she start on BiPAP daily at bedtime. She's also been managed on chronic prednisone 20mg , DuoNeb when necessary. At her last visit it was  recommended that she should try stopping Spiriva and start Stiolto. She very much prefers the [employer]. She is having nasal gtt. Is on allegra, doesn't feel that it kelps her. flonase nasal spray + [place].     EXAM  Filed Vitals:   10/19/15 1332  BP: 132/70  Pulse: 110  Height: 5\' 4"  (1.626 m)  Weight: 188 lb (85.276 kg)  SpO2: 91%   Filed Vitals:   10/19/15 1332  BP: 132/70  Pulse: 110  Height: 5\' 4"  (1.626 m)  Weight: 188 lb (85.276 kg)  SpO2: 91%    Gen: Pleasant, obese, in no distress, elderly and chronically ill appearing   ENT: No lesions,  mouth clear,  oropharynx clear, no postnasal drip  Neck: No JVD, no TMG, no carotid bruits  Lungs: Distant  BS w/ no wheezing, very distant  Cardiovascular: RRR, heart sounds normal, no murmur or gallops, trace edema  Musculoskeletal: No deformities, no cyanosis or clubbing  Neuro: alert, non focal  Skin: Warm, no lesions or rashes   12/19/11 Myoview:  Overall Impression: Normal stress nuclear study with a small, mild, fixed apical defect consistent with thinning; no ischemia.  LV Ejection Fraction: 79%. LV Wall Motion: NL LV Function; NL Wall Motion  CT chest 11/06/12 >>  No PE, centra-lobular emphysema, no infiltrates or nodules, stable thoracic aortic aneurysm   COPD (chronic obstructive pulmonary disease) Continue your prednisone.  Continue your oxygen at all times Continue your Stiolto as  you are taking it Continue your DuoNebs as needed for shortness of breath.  We will look into getting a portable oxygen concentrator to use 3L/min pulsed.  Follow with Dr Delton Coombes in 3 months or sooner if you have any problems.  Allergic rhinitis Continue your allegra and flonase Continue your nasal washes daily Start singulair  every evening  Chronic respiratory failure Should be able to get her a BiPAP machine based on her elevated PCO2 at rest with a normal pH. Hopefully we can avoid requirement for sleep study

## 2015-10-19 NOTE — Assessment & Plan Note (Signed)
Should be able to get her a BiPAP machine based on her elevated PCO2 at rest with a normal pH. Hopefully we can avoid requirement for sleep study

## 2015-10-19 NOTE — Assessment & Plan Note (Signed)
Continue your allegra and flonase Continue your nasal washes daily Start singulair 10mg  every evening

## 2015-10-19 NOTE — Assessment & Plan Note (Signed)
Continue your prednisone.  Continue your oxygen at all times Continue your Stiolto as you are taking it Continue your DuoNebs as needed for shortness of breath.  We will look into getting a portable oxygen concentrator to use 3L/min pulsed.  Follow with Dr Delton CoombesByrum in 3 months or sooner if you have any problems.

## 2015-10-22 ENCOUNTER — Telehealth: Payer: Self-pay | Admitting: Emergency Medicine

## 2015-10-22 MED ORDER — MONTELUKAST SODIUM 10 MG PO TABS: 10.0000 mg | ORAL_TABLET | Freq: Every day | ORAL | Status: DC

## 2015-10-22 NOTE — Telephone Encounter (Signed)
rx was sent  Pt aware  Nothing further needed 

## 2015-10-29 ENCOUNTER — Other Ambulatory Visit: Payer: Self-pay | Admitting: *Deleted

## 2015-10-29 ENCOUNTER — Encounter: Payer: Self-pay | Admitting: *Deleted

## 2015-10-29 ENCOUNTER — Telehealth: Payer: Self-pay | Admitting: Emergency Medicine

## 2015-10-29 DIAGNOSIS — J441 Chronic obstructive pulmonary disease with (acute) exacerbation: Secondary | ICD-10-CM

## 2015-10-29 NOTE — Telephone Encounter (Signed)
Spoke with Kin @ [facility] and she has spoken with the pt and pt is wanting eval for POC. Per LOV note you had mentioned possibly obtaining this. Please advise if ok to order POC eval.  RB please advise. Thanks!

## 2015-10-29 NOTE — Telephone Encounter (Signed)
[facility] with [facility] @ [facility] and advised of RB's recommendations. Order placed for POC eval for pulsed vs continuous. Nothing further needed.

## 2015-10-29 NOTE — Telephone Encounter (Signed)
I am okay with supportive blocks and concentrator evaluation but they need to make note of the fact that she may not be able to tolerate pulsed oxygen therapy. If that is the case then the need to find concentrator that will deliver continuous flow. Make sure that the patient attention to this as they make arrangements for her to get a new device. We can write the order for the evaluation.

## 2015-10-29 NOTE — Patient Outreach (Signed)
[facility]) Care Management   10/29/2015  Morgan Hays 05/22/1941 [id number]  Morgan Hays is an 75 y.o. female  Subjective: Patient reports feeling pretty good, with usual breathing. Patient discussed concern regarding possibly of  having to see a kidney specialist related to changes in her lab work from recent visit at PCP office.  Objective:  BP 122/70 mmHg  Pulse 99  Resp 20  SpO2 98% Review of Systems  Constitutional: Negative.   HENT: Negative.   Eyes: Negative.   Respiratory: Negative for cough.   Cardiovascular: Positive for leg swelling. Negative for chest pain.  Gastrointestinal: Negative.        Ileostomy  Genitourinary: Negative.   Musculoskeletal: Positive for back pain. Negative for falls.  Skin: Negative.   Neurological: Negative.   Endo/Heme/Allergies: Negative.   Psychiatric/Behavioral: Negative.     Physical Exam  Constitutional: She is oriented to person, place, and time. She appears well-developed and well-nourished.  Cardiovascular: Normal rate and normal heart sounds.   Respiratory: She has no wheezes.  Decreased breath sounds  GI: Soft.  Neurological: She is alert and oriented to person, place, and time.  Skin: Skin is warm and dry.  Psychiatric: She has a normal mood and affect. Her behavior is normal. Judgment and thought content normal.    Encounter Medications:   Outpatient Encounter Prescriptions as of 10/29/2015  Medication Sig Note  . albuterol (PROVENTIL HFA;VENTOLIN HFA) 108 (90 BASE) MCG/ACT inhaler Inhale 1-2 puffs into the lungs every 6 (six) hours as needed for wheezing or shortness of breath.   [name] aspirin 81 MG tablet Take 81 mg by mouth daily.     . butalbital-acetaminophen-caffeine (FIORICET WITH CODEINE) 50-325-40-30 MG per capsule Take 1 capsule by mouth every 6 (six) hours as needed.     . Calcium Carbonate (CALCIUM 500 PO) Take 1 capsule by mouth daily. Reported on 09/29/2015   . calcium carbonate (OS-CAL) 600 MG  TABS tablet Take 600 mg by mouth daily with breakfast. Reported on 07/24/2015   . Cholecalciferol (VITAMIN D) 1000 UNITS capsule Take 1,000 Units by mouth daily.     . clonazePAM (KLONOPIN) 1 MG tablet Take 1 mg by mouth daily as needed for anxiety. Take one tablet daily as needed   . diclofenac (FLECTOR) 1.3 % PTCH Place 1 patch onto the skin daily as needed. Reported on 09/29/2015 03/23/2015: Reports that she is  out to medication at present due to cost, working with [facility] pharmacist for medication assistance.  . diphenhydrAMINE (SOMINEX) 25 MG tablet Take 25 mg by mouth at bedtime as needed for sleep.   . fexofenadine (ALLEGRA) 180 MG tablet Take 180 mg by mouth daily.   . fluticasone (FLONASE) 50 MCG/ACT nasal spray Place 2 sprays into both nostrils daily.   . furosemide (LASIX) 20 MG tablet Take 40 mg by mouth. Take one tablet once daily as needed for swelling   . gabapentin (NEURONTIN) 100 MG capsule Take 300 mg by mouth 2 (two) times daily.    [name] ipratropium-albuterol (DUONEB) 0.5-2.5 (3) MG/3ML SOLN Take 3 mLs by nebulization every 4 (four) hours as needed.    . Iron-FA-B Cmp-C-Biot-Probiotic (FUSION PLUS) CAPS Take 1 capsule by mouth daily.   [name] levothyroxine (SYNTHROID, LEVOTHROID) 175 MCG tablet Take 137 mcg by mouth daily before breakfast.    . losartan (COZAAR) 100 MG tablet Take 100 mg by mouth daily.   . magnesium oxide (MAG-OX) 400 MG tablet Take 400 mg by mouth daily.     [name]  metFORMIN (GLUCOPHAGE-XR) 500 MG 24 hr tablet 1 tablet daily. 10/23/2013: Received from: External Pharmacy Received Sig:   . montelukast (SINGULAIR) 10 MG tablet Take 1 tablet (10 mg total) by mouth at bedtime.   [name] omeprazole (PRILOSEC) 20 MG capsule Take 1 capsule (20 mg total) by mouth daily.   . Oxycodone-Acetaminophen (PERCOCET PO) Take 5-325 mg by mouth every 8 (eight) hours as needed.    . pravastatin (PRAVACHOL) 40 MG tablet TAKE 1 TABLET ONCE DAILY.   [name] predniSONE (DELTASONE) 20 MG tablet Take 1 tablet (20 mg  total) by mouth daily.   . sertraline (ZOLOFT) 50 MG tablet Take 50 mg by mouth 2 (two) times daily. Reported on 09/07/2015   . Tiotropium Bromide-Olodaterol (STIOLTO RESPIMAT) 2.5-2.5 MCG/ACT AERS Inhale 2 puffs into the lungs daily.    No facility-administered encounter medications on file as of 10/29/2015.    Functional Status:   In your present state of health, do you have any difficulty performing the following activities: 09/14/2015 07/24/2015  Hearing? N N  Vision? N N  Difficulty concentrating or making decisions? Morgan Hays  Walking or climbing stairs? Y Y  Dressing or bathing? N N  Doing errands, shopping? Morgan Hays  Preparing Food and eating ? N N  Using the Toilet? N N  In the past six months, have you accidently leaked urine? Y Y  Do you have problems with loss of bowel control? Y Y  Managing your Medications? N N  Managing your Finances? N N  Housekeeping or managing your Housekeeping? Morgan Hays   Fall Risk  09/29/2015 07/24/2015 03/23/2015 01/27/2015 12/31/2014  Falls in the past year? Yes Yes Yes Yes Yes  Number falls in past yr: '1 1 1 1 1  ' Injury with Fall? No No No No -  Risk for fall due to : Impaired balance/gait;History of fall(s) History of fall(s);Impaired balance/gait History of fall(s);Impaired balance/gait History of fall(s);Impaired balance/gait Impaired balance/gait;History of fall(s);Impaired mobility  Risk for fall due to (comments): - - - uses rolling walker -  Follow up Falls prevention discussed Falls evaluation completed Falls evaluation completed Falls evaluation completed Education provided    Fall/Depression Screening:    PHQ 2/9 Scores 09/29/2015 07/24/2015 06/22/2015 03/23/2015 12/31/2014  PHQ - 2 Score '1 2 2 1 1  ' PHQ- 9 Score - '8 5 3 3    ' Assessment:    COPD No exacerbations of symptoms, patient reports requiring less nebulizer treatments throughout the day. Reports using nettipot with good effect. Patient request assistance with follow up on her request for portable  concentrator at her last pulmonary visit. Patient with upcoming sleep study in May for bipap evaluation.    Diabetes  Patient continues to monitor her blood pressure at least once weekly , recent reading of 115, and denies hypoglycemic  episodes.   Leg Swelling Some decrease in swelling, patient reports that she has not weighed in a few weeks, reinforced to monitor weight on regular basis to determine, fluid gains as she is taking lasix daily.   Discussed with patient health option of health coach for continued disease management of COPD, patient voiced understanding and is agreeable when care management goals are met. Patient awaiting testing to begin bipap at night time, will continue to follow for education and support.  Answered patient questions regarding normal kidney function results, based on bun,creatine values, reviewed her  results found in [place] from 1/16  and called PCP office to have patient sent a copy of her  recent labs per her request.  Plan:  Notified Dr.Byrum office, regarding patient request for portable concentrator, spoke with Sheena , and that verified she has spoken with Dr.Byrum and order will be [facility] to [facility] to evaluate patient for pulsed vs continuous flow portable concentrator, I have updated Mr.Juarbe  Will follow up with patient within one week.  THN CM Care Plan Problem One        Most Recent Value   Care Plan Problem One  COPD self care management and education   Role Documenting the Problem One  Care Management Coordinator   Care Plan for Problem One  Active   THN Long Term Goal (31-90 days)  Patient will report improved knowledge of actions to take for COPD symptoms   THN Long Term Goal Start Date  09/29/15   THN Long Term Goal Met Date  10/29/15   THN CM Short Term Goal #1 (0-30 days)  Patient will be able to teach back symptoms of yellow and red zones in the next 30 days   THN CM Short Term Goal #1 Start Date  09/29/15   [facility] CM Short  Term Goal #1 Met Date  10/29/15   THN CM Short Term Goal #2 (0-30 days)  Patient will be able to state actions to take for symptoms in the yellow and red zones in the next 30 days   THN CM Short Term Goal #2 Start Date  09/29/15   [facility] CM Short Term Goal #2 Met Date  10/29/15   THN CM Short Term Goal #3 (0-30 days)  Patient will report contact from DME agency regarding portable concentrator in the next 14 days   THN CM Short Term Goal #3 Start Date  10/29/15   Interventions for Short Tern Goal #3  Placed call to Pulmonary office to follow up on portable concentrator, and updated patient     [facility] CM Care Plan Problem Three        Most Recent Value   Care Plan Problem Three  High Risk for Readmission to hospital    Role Documenting the Problem Three  Care Management [place] for Problem Three  Active   THN Long Term Goal (31-90) days  Patient will not experience a hospital admission in the next [place] Term Goal Start Date  09/07/15   [facility] Long Term Goal Met Date  10/29/15   THN CM Short Term Goal #1 (0-30 days)  Patient will follow up with PCP in the next 14 days   THN CM Short Term Goal #1 Start Date  09/07/15   [facility] CM Short Term Goal #1 Met Date  09/24/15 [appointment on 3/16, has seen Pulmonologist post hosp.]   THN CM Short Term Goal #2 (0-30 days)  Patient will report home health RN visit by 3/9   [facility] CM Short Term Goal #2 Start Date  09/07/15   [facility] CM Short Term Goal #2 Met Date  09/14/15   THN CM Short Term Goal #3 (0-30 days)  Patient will have sleep study scheduled in the next 14 days   THN  CM Short Term Goal #3 Start Date  09/24/15   [facility] CM Short Term Goal #3 Met Date  09/29/15 [scheduled for may 22]    Joylene Draft, RN, [place] Management [phone]- Mobile [phone]- [place]

## 2015-11-03 ENCOUNTER — Telehealth: Payer: Self-pay | Admitting: Emergency Medicine

## 2015-11-03 NOTE — Telephone Encounter (Signed)
Spoke with Tiffany with [facility], states that an order was placed for pt for an ONO but it does not specify NOCTURNAL, so this needs to be changed.  I do not see where we ordered a ONO for pt.  Tiffany states the order came from [facility].  I advised that we ordered a POC eval, not an ONO.  This order was sent to [facility] on 4/27.  Called [facility] to verify that pt needs a POC eval and not an ONO.  [facility] rep states that this was ordered by mistake but has been changed and processed as a POC eval.  Nothing further needed.

## 2015-11-04 ENCOUNTER — Other Ambulatory Visit: Payer: Self-pay | Admitting: *Deleted

## 2015-11-04 NOTE — Patient Outreach (Signed)
[facility]) Care Management  11/04/2015  Morgan Hays [birth year] [id number]   Follow up telephone call to Morgan Hays, she reports that she is having a good day, usual breathing. Patient discussed that she received her lab work from her PCP appointment, and is not as concerned as she was. Patient discussed her upcoming sleep study evaluation in preparation for her bipap at night.   Assessment Patient will benefit from further education and support related to Bipap.  Patient has not heard from [facility] regarding portable concentrator.    Plan RNCM will schedule home visit in 3 weeks to provide education and support  related to bipap prior to referral  to health coach, patient in agreement.  RNCM followed up with [facility] regarding receiving orders for portable concentrator , they have received orders and in process and will notify patient, RNCM updated patient.  THN CM Care Plan Problem One        Most Recent Value   Care Plan Problem One  COPD self care management and education   Role Documenting the Problem One  Care Management Coordinator   Care Plan for Problem One  Active   THN Long Term Goal (31-90 days)  Patient will report improved knowledge of actions to take for COPD symptoms   THN Long Term Goal Start Date  09/29/15   THN Long Term Goal Met Date  10/29/15   THN CM Short Term Goal #1 (0-30 days)  Patient will be able to teach back symptoms of yellow and red zones in the next 30 days   THN CM Short Term Goal #1 Start Date  09/29/15   Morgan Hays CM Short Term Goal #1 Met Date  10/29/15   THN CM Short Term Goal #2 (0-30 days)  Patient will be able to state actions to take for symptoms in the yellow and red zones in the next 30 days   THN CM Short Term Goal #2 Start Date  09/29/15   [facility] CM Short Term Goal #2 Met Date  10/29/15   THN CM Short Term Goal #3 (0-30 days)  Patient will report contact from DME agency regarding portable concentrator  in the next 14 days   THN CM Short Term Goal #3 Start Date  11/05/15   Interventions for Short Tern Goal #3  RNCM placed call to [facility] and orders have been received from MD office and in process, I will update patient.   THN CM Short Term Goal #4 (0-30 days)  Patient will report increased knowledge of benefits of bipap at night in the next 30 days   THN CM Short Term Goal #4 Start Date  11/04/15   Interventions for Short Term Goal #4  RNCM to provide education on benefits of bipap at night, with copd patients    [facility] CM Care Plan Problem Three        Most Recent Value   Care Plan Problem Three  High Risk for Readmission to [facility]    Role Documenting the Problem Three  Care Management [place] for Problem Three  Active   THN Long Term Goal (31-90) days  Patient will not experience a [place] admission in the next [place] Term Goal Start Date  09/07/15   [facility] Long Term Goal Met Date  10/29/15   THN CM Short Term Goal #1 (0-30 days)  Patient will follow up with PCP in the next 14 days  THN CM Short Term Goal #1 Start Date  09/07/15   [facility] CM Short Term Goal #1 Met Date  09/24/15 [appointment on 3/16, has seen Pulmonologist post hosp.]   THN CM Short Term Goal #2 (0-30 days)  Patient will report home health RN visit by 3/9   [facility] CM Short Term Goal #2 Start Date  09/07/15   [facility] CM Short Term Goal #2 Met Date  09/14/15   THN CM Short Term Goal #3 (0-30 days)  Patient will have sleep study scheduled in the next 14 days   THN  CM Short Term Goal #3 Start Date  09/24/15   [facility] CM Short Term Goal #3 Met Date  09/29/15 [scheduled for may 22]     Joylene Draft, RN, [place] Management [phone]- Mobile [phone]- [place]

## 2015-11-05 ENCOUNTER — Other Ambulatory Visit: Payer: Self-pay | Admitting: *Deleted

## 2015-11-05 DIAGNOSIS — R05 Cough: Secondary | ICD-10-CM

## 2015-11-05 DIAGNOSIS — R059 Cough, unspecified: Secondary | ICD-10-CM

## 2015-11-05 MED ORDER — OMEPRAZOLE 20 MG PO CPDR: 20.0000 mg | DELAYED_RELEASE_CAPSULE | Freq: Every day | ORAL | Status: AC

## 2015-11-19 ENCOUNTER — Other Ambulatory Visit: Payer: Self-pay | Admitting: *Deleted

## 2015-11-19 MED ORDER — ALBUTEROL SULFATE HFA 108 (90 BASE) MCG/ACT IN AERS: 1.0000 | INHALATION_SPRAY | Freq: Four times a day (QID) | RESPIRATORY_TRACT | Status: AC | PRN

## 2015-11-23 ENCOUNTER — Ambulatory Visit (HOSPITAL_BASED_OUTPATIENT_CLINIC_OR_DEPARTMENT_OTHER): Payer: Medicare Other | Attending: Adult Health | Admitting: Pulmonary Disease

## 2015-11-23 VITALS — Ht 64.0 in | Wt 180.0 lb

## 2015-11-23 DIAGNOSIS — R0902 Hypoxemia: Secondary | ICD-10-CM

## 2015-11-23 DIAGNOSIS — J449 Chronic obstructive pulmonary disease, unspecified: Secondary | ICD-10-CM | POA: Diagnosis not present

## 2015-11-23 DIAGNOSIS — R0689 Other abnormalities of breathing: Secondary | ICD-10-CM | POA: Diagnosis not present

## 2015-11-23 DIAGNOSIS — J9621 Acute and chronic respiratory failure with hypoxia: Secondary | ICD-10-CM | POA: Insufficient documentation

## 2015-11-23 DIAGNOSIS — J9622 Acute and chronic respiratory failure with hypercapnia: Secondary | ICD-10-CM | POA: Insufficient documentation

## 2015-11-25 ENCOUNTER — Other Ambulatory Visit: Payer: Self-pay | Admitting: *Deleted

## 2015-11-25 ENCOUNTER — Encounter: Payer: Self-pay | Admitting: *Deleted

## 2015-11-25 NOTE — Patient Outreach (Signed)
[facility] Care Management   11/25/2015  Leaann Nevils [birth year] [id number]  Azra Abrell is an 75 y.o. female  Subjective:  I am moving a little slow on today. Complaint of cough nonproductive, but I am not in the yellow zone. Patient discussed her recent sleep study and plans for bipap at home.  Objective:  BP 122/60 mmHg  Pulse 95  Resp 20  SpO2 95%  Patient using rolling walker. Review of Systems  Constitutional: Negative.   HENT: Negative.   Eyes: Negative.   Respiratory: Positive for cough.   Cardiovascular: Positive for leg swelling.       Lower legs  Gastrointestinal: Negative.   Genitourinary: Negative.   Musculoskeletal: Positive for back pain.  Skin: Negative.   Neurological: Negative.   Psychiatric/Behavioral: Negative.     Physical Exam  Constitutional: She is oriented to person, place, and time. She appears well-developed and well-nourished.  HENT:  Runny nose- clear  Cardiovascular: Normal rate, normal heart sounds and intact distal pulses.   Respiratory: No respiratory distress. She has wheezes.  Expiratory wheezes  GI: Soft.  Neurological: She is alert and oriented to person, place, and time.  Skin: Skin is warm and dry.  Psychiatric: She has a normal mood and affect. Her behavior is normal. Judgment and thought content normal.    Encounter Medications:   Outpatient Encounter Prescriptions as of 11/25/2015  Medication Sig Note  . albuterol (PROVENTIL HFA;VENTOLIN HFA) 108 (90 Base) MCG/ACT inhaler Inhale 1-2 puffs into the lungs every 6 (six) hours as needed for wheezing or shortness of breath.   [name] aspirin 81 MG tablet Take 81 mg by mouth daily.     . butalbital-acetaminophen-caffeine (FIORICET WITH CODEINE) 50-325-40-30 MG per capsule Take 1 capsule by mouth every 6 (six) hours as needed.     . Calcium Carbonate (CALCIUM 500 PO) Take 1 capsule by mouth daily. Reported on 10/29/2015   . Cholecalciferol (VITAMIN D) 1000 UNITS capsule  Take 1,000 Units by mouth daily.     . clonazePAM (KLONOPIN) 1 MG tablet Take 1 mg by mouth daily as needed for anxiety. Take one tablet daily as needed   . fexofenadine (ALLEGRA) 180 MG tablet Take 180 mg by mouth daily.   . fluticasone (FLONASE) 50 MCG/ACT nasal spray Place 2 sprays into both nostrils daily.   . furosemide (LASIX) 20 MG tablet Take 40 mg by mouth. Take one tablet once daily as needed for swelling   . gabapentin (NEURONTIN) 100 MG capsule Take 300 mg by mouth 2 (two) times daily.    [name] ipratropium-albuterol (DUONEB) 0.5-2.5 (3) MG/3ML SOLN Take 3 mLs by nebulization every 4 (four) hours as needed.    . Iron-FA-B Cmp-C-Biot-Probiotic (FUSION PLUS) CAPS Take 1 capsule by mouth daily.   [name] levothyroxine (SYNTHROID, LEVOTHROID) 175 MCG tablet Take 150 mcg by mouth daily before breakfast.    . losartan (COZAAR) 100 MG tablet Take 100 mg by mouth daily.   . magnesium oxide (MAG-OX) 400 MG tablet Take 400 mg by mouth daily.     . metFORMIN (GLUCOPHAGE-XR) 500 MG 24 hr tablet 1 tablet daily. 10/23/2013: Received from: External Pharmacy Received Sig:   . montelukast (SINGULAIR) 10 MG tablet Take 1 tablet (10 mg total) by mouth at bedtime.   [name] omeprazole (PRILOSEC) 20 MG capsule Take 1 capsule (20 mg total) by mouth daily.   . Oxycodone-Acetaminophen (PERCOCET PO) Take 5-325 mg by mouth every 8 (eight) hours as needed.    [name]  pravastatin (PRAVACHOL) 40 MG tablet TAKE 1 TABLET ONCE DAILY.   [name] predniSONE (DELTASONE) 20 MG tablet Take 1 tablet (20 mg total) by mouth daily.   . sertraline (ZOLOFT) 50 MG tablet Take 50 mg by mouth 2 (two) times daily. Reported on 09/07/2015   . Tiotropium Bromide-Olodaterol (STIOLTO RESPIMAT) 2.5-2.5 MCG/ACT AERS Inhale 2 puffs into the lungs daily.   . calcium carbonate (OS-CAL) 600 MG TABS tablet Take 600 mg by mouth daily with breakfast. Reported on 11/25/2015   . diclofenac (FLECTOR) 1.3 % PTCH Place 1 patch onto the skin daily as needed. Reported on 11/25/2015  03/23/2015: Reports that she is  out to medication at present due to cost, working with [facility] pharmacist for medication assistance.  . diphenhydrAMINE (SOMINEX) 25 MG tablet Take 25 mg by mouth at bedtime as needed for sleep.    No facility-administered encounter medications on file as of 11/25/2015.    Functional Status:   In your present state of health, do you have any difficulty performing the following activities: 11/25/2015 09/14/2015  Hearing? N N  Vision? N N  Difficulty concentrating or making decisions? N Y  Walking or climbing stairs? Y Y  Dressing or bathing? N N  Doing errands, shopping? [name]  Preparing Food and eating ? Y N  Using the Toilet? N N  In the past six months, have you accidently leaked urine? N Y  Do you have problems with loss of bowel control? N Y  Managing your Medications? N N  Managing your Finances? N N  Housekeeping or managing your Housekeeping? [name]    Fall/Depression Screening:    PHQ 2/9 Scores 11/25/2015 09/29/2015 07/24/2015 06/22/2015 03/23/2015 12/31/2014  PHQ - 2 Score 0 '1 2 2 1 1  ' PHQ- 9 Score - - '8 5 3 3    ' Assessment: Routine home visit.  COPD - patient wearing continuous oxygen via nasal cannula, some expiratory wheezes noted, reviewed with patient symptoms of COPD to notify MD of to arrange a sooner office visit, patient declines need for visit at this time. Patient continues to take all medications nebulizer and inhalers as prescribed.  Diabetes Continues to check her blood sugar at least weekly, recent blood sugar 119, no hypoglycemic episodes. Reviewed her recent A1c 6.7, and reinforced carb modified diet especially limiting her  sweet snacks.  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 We  discussed home hospice and hospice home in [place], reviewed website with patient she declined any further information  need and states I will know when it time for that, support offered.  Care management goals being met, and denies any new needs.  Discussed transition to health coach for continued education and management of COPD, patient in agreement.   Plan:  Transition to health coach at next visit Patient will notify MD for yellow zone COPD symptoms.  [facility] CM Care Plan Problem One        Most Recent Value   Care Plan Problem One  COPD self care management and education   Role Documenting the Problem One  Care Management Coordinator   Care Plan for Problem One  Active   THN Long Term Goal (31-90 days)  Patient will report improved knowledge of actions to take for COPD symptoms   THN Long Term Goal Start Date  09/29/15   [facility]  Long Term Goal Met Date  10/29/15   THN CM Short Term Goal #1 (0-30 days)  Patient will be able to teach back symptoms of yellow and red zones in the next 30 days   THN CM Short Term Goal #1 Start Date  09/29/15   [facility] CM Short Term Goal #1 Met Date  10/29/15   THN CM Short Term Goal #2 (0-30 days)  Patient will be able to state actions to take for symptoms in the yellow and red zones in the next 30 days   THN CM Short Term Goal #2 Start Date  09/29/15   [facility] CM Short Term Goal #2 Met Date  10/29/15   THN CM Short Term Goal #3 (0-30 days)  Patient will report contact from DME agency regarding portable concentrator in the next 14 days   THN CM Short Term Goal #3 Start Date  11/05/15   [facility] CM Short Term Goal #3 Met Date  11/25/15   Interventions for Short Tern Goal #3  -- [patient has decided against portable concentrator]   THN CM Short Term Goal #4 (0-30 days)  Patient will report increased knowledge of benefits of bipap at night in the next 30 days   THN CM Short Term Goal #4 Start Date  11/04/15   Interventions for Short Term Goal #4  RN reviewed pictures of home bipap machines to answer her questions    [facility] CM Care Plan Problem Three        Most Recent Value   Care Plan Problem  Three  High Risk for Readmission to hospital    Role Documenting the Problem Three  Care Management [place] for Problem Three  Active   THN Long Term Goal (31-90) days  Patient will not experience a hospital admission in the next [place] Term Goal Start Date  09/07/15   [facility] Long Term Goal Met Date  10/29/15   THN CM Short Term Goal #1 (0-30 days)  Patient will follow up with PCP in the next 14 days   THN CM Short Term Goal #1 Start Date  09/07/15   [facility] CM Short Term Goal #1 Met Date  09/24/15 [appointment on 3/16, has seen Pulmonologist post hosp.]   THN CM Short Term Goal #2 (0-30 days)  Patient will report home health RN visit by 3/9   [facility] CM Short Term Goal #2 Start Date  09/07/15   [facility] CM Short Term Goal #2 Met Date  09/14/15   THN CM Short Term Goal #3 (0-30 days)  Patient will have sleep study scheduled in the next 14 days   THN  CM Short Term Goal #3 Start Date  09/24/15   [facility] CM Short Term Goal #3 Met Date  09/29/15 [scheduled for may 22]     Joylene Draft, RN, [place] Management [phone]- [place] [phone]- [place]

## 2015-11-26 NOTE — Procedures (Signed)
Patient Name: Morgan Hays, [name] Study Date: 11/23/2015 Gender: Female D.O.B: [birth year] Age (years): [age] Referring Provider: Babette Relicammy Parrett Height (inches): 64 Interpreting Physician: Coralyn HellingVineet Krystianna Soth MD, ABSM Weight (lbs): 180 RPSGT: Shelah LewandowskyGregory, Kenyon BMI: 31 MRN: [id number] Neck Size: 16.00  CLINICAL INFORMATION Sleep Study Type: NPSG   Indication for sleep study: She has history of COPD on 3 liters supplemental oxygen and elevated bicarbonate on BMET.  There is concern she could have chronic hypercapnia in additional to hypoxia.  She is sent to sleep lab to determine if she has sleep disordered breathing contributing to her hypoxia and hypercapnia.   Epworth Sleepiness Score: 12   SLEEP STUDY TECHNIQUE As per the AASM Manual for the Scoring of Sleep and Associated Events v2.3 (April 2016) with a hypopnea requiring 4% desaturations. The channels recorded and monitored were frontal, central and occipital EEG, electrooculogram (EOG), submentalis EMG (chin), nasal and oral airflow, thoracic and abdominal wall motion, anterior tibialis EMG, snore microphone, electrocardiogram, and pulse oximetry.  MEDICATIONS Patient's medications include: reviewed in medical record. Medications self-administered by patient during sleep study : No sleep medicine administered.  SLEEP ARCHITECTURE The study was initiated at 10:44:10 PM and ended at 4:56:55 AM. Sleep onset time was 11.4 minutes and the sleep efficiency was 70.7%. The total sleep time was 263.4 minutes. Stage REM latency was N/A minutes. The patient spent 16.47% of the night in stage N1 sleep, 83.53% in stage N2 sleep, 0.00% in stage N3 and 0.00% in REM. Alpha intrusion was absent. Supine sleep was 100.00%.  RESPIRATORY PARAMETERS The overall apnea/hypopnea index (AHI) was 0.9 per hour. There were 4 total apneas, including 4 obstructive, 0 central and 0 mixed apneas. There were 0 hypopneas and 14 RERAs. The AHI during Stage REM sleep was N/A per  hour. AHI while supine was 0.9 per hour. The mean oxygen saturation was 99.58%. The minimum SpO2 during sleep was 99.00%. Moderate snoring was noted during this study.  CARDIAC DATA The 2 lead EKG demonstrated sinus rhythm. The mean heart rate was 74.83 beats per minute. Other EKG findings include: PVCs.  LEG MOVEMENT DATA The total PLMS were 0 with a resulting PLMS index of 0.00. Associated arousal with leg movement index was 0.2 .  IMPRESSIONS This study did not show evidence for significant obstructive sleep apnea.  Her AHI was 0.9.  The study was conducted with using baseline supplemental oxygen of 3 liters.  Her SpO2 low with this was 99%.   DIAGNOSIS - Chronic respiratory failure (J96.10)  - COPD (J44.9)  RECOMMENDATIONS She should continue supplemental oxygen with sleep.  She should be further assess for daytime hypercapnia and then determine if she could benefit from assisted ventilation with BiPAP.   Coralyn HellingVineet Tyreece Gelles, MD, ABSM Diplomate, American Board of Sleep Medicine 11/26/2015, 3:00 PM  NPI: [id number]

## 2015-12-01 ENCOUNTER — Ambulatory Visit: Payer: Medicare Other | Admitting: Emergency Medicine

## 2015-12-01 DIAGNOSIS — E039 Hypothyroidism, unspecified: Secondary | ICD-10-CM | POA: Diagnosis present

## 2015-12-01 DIAGNOSIS — E785 Hyperlipidemia, unspecified: Secondary | ICD-10-CM | POA: Diagnosis present

## 2015-12-01 DIAGNOSIS — J9621 Acute and chronic respiratory failure with hypoxia: Secondary | ICD-10-CM | POA: Diagnosis present

## 2015-12-01 DIAGNOSIS — R918 Other nonspecific abnormal finding of lung field: Secondary | ICD-10-CM | POA: Diagnosis present

## 2015-12-01 DIAGNOSIS — I340 Nonrheumatic mitral (valve) insufficiency: Secondary | ICD-10-CM | POA: Diagnosis not present

## 2015-12-01 DIAGNOSIS — I351 Nonrheumatic aortic (valve) insufficiency: Secondary | ICD-10-CM | POA: Diagnosis not present

## 2015-12-01 DIAGNOSIS — Z888 Allergy status to other drugs, medicaments and biological substances status: Secondary | ICD-10-CM | POA: Diagnosis not present

## 2015-12-01 DIAGNOSIS — J9622 Acute and chronic respiratory failure with hypercapnia: Secondary | ICD-10-CM | POA: Diagnosis present

## 2015-12-01 DIAGNOSIS — J441 Chronic obstructive pulmonary disease with (acute) exacerbation: Secondary | ICD-10-CM | POA: Diagnosis not present

## 2015-12-01 DIAGNOSIS — I5032 Chronic diastolic (congestive) heart failure: Secondary | ICD-10-CM | POA: Diagnosis not present

## 2015-12-01 DIAGNOSIS — E1122 Type 2 diabetes mellitus with diabetic chronic kidney disease: Secondary | ICD-10-CM | POA: Diagnosis not present

## 2015-12-01 DIAGNOSIS — Z9981 Dependence on supplemental oxygen: Secondary | ICD-10-CM | POA: Diagnosis not present

## 2015-12-01 DIAGNOSIS — J9692 Respiratory failure, unspecified with hypercapnia: Secondary | ICD-10-CM | POA: Diagnosis not present

## 2015-12-01 DIAGNOSIS — I712 Thoracic aortic aneurysm, without rupture: Secondary | ICD-10-CM | POA: Diagnosis present

## 2015-12-01 DIAGNOSIS — Z66 Do not resuscitate: Secondary | ICD-10-CM | POA: Diagnosis present

## 2015-12-01 DIAGNOSIS — E860 Dehydration: Secondary | ICD-10-CM | POA: Diagnosis present

## 2015-12-01 DIAGNOSIS — D638 Anemia in other chronic diseases classified elsewhere: Secondary | ICD-10-CM | POA: Diagnosis present

## 2015-12-01 DIAGNOSIS — F418 Other specified anxiety disorders: Secondary | ICD-10-CM | POA: Diagnosis present

## 2015-12-01 DIAGNOSIS — R0602 Shortness of breath: Secondary | ICD-10-CM | POA: Diagnosis not present

## 2015-12-01 DIAGNOSIS — K5730 Diverticulosis of large intestine without perforation or abscess without bleeding: Secondary | ICD-10-CM | POA: Diagnosis present

## 2015-12-01 DIAGNOSIS — Z79899 Other long term (current) drug therapy: Secondary | ICD-10-CM | POA: Diagnosis not present

## 2015-12-01 DIAGNOSIS — R0600 Dyspnea, unspecified: Secondary | ICD-10-CM | POA: Diagnosis not present

## 2015-12-01 DIAGNOSIS — N183 Chronic kidney disease, stage 3 (moderate): Secondary | ICD-10-CM | POA: Diagnosis present

## 2015-12-01 DIAGNOSIS — N179 Acute kidney failure, unspecified: Secondary | ICD-10-CM | POA: Diagnosis not present

## 2015-12-01 DIAGNOSIS — E1165 Type 2 diabetes mellitus with hyperglycemia: Secondary | ICD-10-CM | POA: Diagnosis present

## 2015-12-01 DIAGNOSIS — Z87891 Personal history of nicotine dependence: Secondary | ICD-10-CM | POA: Diagnosis not present

## 2015-12-01 DIAGNOSIS — I129 Hypertensive chronic kidney disease with stage 1 through stage 4 chronic kidney disease, or unspecified chronic kidney disease: Secondary | ICD-10-CM | POA: Diagnosis present

## 2015-12-01 DIAGNOSIS — I361 Nonrheumatic tricuspid (valve) insufficiency: Secondary | ICD-10-CM | POA: Diagnosis not present

## 2015-12-01 DIAGNOSIS — M1990 Unspecified osteoarthritis, unspecified site: Secondary | ICD-10-CM | POA: Diagnosis present

## 2015-12-01 DIAGNOSIS — E7800 Pure hypercholesterolemia, unspecified: Secondary | ICD-10-CM | POA: Diagnosis present

## 2015-12-01 DIAGNOSIS — K5790 Diverticulosis of intestine, part unspecified, without perforation or abscess without bleeding: Secondary | ICD-10-CM | POA: Diagnosis present

## 2015-12-01 DIAGNOSIS — Z7982 Long term (current) use of aspirin: Secondary | ICD-10-CM | POA: Diagnosis not present

## 2015-12-01 DIAGNOSIS — N189 Chronic kidney disease, unspecified: Secondary | ICD-10-CM | POA: Diagnosis not present

## 2015-12-01 DIAGNOSIS — I350 Nonrheumatic aortic (valve) stenosis: Secondary | ICD-10-CM | POA: Diagnosis not present

## 2015-12-03 DIAGNOSIS — J9692 Respiratory failure, unspecified with hypercapnia: Secondary | ICD-10-CM

## 2015-12-03 DIAGNOSIS — E039 Hypothyroidism, unspecified: Secondary | ICD-10-CM

## 2015-12-03 DIAGNOSIS — N189 Chronic kidney disease, unspecified: Secondary | ICD-10-CM

## 2015-12-03 DIAGNOSIS — I10 Essential (primary) hypertension: Secondary | ICD-10-CM

## 2015-12-03 DIAGNOSIS — I712 Thoracic aortic aneurysm, without rupture: Secondary | ICD-10-CM

## 2015-12-03 DIAGNOSIS — E119 Type 2 diabetes mellitus without complications: Secondary | ICD-10-CM

## 2015-12-03 DIAGNOSIS — K5790 Diverticulosis of intestine, part unspecified, without perforation or abscess without bleeding: Secondary | ICD-10-CM

## 2015-12-03 DIAGNOSIS — N179 Acute kidney failure, unspecified: Secondary | ICD-10-CM

## 2015-12-03 DIAGNOSIS — J441 Chronic obstructive pulmonary disease with (acute) exacerbation: Secondary | ICD-10-CM

## 2015-12-04 DIAGNOSIS — N189 Chronic kidney disease, unspecified: Secondary | ICD-10-CM

## 2015-12-04 DIAGNOSIS — I10 Essential (primary) hypertension: Secondary | ICD-10-CM

## 2015-12-04 DIAGNOSIS — I712 Thoracic aortic aneurysm, without rupture: Secondary | ICD-10-CM

## 2015-12-04 DIAGNOSIS — K5790 Diverticulosis of intestine, part unspecified, without perforation or abscess without bleeding: Secondary | ICD-10-CM

## 2015-12-04 DIAGNOSIS — J441 Chronic obstructive pulmonary disease with (acute) exacerbation: Secondary | ICD-10-CM

## 2015-12-04 DIAGNOSIS — J9692 Respiratory failure, unspecified with hypercapnia: Secondary | ICD-10-CM

## 2015-12-04 DIAGNOSIS — N179 Acute kidney failure, unspecified: Secondary | ICD-10-CM

## 2015-12-04 DIAGNOSIS — E119 Type 2 diabetes mellitus without complications: Secondary | ICD-10-CM

## 2015-12-04 DIAGNOSIS — E039 Hypothyroidism, unspecified: Secondary | ICD-10-CM

## 2015-12-05 DIAGNOSIS — E119 Type 2 diabetes mellitus without complications: Secondary | ICD-10-CM

## 2015-12-05 DIAGNOSIS — I712 Thoracic aortic aneurysm, without rupture: Secondary | ICD-10-CM

## 2015-12-05 DIAGNOSIS — K5790 Diverticulosis of intestine, part unspecified, without perforation or abscess without bleeding: Secondary | ICD-10-CM

## 2015-12-05 DIAGNOSIS — J9692 Respiratory failure, unspecified with hypercapnia: Secondary | ICD-10-CM

## 2015-12-05 DIAGNOSIS — E039 Hypothyroidism, unspecified: Secondary | ICD-10-CM

## 2015-12-05 DIAGNOSIS — I10 Essential (primary) hypertension: Secondary | ICD-10-CM

## 2015-12-05 DIAGNOSIS — N189 Chronic kidney disease, unspecified: Secondary | ICD-10-CM

## 2015-12-05 DIAGNOSIS — J441 Chronic obstructive pulmonary disease with (acute) exacerbation: Secondary | ICD-10-CM

## 2015-12-05 DIAGNOSIS — N179 Acute kidney failure, unspecified: Secondary | ICD-10-CM

## 2015-12-07 DIAGNOSIS — E119 Type 2 diabetes mellitus without complications: Secondary | ICD-10-CM

## 2015-12-07 DIAGNOSIS — I712 Thoracic aortic aneurysm, without rupture: Secondary | ICD-10-CM

## 2015-12-07 DIAGNOSIS — N189 Chronic kidney disease, unspecified: Secondary | ICD-10-CM

## 2015-12-07 DIAGNOSIS — I10 Essential (primary) hypertension: Secondary | ICD-10-CM

## 2015-12-07 DIAGNOSIS — N179 Acute kidney failure, unspecified: Secondary | ICD-10-CM

## 2015-12-07 DIAGNOSIS — K5790 Diverticulosis of intestine, part unspecified, without perforation or abscess without bleeding: Secondary | ICD-10-CM

## 2015-12-07 DIAGNOSIS — J441 Chronic obstructive pulmonary disease with (acute) exacerbation: Secondary | ICD-10-CM

## 2015-12-07 DIAGNOSIS — J9692 Respiratory failure, unspecified with hypercapnia: Secondary | ICD-10-CM

## 2015-12-07 DIAGNOSIS — E039 Hypothyroidism, unspecified: Secondary | ICD-10-CM

## 2015-12-08 ENCOUNTER — Encounter: Payer: Self-pay | Admitting: *Deleted

## 2015-12-08 ENCOUNTER — Other Ambulatory Visit: Payer: Self-pay | Admitting: *Deleted

## 2015-12-08 NOTE — Patient Outreach (Signed)
[facility]) Care Management  [facility] Care Manager  12/08/2015   Morgan Hays May 10, 1941 [id number]  Transition of care initial note Morgan Hays was discharged from [facility] on 6/5 after recent admission for COPD flare,hypercapnic respiratory failure  Subjective:  Morgan Hays reports that she was discharged home on yesterday, reports being tired this morning.  Morgan Hays discussed I am going to get my BIpap, states she has already spoken to [facility] on today but unsure when they will deliver it states they will follow up with her.   Morgan Hays denies any increase in shortness of breath, cough or sputum production. Patient already has follow up appointment with PCP for 6/9 and with pulmonary for 6/19 2 weeks. Discussed with patient the importance of notifying MD of worsening symptoms, patient states she is in green on today and reports a good appetite.  Morgan Hays states she has all of her medications, including new prescriptions and understands how to take them.  Morgan Hays is to be followed by home health [place] home health RN and physical therapy. Patient was recently discharged from hospital and all medications have been reviewed. Encounter Medications:  Outpatient Encounter Prescriptions as of 12/08/2015  Medication Sig Note  . albuterol (PROVENTIL HFA;VENTOLIN HFA) 108 (90 Base) MCG/ACT inhaler Inhale 1-2 puffs into the lungs every 6 (six) hours as needed for wheezing or shortness of breath.   [name] aspirin 81 MG tablet Take 81 mg by mouth daily.     . butalbital-acetaminophen-caffeine (FIORICET WITH CODEINE) 50-325-40-30 MG per capsule Take 1 capsule by mouth every 6 (six) hours as needed.     . Calcium Carbonate (CALCIUM 500 PO) Take 1 capsule by mouth daily. Reported on 10/29/2015   . calcium carbonate (OS-CAL) 600 MG TABS tablet Take 600 mg by mouth daily with breakfast. Reported on 11/25/2015   . Cholecalciferol (VITAMIN D) 1000 UNITS capsule Take  1,000 Units by mouth daily.     . clonazePAM (KLONOPIN) 1 MG tablet Take 1 mg by mouth daily as needed for anxiety. Take one tablet daily as needed   . diclofenac (FLECTOR) 1.3 % PTCH Place 1 patch onto the skin daily as needed. Reported on 11/25/2015 03/23/2015: Reports that she is  out to medication at present due to cost, working with [facility] pharmacist for medication assistance.  . diphenhydrAMINE (SOMINEX) 25 MG tablet Take 25 mg by mouth at bedtime as needed for sleep. Reported on 11/25/2015   . fexofenadine (ALLEGRA) 180 MG tablet Take 180 mg by mouth daily.   . fluticasone (FLONASE) 50 MCG/ACT nasal spray Place 2 sprays into both nostrils daily.   . furosemide (LASIX) 20 MG tablet Take 40 mg by mouth. Take one tablet once daily as needed for swelling   . gabapentin (NEURONTIN) 100 MG capsule Take 300 mg by mouth 2 (two) times daily.    [name] ipratropium-albuterol (DUONEB) 0.5-2.5 (3) MG/3ML SOLN Take 3 mLs by nebulization every 4 (four) hours as needed.    . Iron-FA-B Cmp-C-Biot-Probiotic (FUSION PLUS) CAPS Take 1 capsule by mouth daily.   [name] levothyroxine (SYNTHROID, LEVOTHROID) 175 MCG tablet Take 150 mcg by mouth daily before breakfast.    . losartan (COZAAR) 100 MG tablet Take 100 mg by mouth daily.   . magnesium oxide (MAG-OX) 400 MG tablet Take 400 mg by mouth daily.     . metFORMIN (GLUCOPHAGE-XR) 500 MG 24 hr tablet 1 tablet daily. 10/23/2013: Received from: External Pharmacy Received Sig:   . montelukast (SINGULAIR) 10 MG tablet  Take 1 tablet (10 mg total) by mouth at bedtime.   [name]. omeprazole (PRILOSEC) 20 MG capsule Take 1 capsule (20 mg total) by mouth daily.   . Oxycodone-Acetaminophen (PERCOCET PO) Take 5-325 mg by mouth every 8 (eight) hours as needed.    . pravastatin (PRAVACHOL) 40 MG tablet TAKE 1 TABLET ONCE DAILY.   [name]. predniSONE (DELTASONE) 20 MG tablet Take 1 tablet (20 mg total) by mouth daily.   . sertraline (ZOLOFT) 50 MG tablet Take 50 mg by mouth 2 (two) times daily. Reported on  09/07/2015   . Tiotropium Bromide-Olodaterol (STIOLTO RESPIMAT) 2.5-2.5 MCG/ACT AERS Inhale 2 puffs into the lungs daily.    No facility-administered encounter medications on file as of 12/08/2015.    Plan:  Patient will remain active with transition of care program and receive weekly outreaches, and already has planned home visit in the next 2 weeks will evaluate if patient needs home visit sooner. Patient will notify MD of worsening symptoms RN will notify [facility] regarding when patient first visit will be, and patient to contact RNCM if she has not heard from agency in the next day or equipment concerns.   [facility] LPH[facility] CM Care Plan Problem One        Most Recent Value   Care Plan Problem One  Recent hospital admission   Role Documenting the Problem One  Care Management Coordinator   Care Plan for Problem One  Active   THN Long Term Goal (31-90 days)  Patient will report no hospital readmission in the next 31 days   THN Long Term Goal Start Date  12/08/15   Interventions for Problem One Long Term Goal  Educated patient on transition of care, importance of adhering to discharge instructions, reviewed instructions    THN CM Short Term Goal #1 (0-30 days)  Patient will attend PCP and Pulmonary visit office visits in the next 14 days   THN CM Short Term Goal #1 Start Date  12/08/15   Interventions for Short Term Goal #1  verified patient has appointments and transportation, stressed importance on attending visits    THN CM Short Term Goal #2 (0-30 days)  Patient will begin home health services in the next 3 days    THN CM Short Term Goal #2 Start Date  12/08/15   Interventions for Short Term Goal #2  RN [facility], verify orders, explained to patient to expect a call in the next day   THN CM Short Term Goal #3 (0-30 days)  Patient will recieve Bipap equipment in the next 3 days   THN CM Short Term Goal #3 Start Date  12/08/15   Interventions for Short Tern Goal #3   Verified that [facility] has been in contact with patient.      Egbert GaribaldiKimberly Lexis Potenza, RN, [facility] Care Management [phone] Mobile [phone] Toll Free Main Office

## 2015-12-09 DIAGNOSIS — I5032 Chronic diastolic (congestive) heart failure: Secondary | ICD-10-CM | POA: Diagnosis not present

## 2015-12-09 DIAGNOSIS — Z79891 Long term (current) use of opiate analgesic: Secondary | ICD-10-CM | POA: Diagnosis not present

## 2015-12-09 DIAGNOSIS — Z87891 Personal history of nicotine dependence: Secondary | ICD-10-CM | POA: Diagnosis not present

## 2015-12-09 DIAGNOSIS — N183 Chronic kidney disease, stage 3 (moderate): Secondary | ICD-10-CM | POA: Diagnosis not present

## 2015-12-09 DIAGNOSIS — Z9981 Dependence on supplemental oxygen: Secondary | ICD-10-CM | POA: Diagnosis not present

## 2015-12-09 DIAGNOSIS — I130 Hypertensive heart and chronic kidney disease with heart failure and stage 1 through stage 4 chronic kidney disease, or unspecified chronic kidney disease: Secondary | ICD-10-CM | POA: Diagnosis not present

## 2015-12-09 DIAGNOSIS — J9621 Acute and chronic respiratory failure with hypoxia: Secondary | ICD-10-CM | POA: Diagnosis not present

## 2015-12-09 DIAGNOSIS — E1165 Type 2 diabetes mellitus with hyperglycemia: Secondary | ICD-10-CM | POA: Diagnosis not present

## 2015-12-09 DIAGNOSIS — E1122 Type 2 diabetes mellitus with diabetic chronic kidney disease: Secondary | ICD-10-CM | POA: Diagnosis not present

## 2015-12-09 DIAGNOSIS — Z7984 Long term (current) use of oral hypoglycemic drugs: Secondary | ICD-10-CM | POA: Diagnosis not present

## 2015-12-09 DIAGNOSIS — J441 Chronic obstructive pulmonary disease with (acute) exacerbation: Secondary | ICD-10-CM | POA: Diagnosis not present

## 2015-12-09 DIAGNOSIS — E1142 Type 2 diabetes mellitus with diabetic polyneuropathy: Secondary | ICD-10-CM | POA: Diagnosis not present

## 2015-12-09 DIAGNOSIS — J9622 Acute and chronic respiratory failure with hypercapnia: Secondary | ICD-10-CM | POA: Diagnosis not present

## 2015-12-09 DIAGNOSIS — F331 Major depressive disorder, recurrent, moderate: Secondary | ICD-10-CM | POA: Diagnosis not present

## 2015-12-09 DIAGNOSIS — Z932 Ileostomy status: Secondary | ICD-10-CM | POA: Diagnosis not present

## 2015-12-10 DIAGNOSIS — J9622 Acute and chronic respiratory failure with hypercapnia: Secondary | ICD-10-CM | POA: Diagnosis not present

## 2015-12-10 DIAGNOSIS — I130 Hypertensive heart and chronic kidney disease with heart failure and stage 1 through stage 4 chronic kidney disease, or unspecified chronic kidney disease: Secondary | ICD-10-CM | POA: Diagnosis not present

## 2015-12-10 DIAGNOSIS — J9621 Acute and chronic respiratory failure with hypoxia: Secondary | ICD-10-CM | POA: Diagnosis not present

## 2015-12-10 DIAGNOSIS — J441 Chronic obstructive pulmonary disease with (acute) exacerbation: Secondary | ICD-10-CM | POA: Diagnosis not present

## 2015-12-10 DIAGNOSIS — I5032 Chronic diastolic (congestive) heart failure: Secondary | ICD-10-CM | POA: Diagnosis not present

## 2015-12-10 DIAGNOSIS — E1122 Type 2 diabetes mellitus with diabetic chronic kidney disease: Secondary | ICD-10-CM | POA: Diagnosis not present

## 2015-12-11 DIAGNOSIS — J9602 Acute respiratory failure with hypercapnia: Secondary | ICD-10-CM | POA: Diagnosis not present

## 2015-12-14 ENCOUNTER — Ambulatory Visit: Payer: Self-pay | Admitting: *Deleted

## 2015-12-14 DIAGNOSIS — J9621 Acute and chronic respiratory failure with hypoxia: Secondary | ICD-10-CM | POA: Diagnosis not present

## 2015-12-14 DIAGNOSIS — E1122 Type 2 diabetes mellitus with diabetic chronic kidney disease: Secondary | ICD-10-CM | POA: Diagnosis not present

## 2015-12-14 DIAGNOSIS — I5032 Chronic diastolic (congestive) heart failure: Secondary | ICD-10-CM | POA: Diagnosis not present

## 2015-12-14 DIAGNOSIS — J441 Chronic obstructive pulmonary disease with (acute) exacerbation: Secondary | ICD-10-CM | POA: Diagnosis not present

## 2015-12-14 DIAGNOSIS — J9622 Acute and chronic respiratory failure with hypercapnia: Secondary | ICD-10-CM | POA: Diagnosis not present

## 2015-12-14 DIAGNOSIS — I130 Hypertensive heart and chronic kidney disease with heart failure and stage 1 through stage 4 chronic kidney disease, or unspecified chronic kidney disease: Secondary | ICD-10-CM | POA: Diagnosis not present

## 2015-12-15 ENCOUNTER — Other Ambulatory Visit: Payer: Self-pay | Admitting: *Deleted

## 2015-12-15 NOTE — Patient Outreach (Signed)
[facility]) Care Management  12/15/2015  Morgan Hays 1940-12-04 [id number]  Transition of care call  RNCM placed call to patient as part of the transition of care program. [name] reports that she is doing good, feeling so much better.  [name] report that she has received a "Trilogy , a step up from Bipap". Patient reports tolerating wearing mask well at night and wakes up feeling good. Patient discussed some concern regarding [facility] getting necessary insurance approval, but they are working on it.   [name] is followed by home health RN, and states she is able to completed her usual daily activities, physical therapy has completed services. [name] has seen PCP for visit, and has Pulmonary visit on 6/19.  Patient reports her breathing is in the green zone, she continues to wear her oxygen at 2 liters. Patient's appetite is good, today blood sugar is 109 and weight 184, without sudden increase of weight gain.  [name] denies any new concerns at this time.  Plan Patient will continue in transition of care program with initial transition home visit within the next week.  [facility] CM Care Plan Problem One        Most Recent Value   Care Plan Problem One  Recent hospital admission   Role Documenting the Problem One  Care Management [place] for Problem One  Active   THN Long Term Goal (31-90 days)  Patient will report no hospital readmission in the next 31 days   THN Long Term Goal Start Date  12/08/15   Interventions for Problem One Long Term Goal  Reinforced importance of notifying MD of new or worsening symptoms.   THN CM Short Term Goal #1 (0-30 days)  Patient will attend PCP and Pulmonary visit office visits in the next 14 days   THN CM Short Term Goal #1 Start Date  12/08/15   Interventions for Short Term Goal #1  Reviewed with patient her upcoming [place] appointment and verified that she would have transportation   THN  CM Short Term Goal #2 (0-30 days)  Patient will begin home health services in the next 3 days    THN CM Short Term Goal #2 Start Date  12/08/15   [facility] CM Short Term Goal #2 Met Date  12/15/15   THN CM Short Term Goal #3 (0-30 days)  Patient will recieve Bipap equipment in the next 3 days   THN CM Short Term Goal #3 Start Date  12/08/15   Interventions for Short Tern Goal #3  Verified that [facility] has been in contact with patient, encouraged patient to notify me if any concerns regarding delay in equipment.     Joylene Draft, RN, [place] Management [phone]- Mobile [phone]- Toll Free Main Office

## 2015-12-16 DIAGNOSIS — I5032 Chronic diastolic (congestive) heart failure: Secondary | ICD-10-CM | POA: Diagnosis not present

## 2015-12-16 DIAGNOSIS — J9621 Acute and chronic respiratory failure with hypoxia: Secondary | ICD-10-CM | POA: Diagnosis not present

## 2015-12-16 DIAGNOSIS — I130 Hypertensive heart and chronic kidney disease with heart failure and stage 1 through stage 4 chronic kidney disease, or unspecified chronic kidney disease: Secondary | ICD-10-CM | POA: Diagnosis not present

## 2015-12-16 DIAGNOSIS — J9622 Acute and chronic respiratory failure with hypercapnia: Secondary | ICD-10-CM | POA: Diagnosis not present

## 2015-12-16 DIAGNOSIS — E1122 Type 2 diabetes mellitus with diabetic chronic kidney disease: Secondary | ICD-10-CM | POA: Diagnosis not present

## 2015-12-16 DIAGNOSIS — J441 Chronic obstructive pulmonary disease with (acute) exacerbation: Secondary | ICD-10-CM | POA: Diagnosis not present

## 2015-12-21 ENCOUNTER — Ambulatory Visit (INDEPENDENT_AMBULATORY_CARE_PROVIDER_SITE_OTHER): Payer: Medicare Other | Admitting: Adult Health

## 2015-12-21 ENCOUNTER — Encounter: Payer: Self-pay | Admitting: Adult Health

## 2015-12-21 ENCOUNTER — Inpatient Hospital Stay: Payer: Medicare Other | Admitting: Adult Health

## 2015-12-21 VITALS — BP 134/68 | HR 109 | Temp 98.7°F | Ht 65.0 in | Wt 186.0 lb

## 2015-12-21 DIAGNOSIS — J9611 Chronic respiratory failure with hypoxia: Secondary | ICD-10-CM

## 2015-12-21 DIAGNOSIS — I5032 Chronic diastolic (congestive) heart failure: Secondary | ICD-10-CM | POA: Diagnosis not present

## 2015-12-21 DIAGNOSIS — J9622 Acute and chronic respiratory failure with hypercapnia: Secondary | ICD-10-CM | POA: Diagnosis not present

## 2015-12-21 DIAGNOSIS — I130 Hypertensive heart and chronic kidney disease with heart failure and stage 1 through stage 4 chronic kidney disease, or unspecified chronic kidney disease: Secondary | ICD-10-CM | POA: Diagnosis not present

## 2015-12-21 DIAGNOSIS — J441 Chronic obstructive pulmonary disease with (acute) exacerbation: Secondary | ICD-10-CM | POA: Diagnosis not present

## 2015-12-21 DIAGNOSIS — J9621 Acute and chronic respiratory failure with hypoxia: Secondary | ICD-10-CM | POA: Diagnosis not present

## 2015-12-21 DIAGNOSIS — E1122 Type 2 diabetes mellitus with diabetic chronic kidney disease: Secondary | ICD-10-CM | POA: Diagnosis not present

## 2015-12-21 NOTE — Patient Instructions (Signed)
Continue on Stiolto 2 puffs daily .  Continue on Oxygen 2l/m .  Continue on Trilogy At bedtime   Follow up with Dr. Delton CoombesByrum in 1 month as planned and As needed  Please contact office for sooner follow up if symptoms do not improve or worsen or seek emergency care

## 2015-12-21 NOTE — Assessment & Plan Note (Signed)
Recent exacerbation, now resolved. Cont on [employer]

## 2015-12-21 NOTE — Assessment & Plan Note (Addendum)
Continue on oxygen at 2 L Cont on Trilogy At bedtime

## 2015-12-21 NOTE — Progress Notes (Signed)
Morgan Hays is a 76 year old woman who follows up today for her COPD, ILD and Hypoxic Resp Failure , and  history of a RLL pulmonary nodule. 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     12/21/2015 Post hospital follow up : COPD /ILD /Hypoxic Resp Failure  Pt returns for a post hospital follow up.  Patient was recently admitted 12/01/2015 for COPD exacerbation with hypercarbic failure. She did require BiPAP support. She has had multiple hospitalizations from hypercarbic [place] failure. It has been recommended for her to begin a Trilogy noninvasive ventilator at bedtime. Social work was contacted to help with arrangement of trilogy machine. This was started when she got home. Wears for at least 8hr each night.  PCO2 was 81. 2-D echo showed an ejection action of 60-65%, impaired relaxation, mild mitral, tricuspid and aortic regurgitation. Feels much better. More alert in am and during daytime . Breathing does feel some better.  Gets winded easily .  On Stiolto now. Likes it best .  On 2l/m O2 now.   Denies chest pain, orthopnea, increased edema or fever. Says I need to call [place] at [facility]  , [phone] to discuss approval for Trilogy.  PVX and Prevnar utd.   Past Medical History  Diagnosis Date  . Supraventricular tachycardia (HCC)   . MVP (mitral valve prolapse)   . Hypothyroid   . Hypertension   . Aortic aneurysm (HCC)   . Pulmonary nodule   . Varicose vein   . Arthritis   . Depression   . COPD (chronic obstructive pulmonary disease) (HCC)   . CAD (coronary artery disease)   . Allergy   . Anemia   . Anxiety   . Blood transfusion without reported diagnosis   . Diabetes mellitus without complication (HCC)   . Cataract    Current Outpatient Prescriptions on File Prior to Visit  Medication Sig Dispense Refill  . albuterol (PROVENTIL HFA;VENTOLIN HFA) 108 (90 Base) MCG/ACT inhaler Inhale 1-2 puffs into the lungs every 6 (six) hours as needed for wheezing or shortness of  breath. 1 Inhaler 5  . amLODipine (NORVASC) 5 MG tablet Take 5 mg by mouth daily.    [name] aspirin 81 MG tablet Take 81 mg by mouth daily.      . butalbital-acetaminophen-caffeine (FIORICET WITH CODEINE) 50-325-40-30 MG per capsule Take 1 capsule by mouth every 6 (six) hours as needed.      . Calcium Carbonate (CALCIUM 500 PO) Take 1 capsule by mouth daily. Reported on 12/08/2015    . calcium carbonate (OS-CAL) 600 MG TABS tablet Take 600 mg by mouth daily with breakfast. Reported on 11/25/2015    . Cholecalciferol (VITAMIN D) 1000 UNITS capsule Take 1,000 Units by mouth daily.      . clonazePAM (KLONOPIN) 1 MG tablet Take 1 mg by mouth daily as needed for anxiety. Take one tablet daily as needed    . diphenhydrAMINE (SOMINEX) 25 MG tablet Take 25 mg by mouth at bedtime as needed for sleep. Reported on 11/25/2015    . fexofenadine (ALLEGRA) 180 MG tablet Take 180 mg by mouth daily.    . fluticasone (FLONASE) 50 MCG/ACT nasal spray Place 2 sprays into both nostrils daily. 16 g 5  . furosemide (LASIX) 20 MG tablet Take 40 mg by mouth. Take one tablet once daily as needed for swelling    . gabapentin (NEURONTIN) 100 MG capsule Take 300 mg by mouth 2 (two) times daily.     [name] ipratropium-albuterol (  DUONEB) 0.5-2.5 (3) MG/3ML SOLN Take 3 mLs by nebulization every 4 (four) hours as needed.     . Iron-FA-B Cmp-C-Biot-Probiotic (FUSION PLUS) CAPS Take 1 capsule by mouth daily.    [name]. levothyroxine (SYNTHROID, LEVOTHROID) 175 MCG tablet Take 150 mcg by mouth daily before breakfast.     . losartan (COZAAR) 100 MG tablet Take 100 mg by mouth daily.    . magnesium oxide (MAG-OX) 400 MG tablet Take 400 mg by mouth daily.      . metFORMIN (GLUCOPHAGE-XR) 500 MG 24 hr tablet 1 tablet daily.    . montelukast (SINGULAIR) 10 MG tablet Take 1 tablet (10 mg total) by mouth at bedtime. 30 tablet 5  . omeprazole (PRILOSEC) 20 MG capsule Take 1 capsule (20 mg total) by mouth daily. 30 capsule 6  . Oxycodone-Acetaminophen  (PERCOCET PO) Take 5-325 mg by mouth every 8 (eight) hours as needed.     . pravastatin (PRAVACHOL) 40 MG tablet TAKE 1 TABLET ONCE DAILY. 30 tablet 11  . predniSONE (DELTASONE) 20 MG tablet Take 1 tablet (20 mg total) by mouth daily. (Patient taking differently: Take 20 mg by mouth daily. Taking 2 tablets x4 days then back to 1 daily) 30 tablet 5  . sertraline (ZOLOFT) 50 MG tablet Take 50 mg by mouth 2 (two) times daily. Reported on 09/07/2015    . Tiotropium Bromide-Olodaterol (STIOLTO RESPIMAT) 2.5-2.5 MCG/ACT AERS Inhale 2 puffs into the lungs daily. (Patient not taking: Reported on 12/08/2015) 4 g 6   No current facility-administered medications on file prior to visit.     ROS Constitutional:   No  weight loss, night sweats,  Fevers, chills,  +fatigue, or  lassitude.  HEENT:   No headaches,  Difficulty swallowing,  Tooth/dental problems, or  Sore throat,                No sneezing, itching, ear ache,  +nasal congestion, post nasal drip,   CV:  No chest pain,  Orthopnea, PND, anasarca, dizziness, palpitations, syncope.   GI  No heartburn, indigestion, abdominal pain, nausea, vomiting, diarrhea, change in bowel habits, loss of appetite, bloody stools.   Resp:    No chest wall deformity  Skin: no rash or lesions.  GU: no dysuria, change in color of urine, no urgency or frequency.  No flank pain, no hematuria   MS:  No joint pain or swelling.  No decreased range of motion.  No back pain.  Psych:  No change in mood or affect. No depression or anxiety.  No memory loss.      EXAM Filed Vitals:   12/21/15 1532  BP: 134/68  Pulse: 109  Temp: 98.7 F (37.1 C)  TempSrc: Oral  Height: 5\' 5"  (1.651 m)  Weight: 186 lb (84.369 kg)  SpO2: 95%   Body mass index is 30.95 kg/(m^2).   Gen: Pleasant, obese, in no distress, elderly and chronically ill appearing in wc   ENT: No lesions,  mouth clear,  oropharynx clear, no postnasal drip  Neck: No JVD, no TMG, no carotid  bruits  Lungs: Distant  BS w/ no wheezing, very distant  Cardiovascular: RRR, heart sounds normal, no murmur or gallops, trace-1+ edema  Musculoskeletal: No deformities, no cyanosis or clubbing  Neuro: alert, non focal  Skin: Warm, no lesions or rashes , thin and ecchymotic areas on forearms    12/19/11 Myoview:  Overall Impression: Normal stress nuclear study with a small, mild, fixed apical defect consistent with thinning; no  ischemia.  LV Ejection Fraction: 79%. LV Wall Motion: NL LV Function; NL Wall Motion  CT chest 11/06/12 >>  No PE, centra-lobular emphysema, no infiltrates or nodules, stable thoracic aortic aneurysm  CT chest 08/28/15 > Slow progressive enlargement of the aneurysmal segments of the thoracic aorta, as detailed above. This is most significant in the ascending thoracic aorta where the maximal diameter is currently 5.4 mm (previously 5.3 mm). The greatest increase in diameter has occurred in the isthmus, which currently measures 5.2 cm in diameter (previously 4.9 cm in diameter). 2. There are calcifications of the aortic valve. Echocardiographic correlation for evaluation of potential valvular dysfunction may be warranted if clinically indicated. 3. Left main and 3 vessel coronary artery disease again noted. 4. Mild diffuse bronchial thickening with moderate centrilobular and mild paraseptal emphysema; imaging findings suggestive of underlying COPD. 5. Additional incidental findings, as above. Morgan Schuchart NP-C  [place]  12/21/2015

## 2015-12-22 ENCOUNTER — Other Ambulatory Visit: Payer: Self-pay | Admitting: *Deleted

## 2015-12-22 ENCOUNTER — Encounter: Payer: Self-pay | Admitting: *Deleted

## 2015-12-22 ENCOUNTER — Telehealth: Payer: Self-pay | Admitting: Adult Health

## 2015-12-22 NOTE — Telephone Encounter (Signed)
Pt was seen on 12/21/15 with TP Per verbal order from TP  Call LambogliaKelly with [facility] at [phone]   I called and spoke with kelly. She states that [facility] a [facility]. She states that she needs forms signed and a the pt's ov note to state specific information in order for insurance to accept the claim. She states she would fax the information to our office at [phone] to the attention of Marchelle Folksmanda. Will await forms.

## 2015-12-22 NOTE — Patient Outreach (Signed)
[facility]) Care Management   12/22/2015  Morgan Hays [birth year] [id number]  Morgan Hays is an 75 y.o. female  Subjective:  Report feeling pretty good, just tires easily. Morgan Hays reports that she wakes up feeling good after sleeping with trilogy all night, " I can't believe how much better I feel in the morning".  Morgan Hays voiced concern regarding insurance coverage for trilogy states [facility] patient is still working on it and she has recently given , pulmonary office contact information for person  a [facility] patient working on case. Morgan Hays report agency has updated her each week.   Objective:  BP 118/60 mmHg  Pulse 92  Resp 20  Wt 184 lb (83.462 kg)  SpO2 98%  Patient resting in her electric scooter.  Review of Systems  Constitutional: Negative.   HENT: Negative.   Eyes: Negative.   Respiratory: Positive for shortness of breath. Negative for sputum production.        Shortness of breath with activity  Cardiovascular: Positive for leg swelling. Negative for chest pain.       Bilateral lower extremities,especially ankles   Gastrointestinal: Negative for nausea.       Ileostomy  Genitourinary: Negative.   Musculoskeletal: Positive for back pain.  Skin: Negative.   Neurological: Negative.   Psychiatric/Behavioral: Negative.     Physical Exam  Constitutional: She is oriented to person, place, and time. She appears well-developed and well-nourished.  Cardiovascular: Normal rate, regular rhythm, normal heart sounds and intact distal pulses.   Respiratory: Effort normal. She has no wheezes.  GI: Soft.  Neurological: She is alert and oriented to person, place, and time.  Skin: Skin is warm and dry.  Psychiatric: She has a normal mood and affect. Her behavior is normal. Judgment and thought content normal.    Encounter Medications:   Outpatient Encounter Prescriptions as of 12/22/2015  Medication Sig Note  . albuterol (PROVENTIL  HFA;VENTOLIN HFA) 108 (90 Base) MCG/ACT inhaler Inhale 1-2 puffs into the lungs every 6 (six) hours as needed for wheezing or shortness of breath.   [name] amLODipine (NORVASC) 5 MG tablet Take 5 mg by mouth daily.   [name] aspirin 81 MG tablet Take 81 mg by mouth daily.     . butalbital-acetaminophen-caffeine (FIORICET WITH CODEINE) 50-325-40-30 MG per capsule Take 1 capsule by mouth every 6 (six) hours as needed.     . calcium carbonate (OS-CAL) 600 MG TABS tablet Take 600 mg by mouth daily with breakfast. Reported on 11/25/2015   . Cholecalciferol (VITAMIN D) 1000 UNITS capsule Take 1,000 Units by mouth daily.     . clonazePAM (KLONOPIN) 1 MG tablet Take 1 mg by mouth daily as needed for anxiety. Take one tablet daily as needed   . diphenhydrAMINE (SOMINEX) 25 MG tablet Take 25 mg by mouth at bedtime as needed for sleep. Reported on 11/25/2015   . fexofenadine (ALLEGRA) 180 MG tablet Take 180 mg by mouth daily.   . fluticasone (FLONASE) 50 MCG/ACT nasal spray Place 2 sprays into both nostrils daily.   . furosemide (LASIX) 20 MG tablet Take 40 mg by mouth. Take one tablet once daily as needed for swelling   . gabapentin (NEURONTIN) 100 MG capsule Take 300 mg by mouth 2 (two) times daily.    [name] ipratropium-albuterol (DUONEB) 0.5-2.5 (3) MG/3ML SOLN Take 3 mLs by nebulization every 4 (four) hours as needed.    . Iron-FA-B Cmp-C-Biot-Probiotic (FUSION PLUS) CAPS Take 1 capsule by mouth daily.   [name]  levothyroxine (SYNTHROID, LEVOTHROID) 175 MCG tablet Take 150 mcg by mouth daily before breakfast.    . losartan (COZAAR) 100 MG tablet Take 100 mg by mouth daily.   . magnesium oxide (MAG-OX) 400 MG tablet Take 400 mg by mouth daily.     . metFORMIN (GLUCOPHAGE-XR) 500 MG 24 hr tablet 1 tablet daily. 10/23/2013: Received from: External Pharmacy Received Sig:   . montelukast (SINGULAIR) 10 MG tablet Take 1 tablet (10 mg total) by mouth at bedtime.   [name] omeprazole (PRILOSEC) 20 MG capsule Take 1 capsule (20 mg total) by  mouth daily.   . Oxycodone-Acetaminophen (PERCOCET PO) Take 5-325 mg by mouth every 8 (eight) hours as needed.    . pravastatin (PRAVACHOL) 40 MG tablet TAKE 1 TABLET ONCE DAILY.   [name] predniSONE (DELTASONE) 20 MG tablet Take 1 tablet (20 mg total) by mouth daily. (Patient taking differently: Take 20 mg by mouth daily. Taking 2 tablets x4 days then back to 1 daily)   . sertraline (ZOLOFT) 50 MG tablet Take 50 mg by mouth 2 (two) times daily. Reported on 09/07/2015   . Tiotropium Bromide-Olodaterol (STIOLTO RESPIMAT) 2.5-2.5 MCG/ACT AERS Inhale 2 puffs into the lungs daily.   . Calcium Carbonate (CALCIUM 500 PO) Take 1 capsule by mouth daily. Reported on 12/22/2015    No facility-administered encounter medications on file as of 12/22/2015.    Functional Status:   In your present state of health, do you have any difficulty performing the following activities: 12/22/2015 11/25/2015  Hearing? N N  Vision? N N  Difficulty concentrating or making decisions? Y N  Walking or climbing stairs? N Y  Dressing or bathing? N N  Doing errands, shopping? [name]  Preparing Food and eating ? Y Y  Using the Toilet? N N  In the past six months, have you accidently leaked urine? Y N  Do you have problems with loss of bowel control? Y N  Managing your Medications? N N  Managing your Finances? N N  Housekeeping or managing your Housekeeping? [name]    Fall/Depression Screening:    PHQ 2/9 Scores 12/22/2015 11/25/2015 09/29/2015 07/24/2015 06/22/2015 03/23/2015 12/31/2014  PHQ - 2 Score 1 0 '1 2 2 1 1  ' PHQ- 9 Score - - - '8 5 3 3    ' Assessment:  Initial home visit Morgan Hays is still being followed by [place].  COPD  - patient feeling better, in green zone,  but still gets winded with activity, will benefit from continued education and management of COPD, reinforced  Zones. Morgan Hays states she is still getting Stiolto samples from PCP office , offered to call her pharmacy to check on her co pay for  medication she declined.  History of Falls - No recent falls, will benefit from exercises to help increase balance.  Lower Leg Edema - Patient taking medication as prescribed, discussed importance of watching salt in diet and elevating legs during the day., patient weighs weekly , no increase in weights.  Plan:  Patient will remain active with transition of care program, and weekly outreaches, scheduled call within one week. Patient will notify MD of new or worsening symptoms RN provided and reviewed EMMI education on COPD.   [facility] CM Care Plan Problem One        Most Recent Value   Care Plan Problem One  Recent hospital admission   Role Documenting the Problem One  Care Management [place] for Problem One  Active   THN Long Term Goal (31-90 days)  Patient will report no hospital readmission in the next 31 days   THN Long Term Goal Start Date  12/08/15   Interventions for Problem One Long Term Goal  Encouraged patient in the importance of notifying MD, with new symptoms or worsening of symptoms.    THN CM Short Term Goal #1 (0-30 days)  Patient will attend PCP and Pulmonary visit office visits in the next 14 days   THN CM Short Term Goal #1 Start Date  12/08/15   [facility] CM Short Term Goal #1 Met Date  12/22/15   THN CM Short Term Goal #2 (0-30 days)  Patient will begin home health services in the next 3 days    THN CM Short Term Goal #2 Start Date  12/08/15   [facility] CM Short Term Goal #2 Met Date  12/15/15   THN CM Short Term Goal #3 (0-30 days)  Patient will recieve Bipap equipment in the next 3 days   THN CM Short Term Goal #3 Start Date  12/08/15   [facility] CM Short Term Goal #3 Met Date  12/22/15   THN CM Short Term Goal #4 (0-30 days)  Patient will be to recognize zone of copd that she is in and state action plan in the next 30 days   THN CM Short Term Goal #4 Start Date  12/22/15   Interventions for Short Term Goal #4  Reviewed EMMI handout on COPD disease and reviewed all  zones.    [facility] CM Care Plan Problem Two        Most Recent Value   Care Plan Problem Two  Activity intolerance, fall risk   Role Documenting the Problem Two  Care Management Coordinator   Care Plan for Problem Two  Active   Interventions for Problem Two Long Term Goal   Discussed with patient to spread activities out, pace herself, rest in between   [place] Goal (31-90) days  Patient will report increased knowledge of fall prevention strategies in the next 31 days   THN Long Term Goal Start Date  12/22/15   THN CM Short Term Goal #1 (0-30 days)  Patient will report increase in completing sitting exercises daily in the next 30 days   THN CM Short Term Goal #1 Start Date  12/22/15   Interventions for Short Term Goal #2   Discussed importance of exercise on maintaining strenght and balance, reviewed exercise in previous information pack from home health PT   THN CM Short Term Goal #2 (0-30 days)  Patient will report no falls in the next 20 days   THN CM Short Term Goal #2 Start Date  12/22/15   Interventions for Short Term Goal #2  Educated on the importance of using her rolling walker at all times     Joylene Draft, RN, [place] Management [phone]- Mobile [phone]- [place]

## 2015-12-24 DIAGNOSIS — J441 Chronic obstructive pulmonary disease with (acute) exacerbation: Secondary | ICD-10-CM | POA: Diagnosis not present

## 2015-12-24 DIAGNOSIS — J9621 Acute and chronic respiratory failure with hypoxia: Secondary | ICD-10-CM | POA: Diagnosis not present

## 2015-12-24 DIAGNOSIS — J9622 Acute and chronic respiratory failure with hypercapnia: Secondary | ICD-10-CM | POA: Diagnosis not present

## 2015-12-24 DIAGNOSIS — I5032 Chronic diastolic (congestive) heart failure: Secondary | ICD-10-CM | POA: Diagnosis not present

## 2015-12-24 DIAGNOSIS — I130 Hypertensive heart and chronic kidney disease with heart failure and stage 1 through stage 4 chronic kidney disease, or unspecified chronic kidney disease: Secondary | ICD-10-CM | POA: Diagnosis not present

## 2015-12-24 DIAGNOSIS — E1122 Type 2 diabetes mellitus with diabetic chronic kidney disease: Secondary | ICD-10-CM | POA: Diagnosis not present

## 2015-12-28 DIAGNOSIS — I5032 Chronic diastolic (congestive) heart failure: Secondary | ICD-10-CM | POA: Diagnosis not present

## 2015-12-28 DIAGNOSIS — I130 Hypertensive heart and chronic kidney disease with heart failure and stage 1 through stage 4 chronic kidney disease, or unspecified chronic kidney disease: Secondary | ICD-10-CM | POA: Diagnosis not present

## 2015-12-28 DIAGNOSIS — J441 Chronic obstructive pulmonary disease with (acute) exacerbation: Secondary | ICD-10-CM | POA: Diagnosis not present

## 2015-12-28 DIAGNOSIS — J9621 Acute and chronic respiratory failure with hypoxia: Secondary | ICD-10-CM | POA: Diagnosis not present

## 2015-12-28 DIAGNOSIS — J9622 Acute and chronic respiratory failure with hypercapnia: Secondary | ICD-10-CM | POA: Diagnosis not present

## 2015-12-28 DIAGNOSIS — E1122 Type 2 diabetes mellitus with diabetic chronic kidney disease: Secondary | ICD-10-CM | POA: Diagnosis not present

## 2015-12-29 ENCOUNTER — Other Ambulatory Visit: Payer: Self-pay | Admitting: *Deleted

## 2015-12-29 NOTE — Patient Outreach (Signed)
[facility]) Care Management  12/29/2015  Morgan Hays 1941/05/10 [id number]  Transition of care call  [name] reports that she is doing well on today, states she didn't sleep well on last night no particular reason. Patient states her breathing is in the green zone on today, denies any new concerns. [name] was happy to report that she has been approved with her Trilogy, she continues to use it nightly tolerating well .Patient continues to report having a good appetite, weights staying in the 183 to 185 range, most recent blood sugar is 112.  Morgan Hays is still followed by home health RN and next week will be her last visit.  [name] is progressing well , and agreeable to being followed by health coach at end of transition of care period if no further care management needs to be addressed.  Plan Final transition of care call on next week, with plan to transition to health coach for continued education and management of COPD. Patient will notify MD of new or worsening COPD symptoms.  [facility] CM Care Plan Problem One        Most Recent Value   Care Plan Problem One  Recent hospital admission   Role Documenting the Problem One  Care Management [place] for Problem One  Active   THN Long Term Goal (31-90 days)  Patient will report no hospital readmission in the next 31 days   THN Long Term Goal Start Date  12/08/15   Interventions for Problem One Long Term Goal  Encouraged patient in the importance of notifying MD, with new symptoms or worsening of symptoms.    THN CM Short Term Goal #1 (0-30 days)  Patient will attend PCP and Pulmonary visit office visits in the next 14 days   THN CM Short Term Goal #1 Start Date  12/08/15   [facility] CM Short Term Goal #1 Met Date  12/22/15   THN CM Short Term Goal #2 (0-30 days)  Patient will begin home health services in the next 3 days    THN CM Short Term Goal #2 Start Date  12/08/15   [facility] CM Short Term Goal #2 Met  Date  12/15/15   THN CM Short Term Goal #3 (0-30 days)  Patient will recieve Bipap equipment in the next 3 days   THN CM Short Term Goal #3 Start Date  12/08/15   [facility] CM Short Term Goal #3 Met Date  12/22/15   THN CM Short Term Goal #4 (0-30 days)  Patient will be to recognize zone of copd that she is in and state action plan in the next 30 days   THN CM Short Term Goal #4 Start Date  12/22/15   [facility] CM Short Term Goal #4 Met Date  12/29/15    [facility] CM Care Plan Problem Two        Most Recent Value   Care Plan Problem Two  Activity intolerance, fall risk   Role Documenting the Problem Two  Care Management Coordinator   Care Plan for Problem Two  Active   Interventions for Problem Two Long Term Goal   Reinforced fall prevention strategies for fall prevention   THN Long Term Goal (31-90) days  Patient will report increased knowledge of fall prevention strategies in the next 31 days   THN Long Term Goal Start Date  12/22/15   THN CM Short Term Goal #1 (0-30 days)  Patient will report increase in completing sitting exercises  daily in the next 30 days   THN CM Short Term Goal #1 Start Date  12/22/15   Interventions for Short Term Goal #2   Patient reports working one exercises , reinforced   THN CM Short Term Goal #2 (0-30 days)  Patient will report no falls in the next 20 days   THN CM Short Term Goal #2 Start Date  12/22/15   Interventions for Short Term Goal #2  Reinforced importance of using walker at all times     Joylene Draft, RN, [place] Management [phone] Mobile [phone]- [place]

## 2015-12-31 ENCOUNTER — Ambulatory Visit: Payer: Medicare Other | Admitting: *Deleted

## 2015-12-31 DIAGNOSIS — I5032 Chronic diastolic (congestive) heart failure: Secondary | ICD-10-CM | POA: Diagnosis not present

## 2015-12-31 DIAGNOSIS — I130 Hypertensive heart and chronic kidney disease with heart failure and stage 1 through stage 4 chronic kidney disease, or unspecified chronic kidney disease: Secondary | ICD-10-CM | POA: Diagnosis not present

## 2015-12-31 DIAGNOSIS — E1122 Type 2 diabetes mellitus with diabetic chronic kidney disease: Secondary | ICD-10-CM | POA: Diagnosis not present

## 2015-12-31 DIAGNOSIS — J9621 Acute and chronic respiratory failure with hypoxia: Secondary | ICD-10-CM | POA: Diagnosis not present

## 2015-12-31 DIAGNOSIS — J441 Chronic obstructive pulmonary disease with (acute) exacerbation: Secondary | ICD-10-CM | POA: Diagnosis not present

## 2015-12-31 DIAGNOSIS — J9622 Acute and chronic respiratory failure with hypercapnia: Secondary | ICD-10-CM | POA: Diagnosis not present

## 2016-01-04 ENCOUNTER — Telehealth: Payer: Self-pay | Admitting: Adult Health

## 2016-01-04 NOTE — Telephone Encounter (Signed)
Received progress notes from pt's hospital stay [facility]. I never received any form for TP to sign and need clarification.  LVM for Tresa EndoKelly with [facility] [phone]) to return my call.

## 2016-01-06 DIAGNOSIS — J9622 Acute and chronic respiratory failure with hypercapnia: Secondary | ICD-10-CM | POA: Diagnosis not present

## 2016-01-06 DIAGNOSIS — J441 Chronic obstructive pulmonary disease with (acute) exacerbation: Secondary | ICD-10-CM | POA: Diagnosis not present

## 2016-01-06 DIAGNOSIS — I130 Hypertensive heart and chronic kidney disease with heart failure and stage 1 through stage 4 chronic kidney disease, or unspecified chronic kidney disease: Secondary | ICD-10-CM | POA: Diagnosis not present

## 2016-01-06 DIAGNOSIS — E1122 Type 2 diabetes mellitus with diabetic chronic kidney disease: Secondary | ICD-10-CM | POA: Diagnosis not present

## 2016-01-06 DIAGNOSIS — J9621 Acute and chronic respiratory failure with hypoxia: Secondary | ICD-10-CM | POA: Diagnosis not present

## 2016-01-06 DIAGNOSIS — I5032 Chronic diastolic (congestive) heart failure: Secondary | ICD-10-CM | POA: Diagnosis not present

## 2016-01-07 ENCOUNTER — Encounter: Payer: Self-pay | Admitting: *Deleted

## 2016-01-07 ENCOUNTER — Other Ambulatory Visit: Payer: Self-pay | Admitting: *Deleted

## 2016-01-07 DIAGNOSIS — Z932 Ileostomy status: Secondary | ICD-10-CM

## 2016-01-07 DIAGNOSIS — J441 Chronic obstructive pulmonary disease with (acute) exacerbation: Secondary | ICD-10-CM

## 2016-01-07 DIAGNOSIS — E118 Type 2 diabetes mellitus with unspecified complications: Secondary | ICD-10-CM

## 2016-01-07 DIAGNOSIS — I10 Essential (primary) hypertension: Secondary | ICD-10-CM

## 2016-01-07 NOTE — Patient Outreach (Signed)
[facility]) Care Management  01/07/2016  Docia Klar July 19, 1940 [id number]   Transition of care  Weekly transition of care call placed to patient. She states she is doing well, home health RN completed last visit on today. Mrs.Haddox reports she continues to tolerate sleeping with the trilogy on at night and without any problems. Patient reports her breathing is in the green zone on today.  Patient reports the swelling in her lower legs has gone down, her weights are ranging 181 to 188. Patient report her blood sugar is 108 on today.  Mrs.Rinks reports she continues to take her medications as prescribed and she has all of her medication and denies concern regarding purchasing medications. Mrs.Geving has consistent MD follow up, she voiced understanding the importance of notifying MD of symptoms in yellow zone to notify MD of sooner and able to list at least 3 symptoms. Mrs.Riggins denies any new concern for care management, care management goals met.  Mrs.Molnar is aware that the transition of care program has completed, and she is in agreement for continued education management and support  by telephone with RN health coach. Mrs.Archbold expressed thanks for the support she has received from care Freight forwarder. Encouraged patient to notify RN care manager or [facility] main number if needs arise prior to Telephonic health coach contact.  Plan Will refer to telephonic health coach.   Joylene Draft, RN, [place] Management [phone] Mobile [phone]- Toll Free Main Office

## 2016-01-07 NOTE — Progress Notes (Signed)
HPI: FU thoracic aneurysm, severe COPD, CAD and paroxysmal atrial tachycardia. She had an echocardiogram in Nov 2010, that showed normal LV function, grade 1 diastolic dysfunction, aortic aneurysm, mild AI and mild to moderate MR. Last Myoview was performed in June of 2013. Her ejection fraction was 79% with apical thinning and no ischemia. Chest CT February 2017 showed thoracic aortic aneurysm measuring 5.4 cm. There is note of coronary calcification and COPD. She has elected conservative therapy. She is followed by Dr. Tyrone SageGerhardt for her aneurysm. Since last seen, She has severe dyspnea on exertion. No orthopnea, PND, chest pain or syncope. She has not had any bouts of SVT. Occasional mild pedal edema.  Current Outpatient Prescriptions  Medication Sig Dispense Refill  . albuterol (PROVENTIL HFA;VENTOLIN HFA) 108 (90 Base) MCG/ACT inhaler Inhale 1-2 puffs into the lungs every 6 (six) hours as needed for wheezing or shortness of breath. 1 Inhaler 5  . amLODipine (NORVASC) 5 MG tablet Take 5 mg by mouth daily.    [name]. aspirin 81 MG tablet Take 81 mg by mouth daily.      . butalbital-acetaminophen-caffeine (FIORICET WITH CODEINE) 50-325-40-30 MG per capsule Take 1 capsule by mouth every 6 (six) hours as needed.      . Calcium Carbonate (CALCIUM 500 PO) Take 1 capsule by mouth daily. Reported on 12/22/2015    . calcium carbonate (OS-CAL) 600 MG TABS tablet Take 600 mg by mouth daily with breakfast. Reported on 11/25/2015    . Cholecalciferol (VITAMIN D) 1000 UNITS capsule Take 1,000 Units by mouth daily.      . clonazePAM (KLONOPIN) 1 MG tablet Take 1 mg by mouth daily as needed for anxiety. Take one tablet daily as needed    . diphenhydrAMINE (SOMINEX) 25 MG tablet Take 25 mg by mouth at bedtime as needed for sleep. Reported on 11/25/2015    . fexofenadine (ALLEGRA) 180 MG tablet Take 180 mg by mouth daily.    . fluticasone (FLONASE) 50 MCG/ACT nasal spray Place 2 sprays into both nostrils daily. 16 g  5  . furosemide (LASIX) 20 MG tablet Take 40 mg by mouth. Take one tablet once daily as needed for swelling    . gabapentin (NEURONTIN) 100 MG capsule Take 300 mg by mouth 2 (two) times daily.     [name]. ipratropium-albuterol (DUONEB) 0.5-2.5 (3) MG/3ML SOLN Take 3 mLs by nebulization every 4 (four) hours as needed.     . Iron-FA-B Cmp-C-Biot-Probiotic (FUSION PLUS) CAPS Take 1 capsule by mouth daily.    [name]. levothyroxine (SYNTHROID, LEVOTHROID) 175 MCG tablet Take 150 mcg by mouth daily before breakfast.     . losartan (COZAAR) 100 MG tablet Take 100 mg by mouth daily.    . magnesium oxide (MAG-OX) 400 MG tablet Take 400 mg by mouth daily.      . metFORMIN (GLUCOPHAGE-XR) 500 MG 24 hr tablet 1 tablet daily.    . montelukast (SINGULAIR) 10 MG tablet Take 1 tablet (10 mg total) by mouth at bedtime. 30 tablet 5  . omeprazole (PRILOSEC) 20 MG capsule Take 1 capsule (20 mg total) by mouth daily. 30 capsule 6  . Oxycodone-Acetaminophen (PERCOCET PO) Take 5-325 mg by mouth every 8 (eight) hours as needed.     . pravastatin (PRAVACHOL) 40 MG tablet TAKE 1 TABLET ONCE DAILY. 30 tablet 11  . predniSONE (DELTASONE) 20 MG tablet Take 1 tablet (20 mg total) by mouth daily. (Patient taking differently: Take 20 mg by mouth daily.  Taking 2 tablets x4 days then back to 1 daily) 30 tablet 5  . sertraline (ZOLOFT) 50 MG tablet Take 50 mg by mouth 2 (two) times daily. Reported on 09/07/2015    . Tiotropium Bromide-Olodaterol (STIOLTO RESPIMAT) 2.5-2.5 MCG/ACT AERS Inhale 2 puffs into the lungs daily. 4 g 6   No current facility-administered medications for this visit.     Past Medical History  Diagnosis Date  . Supraventricular tachycardia (HCC)   . MVP (mitral valve prolapse)   . Hypothyroid   . Hypertension   . Aortic aneurysm (HCC)   . Pulmonary nodule   . Varicose vein   . Arthritis   . Depression   . COPD (chronic obstructive pulmonary disease) (HCC)   . CAD (coronary artery disease)   . Allergy   .  Anemia   . Anxiety   . Blood transfusion without reported diagnosis   . Diabetes mellitus without complication (HCC)   . Cataract     Past Surgical History  Procedure Laterality Date  . Appendectomy  1956  . Total abdominal hysterectomy  1975  . Tubal ligation  1972  . Breast surgery      benign left breast cyst removed    Social History   Social History  . Marital Status: Married    Spouse Name: N/A  . Number of Children: N/A  . Years of Education: N/A   Occupational History  . LPN    Social History Main Topics  . Smoking status: Former Smoker -- 1.00 packs/day for 50 years    Types: Cigarettes    Quit date: 07/05/1999  . Smokeless tobacco: Never Used  . Alcohol Use: No  . Drug Use: No  . Sexual Activity: No   Other Topics Concern  . Not on file   Social History Narrative   Pt is married and lives with her husband. She works as an Public house managerLPN.  She has not had any known TB exposures.  She is a former smoker.  Quit in 2001.  She has approx 50 pack year total history.  Does not use alcohol     Family History  Problem Relation Age of Onset  . Heart disease Father   . Heart disease Son   . Heart disease Mother   . Heart disease Other     grandfather     ROS: no fevers or chills, productive cough, hemoptysis, dysphasia, odynophagia, melena, hematochezia, dysuria, hematuria, rash, seizure activity, orthopnea, PND, claudication. Remaining systems are negative.  Physical Exam: Well-developed well-nourished in no acute distress.  Skin is warm and dry.  HEENT is normal.  Neck is supple.  Chest with diminished BS throughout Cardiovascular exam is regular rate and rhythm.  Abdominal exam nontender or distended. No masses palpated. Extremities show no edema. neuro grossly intact  ECG Sinus tachycardia at a rate of 104. RV conduction delay. Cannot rule out prior anterior infarct. Low voltage.   Assessment and plan  1 hyperlipidemia-continue statin. 2  hypertension-blood pressure controlled. Continue present medications. 3 coronary artery disease-continue aspirin and statin. 4 thoracic aortic aneurysm-she has significant dilatation of her thoracic aorta. However she is not a good candidate for aortic root replacement given severity of COPD. She has therefore elected not to pursue follow-up CT scans. Continue aggressive blood pressure control. 5 supraventricular tachycardia-no recent episodes.  Olga MillersBrian Crenshaw, MD

## 2016-01-11 DIAGNOSIS — J9622 Acute and chronic respiratory failure with hypercapnia: Secondary | ICD-10-CM | POA: Diagnosis not present

## 2016-01-11 DIAGNOSIS — J441 Chronic obstructive pulmonary disease with (acute) exacerbation: Secondary | ICD-10-CM | POA: Diagnosis not present

## 2016-01-11 DIAGNOSIS — J9621 Acute and chronic respiratory failure with hypoxia: Secondary | ICD-10-CM | POA: Diagnosis not present

## 2016-01-12 ENCOUNTER — Ambulatory Visit (INDEPENDENT_AMBULATORY_CARE_PROVIDER_SITE_OTHER): Payer: Medicare Other | Admitting: Cardiology

## 2016-01-12 ENCOUNTER — Encounter: Payer: Self-pay | Admitting: Cardiology

## 2016-01-12 VITALS — BP 132/60 | HR 104 | Ht 65.0 in | Wt 189.0 lb

## 2016-01-12 DIAGNOSIS — E785 Hyperlipidemia, unspecified: Secondary | ICD-10-CM | POA: Diagnosis not present

## 2016-01-12 DIAGNOSIS — I712 Thoracic aortic aneurysm, without rupture, unspecified: Secondary | ICD-10-CM

## 2016-01-12 DIAGNOSIS — I2510 Atherosclerotic heart disease of native coronary artery without angina pectoris: Secondary | ICD-10-CM | POA: Diagnosis not present

## 2016-01-12 DIAGNOSIS — I10 Essential (primary) hypertension: Secondary | ICD-10-CM | POA: Diagnosis not present

## 2016-01-12 NOTE — Patient Instructions (Signed)
Your physician wants you to follow-up in: 1 year with Dr. Crenshaw. You will receive a reminder letter in the mail two months in advance. If you don't receive a letter, please call our office to schedule the follow-up appointment.   If you need a refill on your cardiac medications before your next appointment, please call your pharmacy.   

## 2016-01-14 NOTE — Telephone Encounter (Signed)
Called # provided below for Morgan Hays--[id number]

## 2016-01-14 NOTE — Telephone Encounter (Signed)
Spoke with Tresa EndoKelly and she states that forms have been signed and received. She does not think they need anything else but she will double check and call us if they do. Nothing further needed at this time.

## 2016-01-14 NOTE — Telephone Encounter (Signed)
Tresa EndoKelly returned call, CB is [phone]

## 2016-01-15 DIAGNOSIS — I5032 Chronic diastolic (congestive) heart failure: Secondary | ICD-10-CM | POA: Diagnosis not present

## 2016-01-15 DIAGNOSIS — E1142 Type 2 diabetes mellitus with diabetic polyneuropathy: Secondary | ICD-10-CM | POA: Diagnosis not present

## 2016-01-15 DIAGNOSIS — I10 Essential (primary) hypertension: Secondary | ICD-10-CM | POA: Diagnosis not present

## 2016-01-15 DIAGNOSIS — I110 Hypertensive heart disease with heart failure: Secondary | ICD-10-CM | POA: Diagnosis not present

## 2016-01-15 DIAGNOSIS — E1165 Type 2 diabetes mellitus with hyperglycemia: Secondary | ICD-10-CM | POA: Diagnosis not present

## 2016-01-15 DIAGNOSIS — E034 Atrophy of thyroid (acquired): Secondary | ICD-10-CM | POA: Diagnosis not present

## 2016-01-15 DIAGNOSIS — F331 Major depressive disorder, recurrent, moderate: Secondary | ICD-10-CM | POA: Diagnosis not present

## 2016-01-15 DIAGNOSIS — E782 Mixed hyperlipidemia: Secondary | ICD-10-CM | POA: Diagnosis not present

## 2016-01-19 ENCOUNTER — Encounter: Payer: Self-pay | Admitting: Emergency Medicine

## 2016-01-19 ENCOUNTER — Ambulatory Visit (INDEPENDENT_AMBULATORY_CARE_PROVIDER_SITE_OTHER): Payer: Medicare Other | Admitting: Emergency Medicine

## 2016-01-19 VITALS — BP 130/60 | HR 119 | Ht 65.0 in | Wt 185.6 lb

## 2016-01-19 DIAGNOSIS — J9611 Chronic respiratory failure with hypoxia: Secondary | ICD-10-CM | POA: Diagnosis not present

## 2016-01-19 DIAGNOSIS — I2510 Atherosclerotic heart disease of native coronary artery without angina pectoris: Secondary | ICD-10-CM

## 2016-01-19 DIAGNOSIS — J849 Interstitial pulmonary disease, unspecified: Secondary | ICD-10-CM

## 2016-01-19 DIAGNOSIS — J309 Allergic rhinitis, unspecified: Secondary | ICD-10-CM

## 2016-01-19 DIAGNOSIS — R911 Solitary pulmonary nodule: Secondary | ICD-10-CM | POA: Diagnosis not present

## 2016-01-19 DIAGNOSIS — J441 Chronic obstructive pulmonary disease with (acute) exacerbation: Secondary | ICD-10-CM

## 2016-01-19 NOTE — Assessment & Plan Note (Signed)
Continue your fexofenadine and nasal spray as you have been taking them

## 2016-01-19 NOTE — Assessment & Plan Note (Addendum)
No plan for repeat CT scan at this time. Last CT scan was February 2017 and was reassuring without any evidence for a worrisome nodule.

## 2016-01-19 NOTE — Progress Notes (Signed)
[name] is a 75 year old woman who follows up today for her COPD, dyspnea, and for a history of a RLL pulmonary nodule. 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 No sgy has been planned due to her significant lung disease. Also with hx depression.    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 [facility] was [facility], was found to be hypercapnic at baseline at that time and it was recommended that she start on BiPAP daily at bedtime. She's also been managed on chronic prednisone 20mg , DuoNeb when necessary. At her last visit it was recommended that she should try stopping Spiriva and start Stiolto. She very much prefers the [employer]. She is having nasal gtt. Is on allegra, doesn't feel that it kelps her. flonase nasal spray + [place].   12/21/2015 Post hospital follow up w T Parrett: COPD /ILD /Hypoxic Resp Failure  Pt returns for a post hospital follow up.  Patient was recently admitted 12/01/2015 for COPD exacerbation with hypercarbic failure. She did require BiPAP support. She has had multiple hospitalizations from hypercarbic [place] failure. It has been recommended for her to begin a Trilogy noninvasive ventilator at bedtime. Social work was contacted to help with arrangement of trilogy machine. This was started when she got home. Wears for at least 8hr each night.  PCO2 was 81. 2-D echo showed an ejection action of 60-65%, impaired relaxation, mild mitral, tricuspid and aortic regurgitation. Feels much better. More alert in am and during daytime . Breathing does feel some better.  Gets winded easily .  On Stiolto now. Likes it best .  On 2l/m O2 now.  Denies chest pain, orthopnea, increased edema or fever. Says I need to call [employer] at [facility] , [phone] to discuss approval for Trilogy.  PVX and Prevnar utd.   01/19/16 -- 75 year old woman with a history of chronic hypoxemic  respiratory failure in the setting of severe COPD, slowly progressive interstitial lung disease. She was hospitalized in June as detailed above. The discharge she was started on a Trilogy mechanical ventilator and she states that she has been reliable with this. She feels a bit better, but notes that her exertional tolerance hasn't changes - able to do very little without stopping to rest. No flares since her hospitalization in June. Currently on Stiolto, albuterol prn, about 2x a week. Uses nebs about once a day.   EXAM  Filed Vitals:   01/19/16 1354  BP: 130/60  Pulse: 119  Height: 5\' 5"  (1.651 m)  Weight: 185 lb 9.6 oz (84.188 kg)  SpO2: 90%    Gen: Pleasant, obese, in no distress, elderly and chronically ill appearing   ENT: No lesions,  mouth clear,  oropharynx clear, no postnasal drip  Neck: No JVD, no TMG, no carotid bruits  Lungs: Distant  BS w/ no wheezing, very distant  Cardiovascular: RRR, heart sounds normal, no murmur or gallops, trace edema  Musculoskeletal: No deformities, no cyanosis or clubbing  Neuro: alert, non focal  Skin: Warm, no lesions or rashes   12/19/11 Myoview:  Overall Impression: Normal stress nuclear study with a small, mild, fixed apical defect consistent with thinning; no ischemia.  LV Ejection Fraction: 79%. LV Wall Motion: NL LV Function; NL Wall Motion  08/28/15 --  COMPARISON: Chest CT 08/28/2014.  FINDINGS: Mediastinum/Lymph Nodes: Heart size is normal. There is no significant pericardial fluid, thickening or pericardial calcification. There is  atherosclerosis of the thoracic aorta, the great vessels of the mediastinum and the coronary arteries, including calcified atherosclerotic plaque in the left main, left anterior descending, left circumflex and right coronary arteries. Calcifications of the aortic valve. Mild calcifications of the mitral annulus. Aneurysmal dilatation of the ascending thoracic aorta which measures up to 5.4 cm  in diameter (previous 5.3 cm). The thoracic aortic arch is ectatic measuring up to 3.8 cm in diameter just distal to the origin of the left subclavian artery. The isthmus of the thoracic aorta is aneurysmal measuring 5.2 cm in diameter (previously 4.9 cm in diameter when measured in a similar fashion on the prior examination). Descending thoracic aorta is also mildly aneurysmal measuring up to the 4.4 cm in diameter at the level of the pulmonic trunk (previously 4.2 cm in diameter). No pathologically enlarged mediastinal or hilar lymph nodes. Please note that accurate exclusion of hilar adenopathy is limited on noncontrast CT scans. Esophagus is unremarkable in appearance. No axillary lymphadenopathy.  Lungs/Pleura: No acute consolidative airspace disease. No pleural effusions. No suspicious appearing pulmonary nodules or masses. Mild diffuse bronchial wall thickening with moderate centrilobular and mild paraseptal emphysema.  Upper Abdomen: Unremarkable.  Musculoskeletal/Soft Tissues: Old compression fracture of T10 with approximately 60% loss of anterior vertebral body height is unchanged. There is a new compression fracture of T12 with approximately 50% loss of anterior vertebral body height, which does not appear to be acute. There are no aggressive appearing lytic or blastic lesions noted in the visualized portions of the skeleton.  IMPRESSION: 1. Slow progressive enlargement of the aneurysmal segments of the thoracic aorta, as detailed above. This is most significant in the ascending thoracic aorta where the maximal diameter is currently 5.4 mm (previously 5.3 mm). The greatest increase in diameter has occurred in the isthmus, which currently measures 5.2 cm in diameter (previously 4.9 cm in diameter). 2. There are calcifications of the aortic valve. Echocardiographic correlation for evaluation of potential valvular dysfunction may be warranted if clinically  indicated. 3. Left main and 3 vessel coronary artery disease again noted. 4. Mild diffuse bronchial thickening with moderate centrilobular and mild paraseptal emphysema; imaging findings suggestive of underlying COPD. 5. Additional incidental findings, as above.    Allergic rhinitis Continue your fexofenadine and nasal spray as you have been taking them   Pulmonary nodule No plan for repeat CT scan at this time. Last CT scan was February 2017 and was reassuring without any evidence for a worrisome nodule.  COPD (chronic obstructive pulmonary disease) Please continue your Stiolto and albuterol as you have been taking them  Continue your DuoNebs when needed Continue your oxygen at 2-3L/min continuous flow. Our goal is to keep your oxygen saturations between 88-92%.  Please continue your Trilogy device every night Follow with Dr Delton Coombes in 3 months or sooner if you have any problems.  Chronic respiratory failure Now started on Trilogy device. She appears to be benefiting. The insurance has been taken care of  Interstitial lung disease (HCC) Following clinically and by serial chest x-rays for now.    Levy Pupa, MD, PhD 01/19/2016, 2:13 PM [facility] [phone] or if no answer [phone]

## 2016-01-19 NOTE — Assessment & Plan Note (Signed)
Now started on Trilogy device. She appears to be benefiting. The insurance has been taken care of

## 2016-01-19 NOTE — Assessment & Plan Note (Signed)
Following clinically and by serial chest x-rays for now.

## 2016-01-19 NOTE — Assessment & Plan Note (Signed)
Please continue your Stiolto and albuterol as you have been taking them  Continue your DuoNebs when needed Continue your oxygen at 2-3L/min continuous flow. Our goal is to keep your oxygen saturations between 88-92%.  Please continue your Trilogy device every night Follow with Dr Delton CoombesByrum in 3 months or sooner if you have any problems.

## 2016-01-19 NOTE — Patient Instructions (Addendum)
Please continue your Stiolto and albuterol as you have been taking them  Continue your DuoNebs when needed Continue your fexofenadine and nasal spray as you have been taking them  Continue your oxygen at 2-3L/min continuous flow. Our goal is to keep your oxygen saturations between 88-92%.  Please continue your Trilogy device every night Follow with Dr Delton CoombesByrum in 3 months or sooner if you have any problems.

## 2016-01-20 IMAGING — CT CT CHEST W/O CM
1 of 4 series · 15 of 31 positions shown, 19 images · non-contrast
Comparison: CT thorax 11/06/2012

CLINICAL DATA: Evaluate size of thoracic aorta.

EXAM:
CT CHEST WITHOUT CONTRAST
TECHNIQUE: Multidetector CT imaging of the chest was performed following the
standard protocol without IV contrast..

[Series 2: thin chest · axial · 0.61mm/px · z∈[-298,-44]mm · 15 of 287 slices shown, 19 images]
[im 17/287  mediastinal]
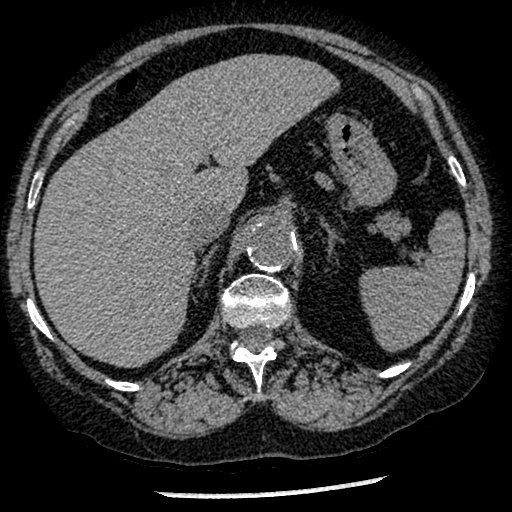
[im 17/287  lung]
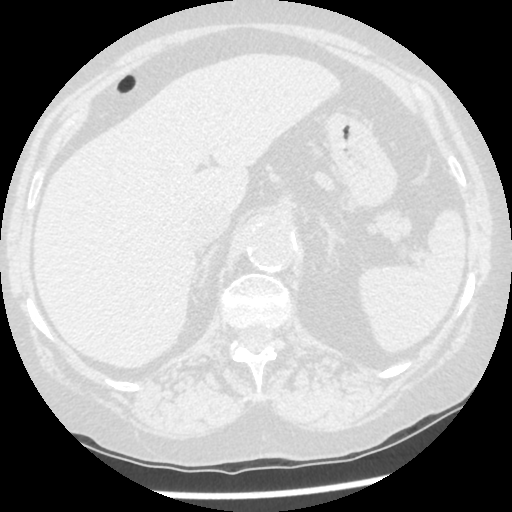
[im 34/287  lung]
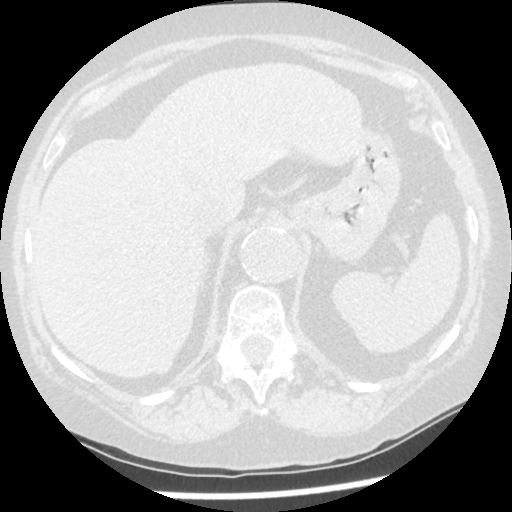
[im 51/287  lung]
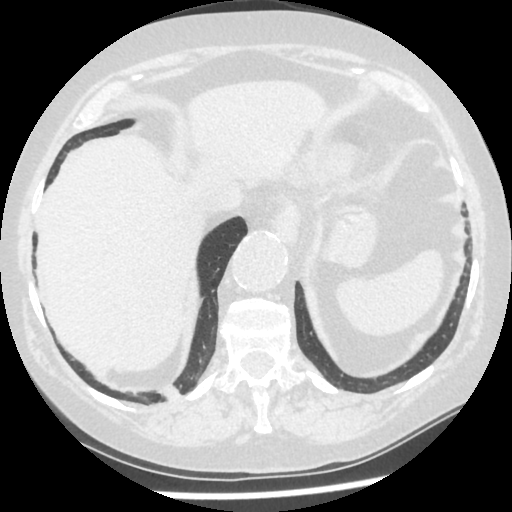
[im 68/287  lung]
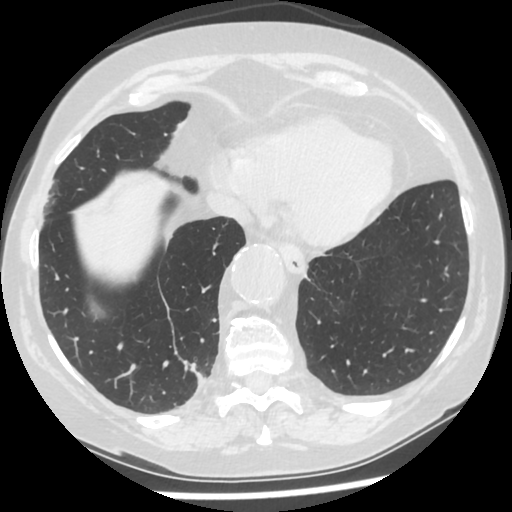
[im 101/287  mediastinal]
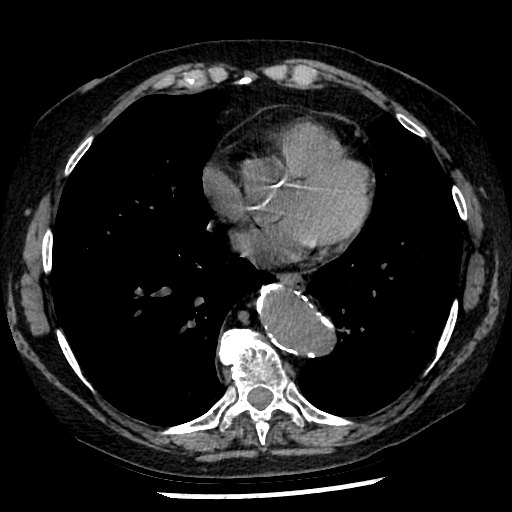
[im 101/287  lung]
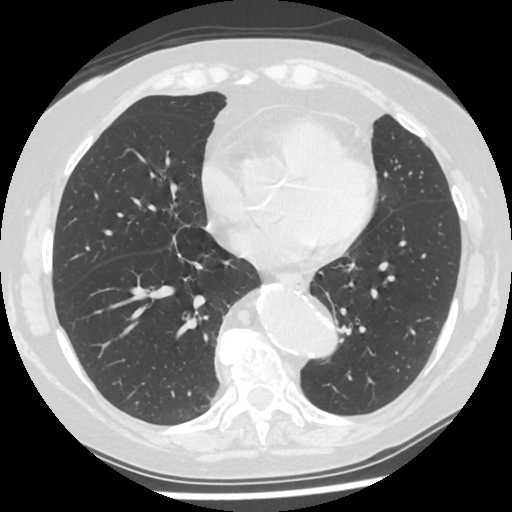
[im 118/287  lung]
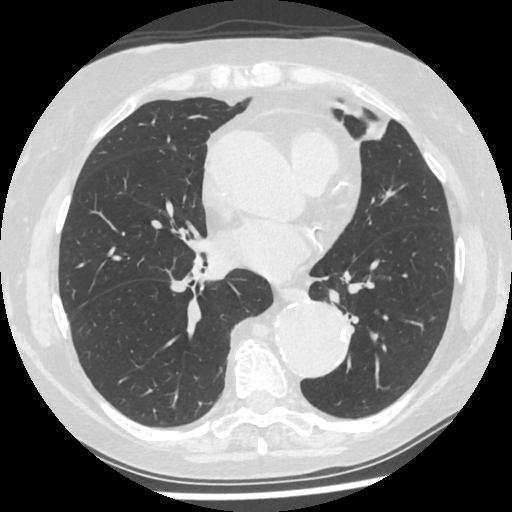
[im 135/287  lung]
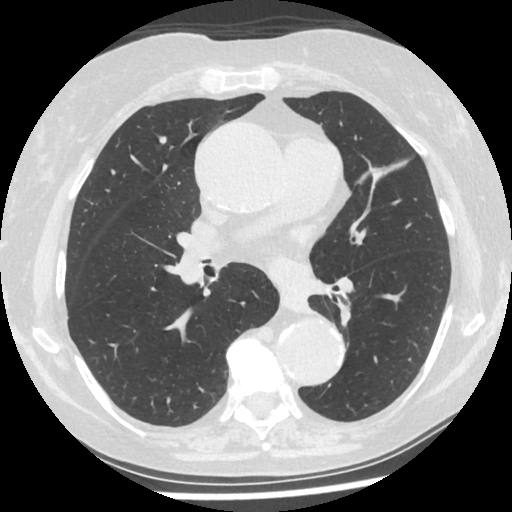
[im 144/287  lung]
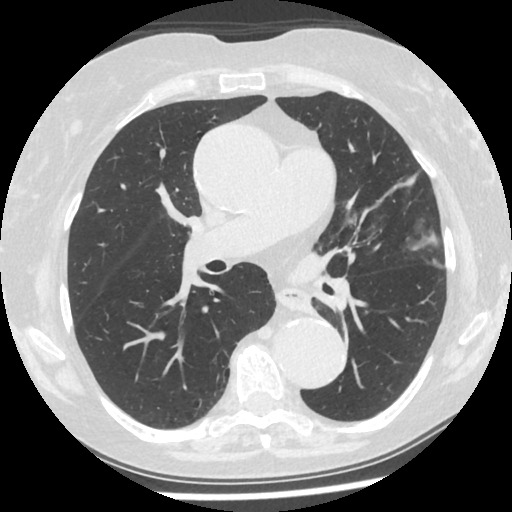
[im 152/287  mediastinal]
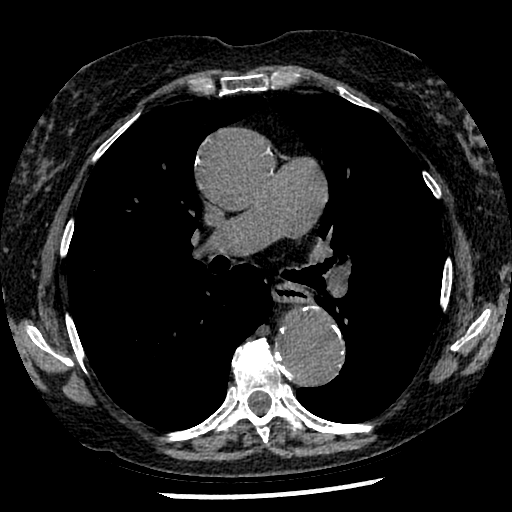
[im 152/287  lung]
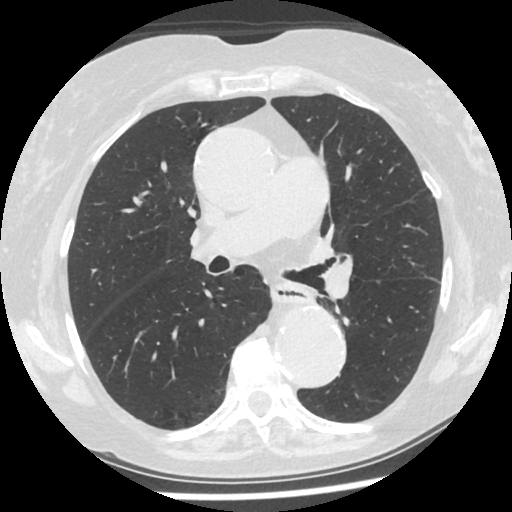
[im 169/287  lung]
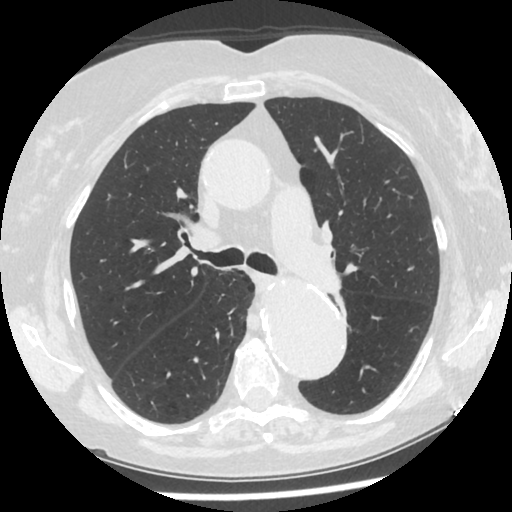
[im 186/287  lung]
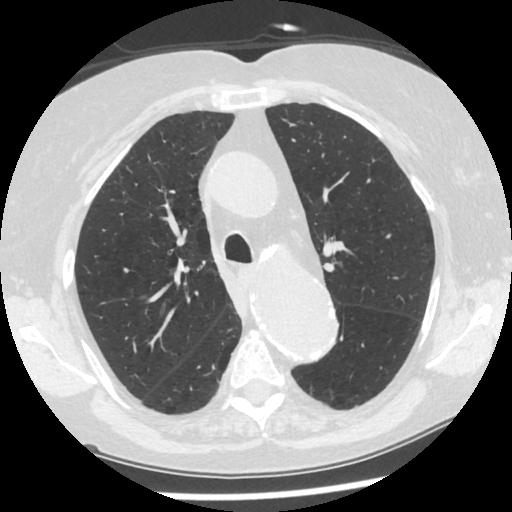
[im 219/287  lung]
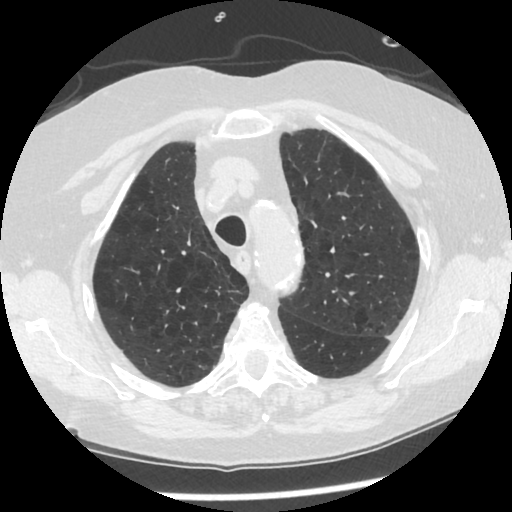
[im 236/287  mediastinal]
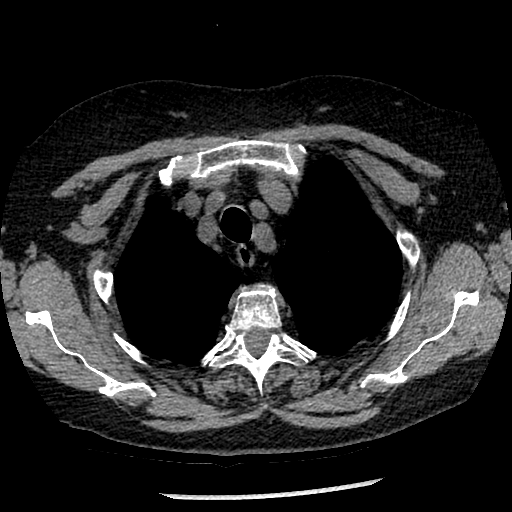
[im 236/287  lung]
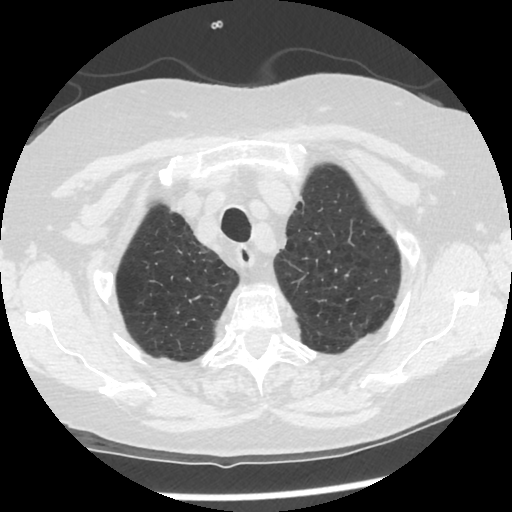
[im 253/287  lung]
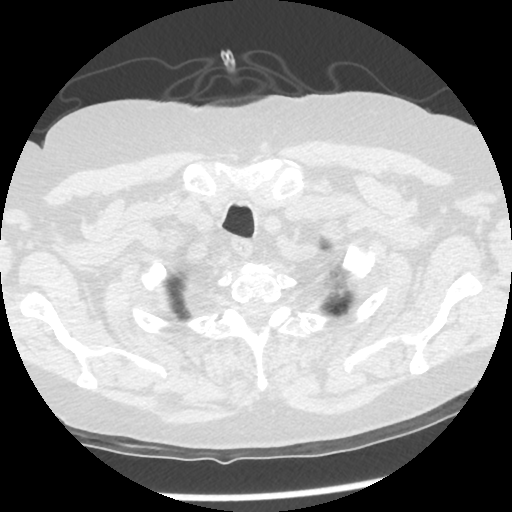
[im 270/287  lung]
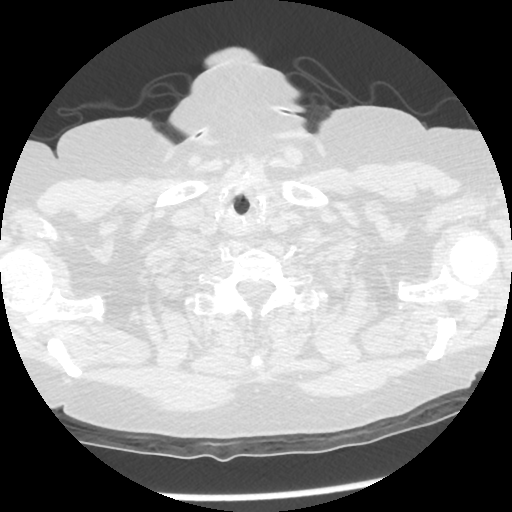

[15 of 31 positions shown; findings below may reference images not displayed]

FINDINGS: Ascending aorta measures 53 mm in maximum dimension compared to 52
mm on 11/06/2012. The descending thoracic aorta measures 42 mm
compared to 40 mm on prior (image 30, series 3). The transverse arch
measures 44 mm compared to 42 mm on prior. There is intimal
calcification aorta. The great vessels are normal. No pericardial
fluid. The descending thoracic aorta is ectatic.

Mild centrilobular emphysema in the upper lobes. No measurable
nodularity.

Limited view of the upper abdomen is unremarkable. Limited view
skeleton demonstrates compression fracture at T11 which is new from
12/05/2013 with approximately 60% loss of vertebral body height
anteriorly. No retropulsion.
IMPRESSION: 1. Ascending aorta measures 1 mm larger at 53 mm compared to
12/05/2013 .
2. Descending thoracic aorta measures 2 mm larger at 42 mm.
3. Subacute compression fracture at T11 is new from 12/05/2013.

## 2016-01-26 ENCOUNTER — Other Ambulatory Visit: Payer: Self-pay

## 2016-01-26 DIAGNOSIS — J441 Chronic obstructive pulmonary disease with (acute) exacerbation: Secondary | ICD-10-CM

## 2016-01-26 NOTE — Patient Outreach (Signed)
[facility]) Care Management  [facility] Care Manager  01/26/2016   Morgan Hays 11-14-1940 [id number]  Initial telephonic assessment completed for Health Coach/disease management services.  Patient reports she is using her COPD Action Plan, and taking medications/breathing treatments as ordered.  She uses Oxygen at 3L/min continuously, and uses a Trilogy at night.  Encounter Medications:  Outpatient Encounter Prescriptions as of 01/26/2016  Medication Sig Note  . albuterol (PROVENTIL HFA;VENTOLIN HFA) 108 (90 Base) MCG/ACT inhaler Inhale 1-2 puffs into the lungs every 6 (six) hours as needed for wheezing or shortness of breath.   [name] amLODipine (NORVASC) 5 MG tablet Take 5 mg by mouth daily.   [name] aspirin 81 MG tablet Take 81 mg by mouth daily.     . butalbital-acetaminophen-caffeine (FIORICET WITH CODEINE) 50-325-40-30 MG per capsule Take 1 capsule by mouth every 6 (six) hours as needed.     . Calcium Carbonate (CALCIUM 500 PO) Take 1 capsule by mouth daily. Reported on 12/22/2015   . calcium carbonate (OS-CAL) 600 MG TABS tablet Take 600 mg by mouth daily with breakfast. Reported on 11/25/2015   . Cholecalciferol (VITAMIN D) 1000 UNITS capsule Take 1,000 Units by mouth daily.     . clonazePAM (KLONOPIN) 1 MG tablet Take 1 mg by mouth daily as needed for anxiety. Take one tablet daily as needed   . diphenhydrAMINE (SOMINEX) 25 MG tablet Take 25 mg by mouth at bedtime as needed for sleep. Reported on 11/25/2015   . fexofenadine (ALLEGRA) 180 MG tablet Take 180 mg by mouth daily.   . fluticasone (FLONASE) 50 MCG/ACT nasal spray Place 2 sprays into both nostrils daily.   . furosemide (LASIX) 20 MG tablet Take 40 mg by mouth. Take one tablet once daily as needed for swelling   . gabapentin (NEURONTIN) 100 MG capsule Take 300 mg by mouth 2 (two) times daily.    [name] ipratropium-albuterol (DUONEB) 0.5-2.5 (3) MG/3ML SOLN Take 3 mLs by nebulization every 4 (four) hours as needed.    . Iron-FA-B  Cmp-C-Biot-Probiotic (FUSION PLUS) CAPS Take 1 capsule by mouth daily.   [name] levothyroxine (SYNTHROID, LEVOTHROID) 175 MCG tablet Take 150 mcg by mouth daily before breakfast.    . losartan (COZAAR) 100 MG tablet Take 100 mg by mouth daily.   . magnesium oxide (MAG-OX) 400 MG tablet Take 400 mg by mouth daily.     . metFORMIN (GLUCOPHAGE-XR) 500 MG 24 hr tablet 1 tablet daily. 10/23/2013: Received from: External Pharmacy Received Sig:   . montelukast (SINGULAIR) 10 MG tablet Take 1 tablet (10 mg total) by mouth at bedtime.   [name] omeprazole (PRILOSEC) 20 MG capsule Take 1 capsule (20 mg total) by mouth daily.   . Oxycodone-Acetaminophen (PERCOCET PO) Take 5-325 mg by mouth every 8 (eight) hours as needed.    . pravastatin (PRAVACHOL) 40 MG tablet TAKE 1 TABLET ONCE DAILY.   [name] predniSONE (DELTASONE) 20 MG tablet Take 1 tablet (20 mg total) by mouth daily. (Patient taking differently: Take 20 mg by mouth daily. Taking 2 tablets x4 days then back to 1 daily)   . sertraline (ZOLOFT) 50 MG tablet Take 50 mg by mouth 2 (two) times daily. Reported on 09/07/2015   . Tiotropium Bromide-Olodaterol (STIOLTO RESPIMAT) 2.5-2.5 MCG/ACT AERS Inhale 2 puffs into the lungs daily.    No facility-administered encounter medications on file as of 01/26/2016.     Functional Status:  In your present state of health, do you have any difficulty performing the  following activities: 01/26/2016 12/22/2015  Hearing? N N  Vision? N N  Difficulty concentrating or making decisions? N Y  Walking or climbing stairs? Y N  Dressing or bathing? N N  Doing errands, shopping? Morgan Hays  Preparing Food and eating ? N Y  Using the Toilet? N N  In the past six months, have you accidently leaked urine? N Y  Do you have problems with loss of bowel control? N Y  Managing your Medications? N N  Managing your Finances? N N  Housekeeping or managing your Housekeeping? Morgan Hays  Some recent data might be hidden    Fall/Depression Screening: PHQ 2/9  Scores 01/26/2016 12/22/2015 11/25/2015 09/29/2015 07/24/2015 06/22/2015 03/23/2015  PHQ - 2 Score 2 1 0 1 2 2 1   PHQ- 9 Score 2 - - - 8 5 3     Plan: Admit patient to telephonic disease management services.          Patient will utilize Action Plan to self-manage her COPD.          Patient will review EMMI materials on low salt dietary guidelines.          RN will mail EMMI materials.          RN will do monthly follow up in August.  Tyler Deis, RN, MSN RN [facility] [employer] [phone] Fax [phone]

## 2016-02-08 ENCOUNTER — Other Ambulatory Visit: Payer: Self-pay | Admitting: Cardiology

## 2016-02-23 ENCOUNTER — Other Ambulatory Visit: Payer: Self-pay

## 2016-02-23 NOTE — Patient Outreach (Signed)
[facility] Care Management  02/23/2016  Humberto LeepJudy Whetsel 09/30/1940 [id number]  Unsuccessful attempt to reach patient by phone.  Unable to leave message.  RN will make another attempt within 10 working days.  Tyler Deisonnie Ethelle Ola, RN, MSN RN [employer] [phone] Fax [phone]

## 2016-03-02 ENCOUNTER — Other Ambulatory Visit: Payer: Self-pay

## 2016-03-02 DIAGNOSIS — J441 Chronic obstructive pulmonary disease with (acute) exacerbation: Secondary | ICD-10-CM

## 2016-03-02 NOTE — Patient Outreach (Signed)
[facility] Care Management  03/02/2016  Morgan LeepJudy Dunlap 06/21/1941 [id number]   Telephonic assessment today with patient.  She reports feeling tired this morning.  She reports using her COPD Action Plan to monitor her respiratory status, and reports no change in respiratory symptoms. Patient states she will call MD if symptoms develop.  She continues using O2 at 3L/minute continuously.  Patient reports an ongoing issue with pedal edema.  Education was provided re elevating her feet when sitting. Patient reports she received EMMI materials on low salt dietary guidelines.  She states the information was helpful, and that she has stopped adding salt at the table.  Plan:  Patient will continue using COPD Action Plan to monitor for changes in respiratory symptoms.           Patient will practice safety/fall precautions.           Patient will implement low salt dietary guidelines and elevate feet when sitting.           RN will follow up in September.  Tyler Deisonnie Jylian Pappalardo, RN, MSN RN [employer] [phone] Fax [phone]

## 2016-03-12 DIAGNOSIS — J918 Pleural effusion in other conditions classified elsewhere: Secondary | ICD-10-CM | POA: Diagnosis not present

## 2016-03-12 DIAGNOSIS — R05 Cough: Secondary | ICD-10-CM | POA: Diagnosis not present

## 2016-03-12 DIAGNOSIS — R0602 Shortness of breath: Secondary | ICD-10-CM | POA: Diagnosis not present

## 2016-03-25 DIAGNOSIS — J440 Chronic obstructive pulmonary disease with acute lower respiratory infection: Secondary | ICD-10-CM | POA: Diagnosis not present

## 2016-03-25 DIAGNOSIS — R05 Cough: Secondary | ICD-10-CM | POA: Diagnosis not present

## 2016-03-25 DIAGNOSIS — E860 Dehydration: Secondary | ICD-10-CM | POA: Diagnosis not present

## 2016-03-25 DIAGNOSIS — R0602 Shortness of breath: Secondary | ICD-10-CM | POA: Diagnosis not present

## 2016-03-25 DIAGNOSIS — J449 Chronic obstructive pulmonary disease, unspecified: Secondary | ICD-10-CM | POA: Diagnosis not present

## 2016-03-30 ENCOUNTER — Other Ambulatory Visit: Payer: Self-pay

## 2016-03-30 DIAGNOSIS — J441 Chronic obstructive pulmonary disease with (acute) exacerbation: Secondary | ICD-10-CM

## 2016-03-30 NOTE — Patient Outreach (Signed)
[facility] Care Management  03/30/2016  Humberto LeepJudy Hays 11/05/1940 [id number]   Telephone contact with patient.  She reports recent treatment for pneumonia.  Has a follow-up appointment tomorrow with pulmonologist.  Patient reports on-going pedal edema which does not resolve overnight.  She states she has tried to keep her feet elevated, and continues to adher to low salt dietary guidelines.  Plan:  Patient will keep follow-up appointment scheduled for tomorrow.           Patient will continue using COPD Action Plan to self-manage her condition.           Patient will report pedal edema to PCP.           RN will follow-up in October.  Tyler Deisonnie Ailani Governale, RN, MSN RN [employer] [phone] Fax [phone]

## 2016-03-31 ENCOUNTER — Encounter: Payer: Self-pay | Admitting: *Deleted

## 2016-03-31 ENCOUNTER — Encounter: Payer: Self-pay | Admitting: Emergency Medicine

## 2016-03-31 ENCOUNTER — Ambulatory Visit (INDEPENDENT_AMBULATORY_CARE_PROVIDER_SITE_OTHER): Payer: Medicare Other | Admitting: Emergency Medicine

## 2016-03-31 DIAGNOSIS — Z23 Encounter for immunization: Secondary | ICD-10-CM | POA: Diagnosis not present

## 2016-03-31 DIAGNOSIS — I2510 Atherosclerotic heart disease of native coronary artery without angina pectoris: Secondary | ICD-10-CM | POA: Diagnosis not present

## 2016-03-31 DIAGNOSIS — J441 Chronic obstructive pulmonary disease with (acute) exacerbation: Secondary | ICD-10-CM

## 2016-03-31 NOTE — Assessment & Plan Note (Signed)
Recent AE, recovering  Please continue your medications as you are taking them  Taper prednisone back down to 20mg  as planned Titrate your oxygen to keep Spo2 aroung 90% with exertion and at rest.  Flu shot today Follow with Dr Delton CoombesByrum in 3 months or sooner if you have any problems.

## 2016-03-31 NOTE — Progress Notes (Signed)
Morgan Hays is a 75 year old woman who follows up today for her COPD, dyspnea, and for a history of a RLL pulmonary nodule. 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 No sgy has been planned due to her significant lung disease. Also with hx depression.    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 She was admitted March 2017 at [facility], was found to be hypercapnic at baseline at that time and it was recommended that she start on BiPAP daily at bedtime. She's also been managed on chronic prednisone 20mg , DuoNeb when necessary. At her last visit it was recommended that she should try stopping Spiriva and start Stiolto. She very much prefers the [employer]. She is having nasal gtt. Is on allegra, doesn't feel that it kelps her. flonase nasal spray + [place].   12/21/2015 Post hospital follow up w T Parrett: COPD /ILD /Hypoxic Resp Failure  Pt returns for a post hospital follow up.  Patient was recently admitted 12/01/2015 for COPD exacerbation with hypercarbic failure. She did require BiPAP support. She has had multiple hospitalizations from hypercarbic [place] failure. It has been recommended for her to begin a Trilogy noninvasive ventilator at bedtime. Social work was contacted to help with arrangement of trilogy machine. This was started when she got home. Wears for at least 8hr each night.  PCO2 was 81. 2-D echo showed an ejection action of 60-65%, impaired relaxation, mild mitral, tricuspid and aortic regurgitation. Feels much better. More alert in am and during daytime . Breathing does feel some better.  Gets winded easily .  On Stiolto now. Likes it best .  On 2l/m O2 now.  Denies chest pain, orthopnea, increased edema or fever. Says I need to call [place] at [facility] , [phone] to discuss approval for Trilogy.  PVX and Prevnar utd.   01/19/16 -- 75 year old woman with a history of chronic hypoxemic  respiratory failure in the setting of severe COPD, slowly progressive interstitial lung disease. She was hospitalized in June as detailed above. The discharge she was started on a Trilogy mechanical ventilator and she states that she has been reliable with this. She feels a bit better, but notes that her exertional tolerance hasn't changes - able to do very little without stopping to rest. No flares since her hospitalization in June. Currently on Stiolto, albuterol prn, about 2x a week. Uses nebs about once a day.   ROV 03/31/16 -- this is a follow-up visit for patient with a history of chronic hypoxemic respiratory failure in the setting of interstitial disease as well as superimposed COPD. Using a trilogy device for respiratory support at night. Her exercise tolerance is poor, on 2L/min. Recently seen at urgent care and then ED at [facility] treated for PNA, pleurisy. Received solumedrol, increase pred from her usual 20mg  a day. Her cough is better, breathing about the same. She has wheeze through the day. She is on stiolto, duonebs q4h.   EXAM  Vitals:   03/31/16 0915  BP: 120/60  Pulse: 94  SpO2: 98%  Weight: 187 lb (84.8 kg)  Height: 5\' 5"  (1.651 m)    Gen: Pleasant, obese, in no distress, elderly and chronically ill appearing   ENT: No lesions,  mouth clear,  oropharynx clear, no postnasal drip  Neck: No JVD, no TMG, no carotid bruits  Lungs: Distant  BS w/ no wheezing, very distant  Cardiovascular: RRR, heart sounds normal, no murmur or  gallops, trace edema  Musculoskeletal: No deformities, no cyanosis or clubbing  Neuro: alert, non focal  Skin: Warm, no lesions or rashes   12/19/11 Myoview:  Overall Impression: Normal stress nuclear study with a small, mild, fixed apical defect consistent with thinning; no ischemia.  LV Ejection Fraction: 79%. LV Wall Motion: NL LV Function; NL Wall Motion  08/28/15 --  COMPARISON: Chest CT 08/28/2014.  FINDINGS: Mediastinum/Lymph Nodes:  Heart size is normal. There is no significant pericardial fluid, thickening or pericardial calcification. There is atherosclerosis of the thoracic aorta, the great vessels of the mediastinum and the coronary arteries, including calcified atherosclerotic plaque in the left main, left anterior descending, left circumflex and right coronary arteries. Calcifications of the aortic valve. Mild calcifications of the mitral annulus. Aneurysmal dilatation of the ascending thoracic aorta which measures up to 5.4 cm in diameter (previous 5.3 cm). The thoracic aortic arch is ectatic measuring up to 3.8 cm in diameter just distal to the origin of the left subclavian artery. The isthmus of the thoracic aorta is aneurysmal measuring 5.2 cm in diameter (previously 4.9 cm in diameter when measured in a similar fashion on the prior examination). Descending thoracic aorta is also mildly aneurysmal measuring up to the 4.4 cm in diameter at the level of the pulmonic trunk (previously 4.2 cm in diameter). No pathologically enlarged mediastinal or hilar lymph nodes. Please note that accurate exclusion of hilar adenopathy is limited on noncontrast CT scans. Esophagus is unremarkable in appearance. No axillary lymphadenopathy.  Lungs/Pleura: No acute consolidative airspace disease. No pleural effusions. No suspicious appearing pulmonary nodules or masses. Mild diffuse bronchial wall thickening with moderate centrilobular and mild paraseptal emphysema.  Upper Abdomen: Unremarkable.  Musculoskeletal/Soft Tissues: Old compression fracture of T10 with approximately 60% loss of anterior vertebral body height is unchanged. There is a new compression fracture of T12 with approximately 50% loss of anterior vertebral body height, which does not appear to be acute. There are no aggressive appearing lytic or blastic lesions noted in the visualized portions of the skeleton.  IMPRESSION: 1. Slow progressive  enlargement of the aneurysmal segments of the thoracic aorta, as detailed above. This is most significant in the ascending thoracic aorta where the maximal diameter is currently 5.4 mm (previously 5.3 mm). The greatest increase in diameter has occurred in the isthmus, which currently measures 5.2 cm in diameter (previously 4.9 cm in diameter). 2. There are calcifications of the aortic valve. Echocardiographic correlation for evaluation of potential valvular dysfunction may be warranted if clinically indicated. 3. Left main and 3 vessel coronary artery disease again noted. 4. Mild diffuse bronchial thickening with moderate centrilobular and mild paraseptal emphysema; imaging findings suggestive of underlying COPD. 5. Additional incidental findings, as above.    COPD (chronic obstructive pulmonary disease) Recent AE, recovering  Please continue your medications as you are taking them  Taper prednisone back down to 20mg  as planned Titrate your oxygen to keep Spo2 aroung 90% with exertion and at rest.  Flu shot today Follow with Dr Delton CoombesByrum in 3 months or sooner if you have any problems.   Levy Pupaobert Byrum, MD, PhD 03/31/2016, 9:41 AM [facility] [phone] or if no answer [phone]

## 2016-03-31 NOTE — Patient Instructions (Signed)
Please continue your medications as you are taking them  Taper prednisone back down to 20mg  as planned Titrate your oxygen to keep Spo2 aroung 90% with exertion and at rest.  Flu shot today Follow with Dr Delton CoombesByrum in 3 months or sooner if you have any problems.

## 2016-04-15 DIAGNOSIS — J158 Pneumonia due to other specified bacteria: Secondary | ICD-10-CM | POA: Diagnosis not present

## 2016-04-15 DIAGNOSIS — I5032 Chronic diastolic (congestive) heart failure: Secondary | ICD-10-CM | POA: Diagnosis not present

## 2016-04-15 DIAGNOSIS — I110 Hypertensive heart disease with heart failure: Secondary | ICD-10-CM | POA: Diagnosis not present

## 2016-04-15 DIAGNOSIS — E1142 Type 2 diabetes mellitus with diabetic polyneuropathy: Secondary | ICD-10-CM | POA: Diagnosis not present

## 2016-04-15 DIAGNOSIS — E782 Mixed hyperlipidemia: Secondary | ICD-10-CM | POA: Diagnosis not present

## 2016-04-15 DIAGNOSIS — F331 Major depressive disorder, recurrent, moderate: Secondary | ICD-10-CM | POA: Diagnosis not present

## 2016-04-15 DIAGNOSIS — E034 Atrophy of thyroid (acquired): Secondary | ICD-10-CM | POA: Diagnosis not present

## 2016-04-20 DIAGNOSIS — R0602 Shortness of breath: Secondary | ICD-10-CM | POA: Diagnosis not present

## 2016-04-20 DIAGNOSIS — J158 Pneumonia due to other specified bacteria: Secondary | ICD-10-CM | POA: Diagnosis not present

## 2016-04-22 ENCOUNTER — Ambulatory Visit: Payer: Medicare Other | Admitting: Emergency Medicine

## 2016-04-27 ENCOUNTER — Other Ambulatory Visit: Payer: Self-pay

## 2016-04-27 NOTE — Patient Outreach (Signed)
[facility] Care Management  04/27/2016  Morgan LeepJudy Hays 08/26/1940 [id number]   Unsuccessful attempt to reach patient by phone.  HIPAA appropriate message left requesting call back. If no call back, RN will make another attempt within 2 weeks.  Tyler Deisonnie Gordon Carlson, RN, MSN RN [employer] [facility] [phone] Fax [phone]

## 2016-04-28 ENCOUNTER — Other Ambulatory Visit: Payer: Self-pay | Admitting: *Deleted

## 2016-04-28 ENCOUNTER — Telehealth: Payer: Self-pay | Admitting: Emergency Medicine

## 2016-04-28 MED ORDER — MONTELUKAST SODIUM 10 MG PO TABS: 10.0000 mg | ORAL_TABLET | Freq: Every day | ORAL | 5 refills | Status: AC

## 2016-04-28 MED ORDER — PREDNISONE 20 MG PO TABS: 20.0000 mg | ORAL_TABLET | Freq: Every day | ORAL | 5 refills | Status: AC

## 2016-04-28 NOTE — Telephone Encounter (Signed)
Called [facility] and they did receive the refills that pt needed today. Nothing further is needed.

## 2016-05-10 ENCOUNTER — Other Ambulatory Visit: Payer: Self-pay

## 2016-05-10 DIAGNOSIS — J441 Chronic obstructive pulmonary disease with (acute) exacerbation: Secondary | ICD-10-CM

## 2016-05-10 NOTE — Patient Outreach (Signed)
[facility] Care Management  05/10/2016  Morgan LeepJudy Hays 09/06/1940 [id number]   Telephone assessment with patient.  She reports no acute COPD symptoms.  She reports a chronic cough, but no s/s infection.  She remains on continuous O2 at 3L/min.  Plan:  Patient will continue use of COPD Action Plan to monitor for changes.           Patient will continue efforts to limit salt in her diet.           RN will follow up in December.  Morgan Morgan Nesreen Albano, RN, MSN RN [employer] [facility] [phone] Fax [phone]

## 2016-05-11 ENCOUNTER — Ambulatory Visit: Payer: Self-pay

## 2016-06-07 ENCOUNTER — Other Ambulatory Visit: Payer: Self-pay

## 2016-06-07 DIAGNOSIS — J441 Chronic obstructive pulmonary disease with (acute) exacerbation: Secondary | ICD-10-CM

## 2016-06-07 NOTE — Patient Outreach (Signed)
[facility] Care Management  06/07/2016  Humberto LeepJudy Clayson 05/14/1941 [id number]   Telephonic assessment with patient.  She reports she is doing well with self-management of her COPD.  She also reports pedal edema is improved.  Plan:  Patient will continue self-management of COPD using Action Plan.           RN will follow up in January.  Tyler Deisonnie Prabhleen Montemayor, RN, MSN RN [employer] [facility] [phone] Fax [phone]

## 2016-06-12 DIAGNOSIS — J189 Pneumonia, unspecified organism: Secondary | ICD-10-CM | POA: Diagnosis not present

## 2016-06-23 DIAGNOSIS — I10 Essential (primary) hypertension: Secondary | ICD-10-CM | POA: Diagnosis not present

## 2016-06-23 DIAGNOSIS — R600 Localized edema: Secondary | ICD-10-CM | POA: Diagnosis not present

## 2016-07-05 ENCOUNTER — Ambulatory Visit: Payer: Self-pay

## 2016-07-08 ENCOUNTER — Ambulatory Visit: Payer: Medicare Other | Admitting: Emergency Medicine

## 2016-07-13 ENCOUNTER — Ambulatory Visit: Payer: Self-pay

## 2016-07-13 DIAGNOSIS — R000 Tachycardia, unspecified: Secondary | ICD-10-CM | POA: Diagnosis not present

## 2016-07-13 DIAGNOSIS — R600 Localized edema: Secondary | ICD-10-CM | POA: Diagnosis not present

## 2016-07-13 DIAGNOSIS — M25552 Pain in left hip: Secondary | ICD-10-CM | POA: Diagnosis not present

## 2016-07-14 ENCOUNTER — Other Ambulatory Visit: Payer: Self-pay

## 2016-07-14 VITALS — Ht 65.0 in | Wt 191.0 lb

## 2016-07-14 DIAGNOSIS — J441 Chronic obstructive pulmonary disease with (acute) exacerbation: Secondary | ICD-10-CM

## 2016-07-14 NOTE — Patient Outreach (Signed)
[facility]) Care Management  07/14/2016  Humberto LeepJudy Prom 04/29/1941 [id number]  Telephonic assessment completed with patient.  She reports she saw her PCP yesterday due to ongoing pedal edema.  Her weight is up to 191 pounds. Her Lasix dosage is now 80 mg in the am, and 40 mg in the afternoon. She states she is trying to keep her feet elevated.  Patient reports her heart rate was 110, and that she is being referred back to her cardiologist for evaluation.  Patient remains on continuous Oxygen at 3L/min.  She reports SOB with exertion, and states she is resting in bed frequently during the day.  She uses her COPD Action Plan to monitor symptoms. Education was provided on infection prevention.  Patient reports "sliding to the floor" from her hospital bed.  She reports no injuries.  Plan:  Patient will continue using COPD Action Plan.           Patient will avoid anyone with s/s infection, and practice good hand hygiene.           Patient will practice fall prevention strategies.           RN will follow up in February.  Tyler Deisonnie Royce Stegman, RN, MSN RN [employer] [facility] [phone] Fax [phone]

## 2016-07-18 ENCOUNTER — Telehealth: Payer: Self-pay | Admitting: Cardiology

## 2016-07-18 NOTE — Telephone Encounter (Signed)
[facility] for appointment on 08/12/16 with Dr Jens Somrenshaw. Records put with Dr Ludwig Clarksrenshaw's schedule for 08/12/16. lp

## 2016-08-02 ENCOUNTER — Ambulatory Visit: Payer: Medicare Other | Admitting: Emergency Medicine

## 2016-08-04 ENCOUNTER — Ambulatory Visit (INDEPENDENT_AMBULATORY_CARE_PROVIDER_SITE_OTHER): Payer: Medicare Other | Admitting: Emergency Medicine

## 2016-08-04 ENCOUNTER — Encounter: Payer: Self-pay | Admitting: Emergency Medicine

## 2016-08-04 DIAGNOSIS — J9611 Chronic respiratory failure with hypoxia: Secondary | ICD-10-CM

## 2016-08-04 DIAGNOSIS — J441 Chronic obstructive pulmonary disease with (acute) exacerbation: Secondary | ICD-10-CM

## 2016-08-04 MED ORDER — TIOTROPIUM BROMIDE-OLODATEROL 2.5-2.5 MCG/ACT IN AERS: 2.0000 | INHALATION_SPRAY | Freq: Every day | RESPIRATORY_TRACT | 0 refills | Status: AC

## 2016-08-04 NOTE — Patient Instructions (Addendum)
nnnPlease continue your stiolto daily Take albuterol 2 puffs up to every 4 hours if needed for shortness of breath.  Continue Duoneb as needed  Continue your oxygen at 3l/min Continue to wear your Trilogy every night.  Blood work tomorrow to check your kidney function on lasix.  Follow with Dr Delton CoombesByrum in 3 months or sooner if you have any problems.

## 2016-08-04 NOTE — Progress Notes (Signed)
Morgan Hays is a 76 year old woman who follows up today for her COPD, dyspnea, and for a history of a RLL pulmonary nodule. 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 No sgy has been planned due to her significant lung disease. Also with hx depression.    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 She was admitted March 2017 at [facility], was found to be hypercapnic at baseline at that time and it was recommended that she start on BiPAP daily at bedtime. She's also been managed on chronic prednisone 20mg , DuoNeb when necessary. At her last visit it was recommended that she should try stopping Spiriva and start Stiolto. She very much prefers the [employer]. She is having nasal gtt. Is on allegra, doesn't feel that it kelps her. flonase nasal spray + [place].   12/21/2015 Post hospital follow up w T Parrett: COPD /ILD /Hypoxic Resp Failure  Pt returns for a post hospital follow up.  Patient was recently admitted 12/01/2015 for COPD exacerbation with hypercarbic failure. She did require BiPAP support. She has had multiple hospitalizations from hypercarbic [place] failure. It has been recommended for her to begin a Trilogy noninvasive ventilator at bedtime. Social work was contacted to help with arrangement of trilogy machine. This was started when she got home. Wears for at least 8hr each night.  PCO2 was 81. 2-D echo showed an ejection action of 60-65%, impaired relaxation, mild mitral, tricuspid and aortic regurgitation. Feels much better. More alert in am and during daytime . Breathing does feel some better.  Gets winded easily .  On Stiolto now. Likes it best .  On 2l/m O2 now.  Denies chest pain, orthopnea, increased edema or fever. Says I need to call [place] at [facility] , [phone] to discuss approval for Trilogy.  PVX and Prevnar utd.   01/19/16 -- 76 year old woman with a history of chronic hypoxemic  respiratory failure in the setting of severe COPD, slowly progressive interstitial lung disease. She was hospitalized in June as detailed above. The discharge she was started on a Trilogy mechanical ventilator and she states that she has been reliable with this. She feels a bit better, but notes that her exertional tolerance hasn't changes - able to do very little without stopping to rest. No flares since her hospitalization in June. Currently on Stiolto, albuterol prn, about 2x a week. Uses nebs about once a day.   ROV 03/31/16 -- this is a follow-up visit for patient with a history of chronic hypoxemic respiratory failure in the setting of interstitial disease as well as superimposed COPD. Using a trilogy device for respiratory support at night. Her exercise tolerance is poor, on 2L/min. Recently seen at urgent care and then ED at [facility] treated for PNA, pleurisy. Received solumedrol, increase pred from her usual 20mg  a day. Her cough is better, breathing about the same. She has wheeze through the day. She is on stiolto, duonebs q4h.   ROV 08/04/16 -- chronic hypoxemic respiratory failure due to interstitial lung disease as well as COPD. She uses a trilogy device (download available and reviewed). Her SOB had been stable but severe. This past Sunday she had a rare episode of increased SOB and had to go on trilogy during the day. She has gained some wt, edema. Has gained about 10 lbs since 1 month ago. Lasix at 80mg  / 40mg   She sees Dr Jens Som next week to adjust.   EXAM  Vitals:   08/04/16 1127  BP: 110/82  Pulse: 100  SpO2: 96%  Weight: 194 lb 3.2 oz (88.1 kg)  Height: 5\' 5"  (1.651 m)    Gen: Pleasant, obese, in no distress, elderly and chronically ill appearing   ENT: No lesions,  mouth clear,  oropharynx clear, no postnasal drip  Neck: No JVD, no TMG, no carotid bruits  Lungs: Distant  BS w/ no wheezing, very distant  Cardiovascular: RRR, heart sounds normal, no murmur or gallops, trace  edema  Musculoskeletal: No deformities, no cyanosis or clubbing  Neuro: alert, non focal  Skin: Warm, no lesions or rashes   12/19/11 Myoview:  Overall Impression: Normal stress nuclear study with a small, mild, fixed apical defect consistent with thinning; no ischemia.  LV Ejection Fraction: 79%. LV Wall Motion: NL LV Function; NL Wall Motion  08/28/15 --  COMPARISON: Chest CT 08/28/2014.  FINDINGS: Mediastinum/Lymph Nodes: Heart size is normal. There is no significant pericardial fluid, thickening or pericardial calcification. There is atherosclerosis of the thoracic aorta, the great vessels of the mediastinum and the coronary arteries, including calcified atherosclerotic plaque in the left main, left anterior descending, left circumflex and right coronary arteries. Calcifications of the aortic valve. Mild calcifications of the mitral annulus. Aneurysmal dilatation of the ascending thoracic aorta which measures up to 5.4 cm in diameter (previous 5.3 cm). The thoracic aortic arch is ectatic measuring up to 3.8 cm in diameter just distal to the origin of the left subclavian artery. The isthmus of the thoracic aorta is aneurysmal measuring 5.2 cm in diameter (previously 4.9 cm in diameter when measured in a similar fashion on the prior examination). Descending thoracic aorta is also mildly aneurysmal measuring up to the 4.4 cm in diameter at the level of the pulmonic trunk (previously 4.2 cm in diameter). No pathologically enlarged mediastinal or hilar lymph nodes. Please note that accurate exclusion of hilar adenopathy is limited on noncontrast CT scans. Esophagus is unremarkable in appearance. No axillary lymphadenopathy.  Lungs/Pleura: No acute consolidative airspace disease. No pleural effusions. No suspicious appearing pulmonary nodules or masses. Mild diffuse bronchial wall thickening with moderate centrilobular and mild paraseptal emphysema.  Upper Abdomen:  Unremarkable.  Musculoskeletal/Soft Tissues: Old compression fracture of T10 with approximately 60% loss of anterior vertebral body height is unchanged. There is a new compression fracture of T12 with approximately 50% loss of anterior vertebral body height, which does not appear to be acute. There are no aggressive appearing lytic or blastic lesions noted in the visualized portions of the skeleton.  IMPRESSION: 1. Slow progressive enlargement of the aneurysmal segments of the thoracic aorta, as detailed above. This is most significant in the ascending thoracic aorta where the maximal diameter is currently 5.4 mm (previously 5.3 mm). The greatest increase in diameter has occurred in the isthmus, which currently measures 5.2 cm in diameter (previously 4.9 cm in diameter). 2. There are calcifications of the aortic valve. Echocardiographic correlation for evaluation of potential valvular dysfunction may be warranted if clinically indicated. 3. Left main and 3 vessel coronary artery disease again noted. 4. Mild diffuse bronchial thickening with moderate centrilobular and mild paraseptal emphysema; imaging findings suggestive of underlying COPD. 5. Additional incidental findings, as above.    Chronic respiratory failure Continue your oxygen at 3l/min Continue to wear your Trilogy every night.  Follow with Dr Delton Coombes in 3 months or sooner if you have any problems.  COPD (chronic obstructive pulmonary disease) Continue your stiolto daily, prednisone 20mg  daily Take albuterol  2 puffs up to every 4 hours if needed for shortness of breath.  Continue Duoneb as needed  Continue your oxygen at 3l/min Continue to wear your Trilogy every night. Levy Pupa.   Eriko Economos, MD, PhD 08/04/2016, 11:54 AM [facility] [phone] or if no answer [phone]

## 2016-08-04 NOTE — Assessment & Plan Note (Signed)
Continue your oxygen at 3l/min Continue to wear your Trilogy every night.  Follow with Dr Delton CoombesByrum in 3 months or sooner if you have any problems.

## 2016-08-04 NOTE — Addendum Note (Signed)
Addended by: Pamalee LeydenWIGGINS, Symeon Puleo J on: 08/04/2016 12:07 PM   Modules accepted: Orders

## 2016-08-04 NOTE — Assessment & Plan Note (Signed)
Continue your stiolto daily, prednisone 20mg  daily Take albuterol 2 puffs up to every 4 hours if needed for shortness of breath.  Continue Duoneb as needed  Continue your oxygen at 3l/min Continue to wear your Trilogy every night. .[name]

## 2016-08-05 DIAGNOSIS — E782 Mixed hyperlipidemia: Secondary | ICD-10-CM | POA: Diagnosis not present

## 2016-08-05 DIAGNOSIS — I5032 Chronic diastolic (congestive) heart failure: Secondary | ICD-10-CM | POA: Diagnosis not present

## 2016-08-05 DIAGNOSIS — E1142 Type 2 diabetes mellitus with diabetic polyneuropathy: Secondary | ICD-10-CM | POA: Diagnosis not present

## 2016-08-05 DIAGNOSIS — I10 Essential (primary) hypertension: Secondary | ICD-10-CM | POA: Diagnosis not present

## 2016-08-05 DIAGNOSIS — I110 Hypertensive heart disease with heart failure: Secondary | ICD-10-CM | POA: Diagnosis not present

## 2016-08-05 DIAGNOSIS — F17211 Nicotine dependence, cigarettes, in remission: Secondary | ICD-10-CM | POA: Diagnosis not present

## 2016-08-05 DIAGNOSIS — F331 Major depressive disorder, recurrent, moderate: Secondary | ICD-10-CM | POA: Diagnosis not present

## 2016-08-05 DIAGNOSIS — E034 Atrophy of thyroid (acquired): Secondary | ICD-10-CM | POA: Diagnosis not present

## 2016-08-05 DIAGNOSIS — J449 Chronic obstructive pulmonary disease, unspecified: Secondary | ICD-10-CM | POA: Diagnosis not present

## 2016-08-08 NOTE — Progress Notes (Signed)
HPI: FU thoracic aneurysm, severe COPD, CAD and paroxysmal atrial tachycardia. She had an echocardiogram in Nov 2010, that showed normal LV function, grade 1 diastolic dysfunction, aortic aneurysm, mild AI and mild to moderate MR. Last Myoview was performed in June of 2013. Her ejection fraction was 79% with apical thinning and no ischemia. Chest CT February 2017 showed thoracic aortic aneurysm measuring 5.4 cm. There is note of coronary calcification and COPD. She has elected conservative therapy with no FU CT as she would be a poor candidate for surgery. Since last seen, patient continues to have severe dyspnea on exertion. No orthopnea or PND. She has worsening pedal edema. No chest pain, palpitations or syncope.  Current Outpatient Prescriptions  Medication Sig Dispense Refill  . albuterol (PROVENTIL HFA;VENTOLIN HFA) 108 (90 Base) MCG/ACT inhaler Inhale 1-2 puffs into the lungs every 6 (six) hours as needed for wheezing or shortness of breath. 1 Inhaler 5  . amLODipine (NORVASC) 5 MG tablet Take 5 mg by mouth daily.    [name] aspirin 81 MG tablet Take 81 mg by mouth daily.      . Calcium Carbonate (CALCIUM 500 PO) Take 1 capsule by mouth daily. Reported on 12/22/2015    . calcium carbonate (OS-CAL) 600 MG TABS tablet Take 600 mg by mouth daily with breakfast. Reported on 11/25/2015    . Cholecalciferol (VITAMIN D) 1000 UNITS capsule Take 1,000 Units by mouth daily.      . clonazePAM (KLONOPIN) 1 MG tablet Take 1 mg by mouth daily as needed for anxiety. Take one tablet daily as needed    . diphenhydrAMINE (SOMINEX) 25 MG tablet Take 25 mg by mouth at bedtime as needed for sleep. Reported on 11/25/2015    . fluticasone (FLONASE) 50 MCG/ACT nasal spray Place 2 sprays into both nostrils daily. 16 g 5  . furosemide (LASIX) 80 MG tablet Take 80 mg by mouth. Take one tablet  In the morning and half tab in the evening    . gabapentin (NEURONTIN) 300 MG capsule Take 300 mg by mouth 3 (three) times  daily.     [name] ipratropium-albuterol (DUONEB) 0.5-2.5 (3) MG/3ML SOLN Take 3 mLs by nebulization every 4 (four) hours as needed.     . Iron-FA-B Cmp-C-Biot-Probiotic (FUSION PLUS) CAPS Take 1 capsule by mouth daily.    [name] levothyroxine (SYNTHROID, LEVOTHROID) 150 MCG tablet Take 150 mcg by mouth daily before breakfast.     . losartan (COZAAR) 100 MG tablet Take 100 mg by mouth daily.    . magnesium oxide (MAG-OX) 400 MG tablet Take 400 mg by mouth daily.      . metFORMIN (GLUCOPHAGE-XR) 500 MG 24 hr tablet 1 tablet daily.    . montelukast (SINGULAIR) 10 MG tablet Take 1 tablet (10 mg total) by mouth at bedtime. 30 tablet 5  . omeprazole (PRILOSEC) 20 MG capsule Take 1 capsule (20 mg total) by mouth daily. 30 capsule 6  . Oxycodone-Acetaminophen (PERCOCET PO) Take 5-325 mg by mouth every 8 (eight) hours as needed.     . pravastatin (PRAVACHOL) 40 MG tablet TAKE 1 TABLET ONCE DAILY. 30 tablet 11  . predniSONE (DELTASONE) 20 MG tablet Take 1 tablet (20 mg total) by mouth daily. 30 tablet 5  . sertraline (ZOLOFT) 50 MG tablet Take 50 mg by mouth 2 (two) times daily. Reported on 09/07/2015    . Tiotropium Bromide-Olodaterol (STIOLTO RESPIMAT) 2.5-2.5 MCG/ACT AERS Inhale 2 puffs into the lungs daily. 2 Inhaler 0  No current facility-administered medications for this visit.      Past Medical History:  Diagnosis Date  . Allergy   . Anemia   . Anxiety   . Aortic aneurysm (HCC)   . Arthritis   . Blood transfusion without reported diagnosis   . CAD (coronary artery disease)   . Cataract   . COPD (chronic obstructive pulmonary disease) (HCC)   . Depression   . Diabetes mellitus without complication (HCC)   . Hypertension   . Hypothyroid   . MVP (mitral valve prolapse)   . Pulmonary nodule   . Supraventricular tachycardia (HCC)   . Varicose vein     Past Surgical History:  Procedure Laterality Date  . APPENDECTOMY  1956  . BREAST SURGERY     benign left breast cyst removed  . EYE SURGERY     . TOTAL ABDOMINAL HYSTERECTOMY  1975  . TUBAL LIGATION  1972    Social History   Social History  . Marital status: Married    Spouse name: N/A  . Number of children: N/A  . Years of education: N/A   Occupational History  . LPN    Social History Main Topics  . Smoking status: Former Smoker    Packs/day: 1.00    Years: 50.00    Types: Cigarettes    Quit date: 07/05/1999  . Smokeless tobacco: Never Used  . Alcohol use No  . Drug use: No  . Sexual activity: No   Other Topics Concern  . Not on file   Social History Narrative   Pt is married and lives with her husband. She works as an Public house managerLPN.  She has not had any known TB exposures.  She is a former smoker.  Quit in 2001.  She has approx 50 pack year total history.  Does not use alcohol     Family History  Problem Relation Age of Onset  . Heart disease Father   . Heart disease Son   . Heart disease Mother   . Heart disease Other     grandfather     ROS: no fevers or chills, productive cough, hemoptysis, dysphasia, odynophagia, melena, hematochezia, dysuria, hematuria, rash, seizure activity, orthopnea, PND, claudication. Remaining systems are negative.  Physical Exam: Well-developed chronically ill appearing in no acute distress.  Skin is warm and dry.  HEENT is normal.  Neck is supple.  Chest diminished BS throughout Cardiovascular exam is regular rate and rhythm.  Abdominal exam nontender or distended. No masses palpated. Extremities show 1+ edema. neuro grossly intact  ECG-Sinus tachycardia with no ST changes.  A/P  1 Coronary artery disease-continue aspirin and statin.  2 hyperlipidemia-continue statin. Lipids and liver monitored by primary care.   3 hypertension-blood pressure controlled. Continue present medications.  4 supraventricular tachycardia-no recent episodes.  5 thoracic aortic aneurysm-patient is followed by cardiothoracic surgery. However she is not a good candidate for aortic root  replacement given comorbidities including home O2 dependent severe COPD. I would therefore favor not pursuing follow-up CT scans. We need aggressive BP control.  6 edema-she has worsening pedal edema. This is likely secondary to pulmonary hypertension and right heart failure given severity of COPD. Increase Lasix to 80 mg twice a day. Check potassium and renal function in 1 week. Schedule follow-up echocardiogram.   Olga MillersBrian Dalon Reichart, MD

## 2016-08-11 ENCOUNTER — Other Ambulatory Visit: Payer: Self-pay

## 2016-08-11 VITALS — Ht 65.0 in | Wt 188.0 lb

## 2016-08-11 DIAGNOSIS — J441 Chronic obstructive pulmonary disease with (acute) exacerbation: Secondary | ICD-10-CM

## 2016-08-11 NOTE — Patient Outreach (Signed)
[facility] Care Management  08/11/2016  Morgan LeepJudy [facility] 01/12/1941 [id number]  Telephone contact with patient.  She reports no changes in COPD.  She saw Dr. Delton CoombesByrum on 08-04-16, and saw her PCP yesterday, and has a cardiology appointment (Dr. Jens Somrenshaw) scheduled for tomorrow.  Patient uses O2 at 3L/min continuously.  Complains of on-going edema in lower extremities, although her weight is down to 188 pounds today, down from 194 at Dr. Kavin LeechByrum's office on the 1st.  Plan:  Patient will keep MD appointments.           Patient will continue to use COPD Action Plan.           Patient will practice fall prevention strategies.           RN will mail EMMI low salt dietary guidelines.           RN will follow up in March.  Tyler Deisonnie Nairi Oswald, RN, MSN RN [employer] Coach [facility] [phone] Fax [phone]

## 2016-08-12 ENCOUNTER — Ambulatory Visit (INDEPENDENT_AMBULATORY_CARE_PROVIDER_SITE_OTHER): Payer: Medicare Other | Admitting: Cardiology

## 2016-08-12 ENCOUNTER — Encounter: Payer: Self-pay | Admitting: Cardiology

## 2016-08-12 VITALS — BP 140/78 | HR 106 | Ht 65.0 in | Wt 197.0 lb

## 2016-08-12 DIAGNOSIS — I712 Thoracic aortic aneurysm, without rupture, unspecified: Secondary | ICD-10-CM

## 2016-08-12 DIAGNOSIS — R0600 Dyspnea, unspecified: Secondary | ICD-10-CM

## 2016-08-12 MED ORDER — FUROSEMIDE 80 MG PO TABS: 80.0000 mg | ORAL_TABLET | Freq: Two times a day (BID) | ORAL | 3 refills | Status: AC

## 2016-08-12 NOTE — Patient Instructions (Signed)
Medication Instructions:   INCREASE FUROSEMIDE TO 80 MG TWICE DAILY  Labwork:  Your physician recommends that you return for lab work in: ONE WEEK  Testing/Procedures:  Your physician has requested that you have an echocardiogram. Echocardiography is a painless test that uses sound waves to create images of your heart. It provides your doctor with information about the size and shape of your heart and how well your heart's chambers and valves are working. This procedure takes approximately one hour. There are no restrictions for this procedure.    Follow-Up:  Your physician recommends that you schedule a follow-up appointment in: 3 MONTHS WITH DR Jens SomRENSHAW

## 2016-08-15 NOTE — Addendum Note (Signed)
Addended by: Kandice RobinsonsYOUNG, Remy Voiles T on: 08/15/2016 02:58 PM   Modules accepted: Orders

## 2016-08-19 ENCOUNTER — Other Ambulatory Visit: Payer: Self-pay | Admitting: Cardiology

## 2016-08-19 DIAGNOSIS — R0600 Dyspnea, unspecified: Secondary | ICD-10-CM | POA: Diagnosis not present

## 2016-08-20 LAB — BASIC METABOLIC PANEL
BUN/Creatinine Ratio: 27 (ref 12–28)
BUN: 34 mg/dL — AB (ref 8–27)
CALCIUM: 10.2 mg/dL (ref 8.7–10.3)
CHLORIDE: 91 mmol/L — AB (ref 96–106)
CO2: 33 mmol/L — AB (ref 18–29)
CREATININE: 1.26 mg/dL — AB (ref 0.57–1.00)
GFR calc Af Amer: 49 mL/min/{1.73_m2} — ABNORMAL LOW (ref >59)
GFR calc non Af Amer: 42 mL/min/{1.73_m2} — ABNORMAL LOW (ref >59)
GLUCOSE: 197 mg/dL — AB (ref 65–99)
Potassium: 3.6 mmol/L (ref 3.5–5.2)
Sodium: 141 mmol/L (ref 134–144)

## 2016-08-25 ENCOUNTER — Ambulatory Visit: Payer: Medicare Other | Admitting: Cardiothoracic Surgery

## 2016-09-05 DIAGNOSIS — G894 Chronic pain syndrome: Secondary | ICD-10-CM | POA: Diagnosis present

## 2016-09-05 DIAGNOSIS — Z79899 Other long term (current) drug therapy: Secondary | ICD-10-CM | POA: Diagnosis not present

## 2016-09-05 DIAGNOSIS — R6520 Severe sepsis without septic shock: Secondary | ICD-10-CM | POA: Diagnosis present

## 2016-09-05 DIAGNOSIS — I10 Essential (primary) hypertension: Secondary | ICD-10-CM | POA: Diagnosis not present

## 2016-09-05 DIAGNOSIS — J440 Chronic obstructive pulmonary disease with acute lower respiratory infection: Secondary | ICD-10-CM | POA: Diagnosis present

## 2016-09-05 DIAGNOSIS — F418 Other specified anxiety disorders: Secondary | ICD-10-CM | POA: Diagnosis present

## 2016-09-05 DIAGNOSIS — E872 Acidosis: Secondary | ICD-10-CM | POA: Diagnosis not present

## 2016-09-05 DIAGNOSIS — M1990 Unspecified osteoarthritis, unspecified site: Secondary | ICD-10-CM | POA: Diagnosis present

## 2016-09-05 DIAGNOSIS — D649 Anemia, unspecified: Secondary | ICD-10-CM | POA: Diagnosis not present

## 2016-09-05 DIAGNOSIS — E669 Obesity, unspecified: Secondary | ICD-10-CM | POA: Diagnosis not present

## 2016-09-05 DIAGNOSIS — I129 Hypertensive chronic kidney disease with stage 1 through stage 4 chronic kidney disease, or unspecified chronic kidney disease: Secondary | ICD-10-CM | POA: Diagnosis present

## 2016-09-05 DIAGNOSIS — G8929 Other chronic pain: Secondary | ICD-10-CM | POA: Diagnosis not present

## 2016-09-05 DIAGNOSIS — Z888 Allergy status to other drugs, medicaments and biological substances status: Secondary | ICD-10-CM | POA: Diagnosis not present

## 2016-09-05 DIAGNOSIS — J9621 Acute and chronic respiratory failure with hypoxia: Secondary | ICD-10-CM | POA: Diagnosis not present

## 2016-09-05 DIAGNOSIS — A419 Sepsis, unspecified organism: Secondary | ICD-10-CM | POA: Diagnosis not present

## 2016-09-05 DIAGNOSIS — Z7982 Long term (current) use of aspirin: Secondary | ICD-10-CM | POA: Diagnosis not present

## 2016-09-05 DIAGNOSIS — I712 Thoracic aortic aneurysm, without rupture: Secondary | ICD-10-CM | POA: Diagnosis not present

## 2016-09-05 DIAGNOSIS — Z932 Ileostomy status: Secondary | ICD-10-CM | POA: Diagnosis not present

## 2016-09-05 DIAGNOSIS — N189 Chronic kidney disease, unspecified: Secondary | ICD-10-CM | POA: Diagnosis not present

## 2016-09-05 DIAGNOSIS — E7800 Pure hypercholesterolemia, unspecified: Secondary | ICD-10-CM | POA: Diagnosis present

## 2016-09-05 DIAGNOSIS — E119 Type 2 diabetes mellitus without complications: Secondary | ICD-10-CM | POA: Diagnosis not present

## 2016-09-05 DIAGNOSIS — E039 Hypothyroidism, unspecified: Secondary | ICD-10-CM | POA: Diagnosis not present

## 2016-09-05 DIAGNOSIS — N183 Chronic kidney disease, stage 3 (moderate): Secondary | ICD-10-CM | POA: Diagnosis present

## 2016-09-05 DIAGNOSIS — Z881 Allergy status to other antibiotic agents status: Secondary | ICD-10-CM | POA: Diagnosis not present

## 2016-09-05 DIAGNOSIS — J9612 Chronic respiratory failure with hypercapnia: Secondary | ICD-10-CM | POA: Diagnosis not present

## 2016-09-05 DIAGNOSIS — J441 Chronic obstructive pulmonary disease with (acute) exacerbation: Secondary | ICD-10-CM | POA: Diagnosis not present

## 2016-09-05 DIAGNOSIS — Z9981 Dependence on supplemental oxygen: Secondary | ICD-10-CM | POA: Diagnosis not present

## 2016-09-05 DIAGNOSIS — J9600 Acute respiratory failure, unspecified whether with hypoxia or hypercapnia: Secondary | ICD-10-CM | POA: Diagnosis not present

## 2016-09-05 DIAGNOSIS — E1122 Type 2 diabetes mellitus with diabetic chronic kidney disease: Secondary | ICD-10-CM | POA: Diagnosis not present

## 2016-09-05 DIAGNOSIS — R531 Weakness: Secondary | ICD-10-CM | POA: Diagnosis not present

## 2016-09-05 DIAGNOSIS — R0602 Shortness of breath: Secondary | ICD-10-CM | POA: Diagnosis not present

## 2016-09-05 DIAGNOSIS — J159 Unspecified bacterial pneumonia: Secondary | ICD-10-CM | POA: Diagnosis not present

## 2016-09-05 DIAGNOSIS — Z79891 Long term (current) use of opiate analgesic: Secondary | ICD-10-CM | POA: Diagnosis not present

## 2016-09-06 DIAGNOSIS — D649 Anemia, unspecified: Secondary | ICD-10-CM

## 2016-09-07 DIAGNOSIS — J159 Unspecified bacterial pneumonia: Secondary | ICD-10-CM

## 2016-09-07 DIAGNOSIS — J441 Chronic obstructive pulmonary disease with (acute) exacerbation: Secondary | ICD-10-CM

## 2016-09-07 DIAGNOSIS — G8929 Other chronic pain: Secondary | ICD-10-CM

## 2016-09-07 DIAGNOSIS — D649 Anemia, unspecified: Secondary | ICD-10-CM

## 2016-09-07 DIAGNOSIS — A419 Sepsis, unspecified organism: Secondary | ICD-10-CM

## 2016-09-07 DIAGNOSIS — E669 Obesity, unspecified: Secondary | ICD-10-CM

## 2016-09-07 DIAGNOSIS — J9612 Chronic respiratory failure with hypercapnia: Secondary | ICD-10-CM

## 2016-09-07 DIAGNOSIS — J9621 Acute and chronic respiratory failure with hypoxia: Secondary | ICD-10-CM

## 2016-09-08 ENCOUNTER — Other Ambulatory Visit: Payer: Self-pay

## 2016-09-08 NOTE — Patient Outreach (Signed)
[facility]) Care Management  09/08/2016  Morgan LeepJudy [facility] 07/28/1940 [id number]   Unsuccessful attempt to reach patient by phone.  No answer- unable to leave message. RN will make another attempt to reach patient within 10 days.  Tyler Deisonnie [name], RN, MSN RN [employer] Coach [facility] [phone] Fax [phone]

## 2016-09-09 ENCOUNTER — Other Ambulatory Visit (HOSPITAL_COMMUNITY): Payer: Medicare Other

## 2016-09-09 DIAGNOSIS — J440 Chronic obstructive pulmonary disease with acute lower respiratory infection: Secondary | ICD-10-CM | POA: Diagnosis not present

## 2016-09-09 DIAGNOSIS — Z932 Ileostomy status: Secondary | ICD-10-CM | POA: Diagnosis not present

## 2016-09-09 DIAGNOSIS — N183 Chronic kidney disease, stage 3 (moderate): Secondary | ICD-10-CM | POA: Diagnosis not present

## 2016-09-09 DIAGNOSIS — Z794 Long term (current) use of insulin: Secondary | ICD-10-CM | POA: Diagnosis not present

## 2016-09-09 DIAGNOSIS — J9622 Acute and chronic respiratory failure with hypercapnia: Secondary | ICD-10-CM | POA: Diagnosis not present

## 2016-09-09 DIAGNOSIS — I130 Hypertensive heart and chronic kidney disease with heart failure and stage 1 through stage 4 chronic kidney disease, or unspecified chronic kidney disease: Secondary | ICD-10-CM | POA: Diagnosis not present

## 2016-09-09 DIAGNOSIS — J9621 Acute and chronic respiratory failure with hypoxia: Secondary | ICD-10-CM | POA: Diagnosis not present

## 2016-09-09 DIAGNOSIS — Z9981 Dependence on supplemental oxygen: Secondary | ICD-10-CM | POA: Diagnosis not present

## 2016-09-09 DIAGNOSIS — Z79891 Long term (current) use of opiate analgesic: Secondary | ICD-10-CM | POA: Diagnosis not present

## 2016-09-09 DIAGNOSIS — J159 Unspecified bacterial pneumonia: Secondary | ICD-10-CM | POA: Diagnosis not present

## 2016-09-09 DIAGNOSIS — E1122 Type 2 diabetes mellitus with diabetic chronic kidney disease: Secondary | ICD-10-CM | POA: Diagnosis not present

## 2016-09-09 DIAGNOSIS — E1165 Type 2 diabetes mellitus with hyperglycemia: Secondary | ICD-10-CM | POA: Diagnosis not present

## 2016-09-09 DIAGNOSIS — F331 Major depressive disorder, recurrent, moderate: Secondary | ICD-10-CM | POA: Diagnosis not present

## 2016-09-09 DIAGNOSIS — J441 Chronic obstructive pulmonary disease with (acute) exacerbation: Secondary | ICD-10-CM | POA: Diagnosis not present

## 2016-09-09 DIAGNOSIS — A419 Sepsis, unspecified organism: Secondary | ICD-10-CM | POA: Diagnosis not present

## 2016-09-09 DIAGNOSIS — Z87891 Personal history of nicotine dependence: Secondary | ICD-10-CM | POA: Diagnosis not present

## 2016-09-09 DIAGNOSIS — E1142 Type 2 diabetes mellitus with diabetic polyneuropathy: Secondary | ICD-10-CM | POA: Diagnosis not present

## 2016-09-09 DIAGNOSIS — I5032 Chronic diastolic (congestive) heart failure: Secondary | ICD-10-CM | POA: Diagnosis not present

## 2016-09-12 DIAGNOSIS — J9621 Acute and chronic respiratory failure with hypoxia: Secondary | ICD-10-CM | POA: Diagnosis not present

## 2016-09-12 DIAGNOSIS — I5032 Chronic diastolic (congestive) heart failure: Secondary | ICD-10-CM | POA: Diagnosis not present

## 2016-09-12 DIAGNOSIS — A419 Sepsis, unspecified organism: Secondary | ICD-10-CM | POA: Diagnosis not present

## 2016-09-12 DIAGNOSIS — E1122 Type 2 diabetes mellitus with diabetic chronic kidney disease: Secondary | ICD-10-CM | POA: Diagnosis not present

## 2016-09-12 DIAGNOSIS — J9622 Acute and chronic respiratory failure with hypercapnia: Secondary | ICD-10-CM | POA: Diagnosis not present

## 2016-09-12 DIAGNOSIS — I130 Hypertensive heart and chronic kidney disease with heart failure and stage 1 through stage 4 chronic kidney disease, or unspecified chronic kidney disease: Secondary | ICD-10-CM | POA: Diagnosis not present

## 2016-09-13 DIAGNOSIS — J9621 Acute and chronic respiratory failure with hypoxia: Secondary | ICD-10-CM | POA: Diagnosis not present

## 2016-09-13 DIAGNOSIS — I130 Hypertensive heart and chronic kidney disease with heart failure and stage 1 through stage 4 chronic kidney disease, or unspecified chronic kidney disease: Secondary | ICD-10-CM | POA: Diagnosis not present

## 2016-09-13 DIAGNOSIS — I5032 Chronic diastolic (congestive) heart failure: Secondary | ICD-10-CM | POA: Diagnosis not present

## 2016-09-13 DIAGNOSIS — J9622 Acute and chronic respiratory failure with hypercapnia: Secondary | ICD-10-CM | POA: Diagnosis not present

## 2016-09-13 DIAGNOSIS — A419 Sepsis, unspecified organism: Secondary | ICD-10-CM | POA: Diagnosis not present

## 2016-09-13 DIAGNOSIS — E1122 Type 2 diabetes mellitus with diabetic chronic kidney disease: Secondary | ICD-10-CM | POA: Diagnosis not present

## 2016-09-14 DIAGNOSIS — J9621 Acute and chronic respiratory failure with hypoxia: Secondary | ICD-10-CM | POA: Diagnosis not present

## 2016-09-14 DIAGNOSIS — J9622 Acute and chronic respiratory failure with hypercapnia: Secondary | ICD-10-CM | POA: Diagnosis not present

## 2016-09-14 DIAGNOSIS — E1122 Type 2 diabetes mellitus with diabetic chronic kidney disease: Secondary | ICD-10-CM | POA: Diagnosis not present

## 2016-09-14 DIAGNOSIS — A419 Sepsis, unspecified organism: Secondary | ICD-10-CM | POA: Diagnosis not present

## 2016-09-14 DIAGNOSIS — I5032 Chronic diastolic (congestive) heart failure: Secondary | ICD-10-CM | POA: Diagnosis not present

## 2016-09-14 DIAGNOSIS — I130 Hypertensive heart and chronic kidney disease with heart failure and stage 1 through stage 4 chronic kidney disease, or unspecified chronic kidney disease: Secondary | ICD-10-CM | POA: Diagnosis not present

## 2016-09-15 ENCOUNTER — Other Ambulatory Visit: Payer: Self-pay

## 2016-09-15 VITALS — Wt 187.0 lb

## 2016-09-15 NOTE — Patient Outreach (Signed)
[facility]) Care Management  09/15/2016  Humberto LeepJudy Damico 11/14/1940 [id number]   Telephone call to patient.  She reports she was admitted to the hospital on March 5th thru the 9th with pneumonia.  [facility]  She reports she just completed the antibiotic she was discharged home with., and that she has an appointment tomorrow with her PCP Gus HeightAndrea Johnson.  She reports feeling weak, but that she is eating well and drinking plenty of fluids.  She reports her weight is 187 today, but that it increased while in the hospital due to steroids.  Educated re fall precautions.  Explained TOC services to patient.  She is in agreement with being referred for TOC.  Plan:  Patient will keep PCP appointment tomorrow.           Patient will take meds as ordered.           [facility].  Tyler Deisonnie Lilyana Lippman, RN, MSN RN [employer] [facility] [phone] Fax [phone]

## 2016-09-16 ENCOUNTER — Other Ambulatory Visit: Payer: Self-pay

## 2016-09-16 DIAGNOSIS — I5032 Chronic diastolic (congestive) heart failure: Secondary | ICD-10-CM | POA: Diagnosis not present

## 2016-09-16 DIAGNOSIS — J9621 Acute and chronic respiratory failure with hypoxia: Secondary | ICD-10-CM | POA: Diagnosis not present

## 2016-09-16 DIAGNOSIS — J158 Pneumonia due to other specified bacteria: Secondary | ICD-10-CM | POA: Diagnosis not present

## 2016-09-16 DIAGNOSIS — E1122 Type 2 diabetes mellitus with diabetic chronic kidney disease: Secondary | ICD-10-CM | POA: Diagnosis not present

## 2016-09-16 DIAGNOSIS — J441 Chronic obstructive pulmonary disease with (acute) exacerbation: Secondary | ICD-10-CM | POA: Diagnosis not present

## 2016-09-16 DIAGNOSIS — I130 Hypertensive heart and chronic kidney disease with heart failure and stage 1 through stage 4 chronic kidney disease, or unspecified chronic kidney disease: Secondary | ICD-10-CM | POA: Diagnosis not present

## 2016-09-16 DIAGNOSIS — J9622 Acute and chronic respiratory failure with hypercapnia: Secondary | ICD-10-CM | POA: Diagnosis not present

## 2016-09-16 DIAGNOSIS — A419 Sepsis, unspecified organism: Secondary | ICD-10-CM | POA: Diagnosis not present

## 2016-09-16 DIAGNOSIS — J440 Chronic obstructive pulmonary disease with acute lower respiratory infection: Secondary | ICD-10-CM | POA: Diagnosis not present

## 2016-09-16 NOTE — Patient Outreach (Signed)
Transition of care: New referral received on 09/16/2016. Discharged date per previous note on 09/09/2016  Placed call to patient and explained reason for call.  Patient reports that she does not feel good. Reports feeling weak. Reports that she saw her primary MD today.  Offered to review medications with patient and she reports that she does not feel well and does not feel like talking. Patient states that she is short of breath but its her " normal " shortness of breath. Reports that she is wearing her oxygen as prescribed.   Reviewed with patient that I work with Ander PurpuraKim Glover ( patients previous case manager).  Patient reports that she would like to work with Selena BattenKim.  Also reports that she would like a call back at the beginning of the week as she really does not feel like talking.  PLAN: Patient will receive weekly outreach attempts. Will discuss case with previous case manager per patients request. Will contact patient next week per patients request.  Will send MD barrier letter.  No care plan completed at this time due to unable to discuss goals with patient.  Will create care plan at next telephone contact.  Rowe PavyAmanda Charissa Knowles, RN, BSN, CEN [facility] Care Coordinator [phone]

## 2016-09-18 DIAGNOSIS — M1990 Unspecified osteoarthritis, unspecified site: Secondary | ICD-10-CM | POA: Diagnosis present

## 2016-09-18 DIAGNOSIS — E119 Type 2 diabetes mellitus without complications: Secondary | ICD-10-CM | POA: Diagnosis not present

## 2016-09-18 DIAGNOSIS — J9621 Acute and chronic respiratory failure with hypoxia: Secondary | ICD-10-CM | POA: Diagnosis not present

## 2016-09-18 DIAGNOSIS — J440 Chronic obstructive pulmonary disease with acute lower respiratory infection: Secondary | ICD-10-CM | POA: Diagnosis present

## 2016-09-18 DIAGNOSIS — Z888 Allergy status to other drugs, medicaments and biological substances status: Secondary | ICD-10-CM | POA: Diagnosis not present

## 2016-09-18 DIAGNOSIS — E669 Obesity, unspecified: Secondary | ICD-10-CM | POA: Diagnosis not present

## 2016-09-18 DIAGNOSIS — Z7982 Long term (current) use of aspirin: Secondary | ICD-10-CM | POA: Diagnosis not present

## 2016-09-18 DIAGNOSIS — Z8701 Personal history of pneumonia (recurrent): Secondary | ICD-10-CM | POA: Diagnosis not present

## 2016-09-18 DIAGNOSIS — J441 Chronic obstructive pulmonary disease with (acute) exacerbation: Secondary | ICD-10-CM | POA: Diagnosis not present

## 2016-09-18 DIAGNOSIS — E034 Atrophy of thyroid (acquired): Secondary | ICD-10-CM | POA: Diagnosis present

## 2016-09-18 DIAGNOSIS — E860 Dehydration: Secondary | ICD-10-CM | POA: Diagnosis present

## 2016-09-18 DIAGNOSIS — I110 Hypertensive heart disease with heart failure: Secondary | ICD-10-CM | POA: Diagnosis not present

## 2016-09-18 DIAGNOSIS — E039 Hypothyroidism, unspecified: Secondary | ICD-10-CM | POA: Diagnosis not present

## 2016-09-18 DIAGNOSIS — I712 Thoracic aortic aneurysm, without rupture: Secondary | ICD-10-CM | POA: Diagnosis not present

## 2016-09-18 DIAGNOSIS — F418 Other specified anxiety disorders: Secondary | ICD-10-CM | POA: Diagnosis present

## 2016-09-18 DIAGNOSIS — N179 Acute kidney failure, unspecified: Secondary | ICD-10-CM | POA: Diagnosis not present

## 2016-09-18 DIAGNOSIS — Z9981 Dependence on supplemental oxygen: Secondary | ICD-10-CM | POA: Diagnosis not present

## 2016-09-18 DIAGNOSIS — R0602 Shortness of breath: Secondary | ICD-10-CM | POA: Diagnosis not present

## 2016-09-18 DIAGNOSIS — J181 Lobar pneumonia, unspecified organism: Secondary | ICD-10-CM | POA: Diagnosis not present

## 2016-09-18 DIAGNOSIS — E7800 Pure hypercholesterolemia, unspecified: Secondary | ICD-10-CM | POA: Diagnosis present

## 2016-09-18 DIAGNOSIS — I509 Heart failure, unspecified: Secondary | ICD-10-CM | POA: Diagnosis not present

## 2016-09-18 DIAGNOSIS — Z79899 Other long term (current) drug therapy: Secondary | ICD-10-CM | POA: Diagnosis not present

## 2016-09-18 DIAGNOSIS — N289 Disorder of kidney and ureter, unspecified: Secondary | ICD-10-CM | POA: Diagnosis not present

## 2016-09-18 DIAGNOSIS — Z881 Allergy status to other antibiotic agents status: Secondary | ICD-10-CM | POA: Diagnosis not present

## 2016-09-18 DIAGNOSIS — Z87891 Personal history of nicotine dependence: Secondary | ICD-10-CM | POA: Diagnosis not present

## 2016-09-19 ENCOUNTER — Other Ambulatory Visit: Payer: Self-pay | Admitting: *Deleted

## 2016-09-19 DIAGNOSIS — N179 Acute kidney failure, unspecified: Secondary | ICD-10-CM

## 2016-09-19 DIAGNOSIS — E119 Type 2 diabetes mellitus without complications: Secondary | ICD-10-CM

## 2016-09-19 DIAGNOSIS — J9621 Acute and chronic respiratory failure with hypoxia: Secondary | ICD-10-CM

## 2016-09-19 DIAGNOSIS — E669 Obesity, unspecified: Secondary | ICD-10-CM

## 2016-09-19 DIAGNOSIS — J181 Lobar pneumonia, unspecified organism: Secondary | ICD-10-CM

## 2016-09-19 DIAGNOSIS — I712 Thoracic aortic aneurysm, without rupture: Secondary | ICD-10-CM

## 2016-09-19 DIAGNOSIS — J441 Chronic obstructive pulmonary disease with (acute) exacerbation: Secondary | ICD-10-CM

## 2016-09-19 DIAGNOSIS — E039 Hypothyroidism, unspecified: Secondary | ICD-10-CM

## 2016-09-19 NOTE — Patient Outreach (Signed)
[facility]) Care Management  09/19/2016  Humberto LeepJudy Rotundo 09/22/1940 [id number]   Transition of care  Referral received on 09/06/16 Discharged date : 09/09/16 ( as noted by Tyler Deisonnie Rankin , telephonic health coach  Contacted on 3/16 by Rowe PavyAmanda Cook, [facility] care manager, for initial contact for transition of care.   Placed call to patient no answer unable to leave a telephone message.  Reviewed electronic record, noted [facility] with COPD exacerbation.   Plan Will continue to follow progress toward discharge and contact  patient for transition of care at discharge.   Egbert GaribaldiKimberly Sydnei Ohaver, RN, [facility] Care Management [phone] Mobile [phone]- Toll Free Main Office

## 2016-09-22 ENCOUNTER — Other Ambulatory Visit: Payer: Self-pay | Admitting: *Deleted

## 2016-09-22 DIAGNOSIS — J9622 Acute and chronic respiratory failure with hypercapnia: Secondary | ICD-10-CM | POA: Diagnosis not present

## 2016-09-22 DIAGNOSIS — E1122 Type 2 diabetes mellitus with diabetic chronic kidney disease: Secondary | ICD-10-CM | POA: Diagnosis not present

## 2016-09-22 DIAGNOSIS — J9621 Acute and chronic respiratory failure with hypoxia: Secondary | ICD-10-CM | POA: Diagnosis not present

## 2016-09-22 DIAGNOSIS — A419 Sepsis, unspecified organism: Secondary | ICD-10-CM | POA: Diagnosis not present

## 2016-09-22 DIAGNOSIS — I5032 Chronic diastolic (congestive) heart failure: Secondary | ICD-10-CM | POA: Diagnosis not present

## 2016-09-22 DIAGNOSIS — I130 Hypertensive heart and chronic kidney disease with heart failure and stage 1 through stage 4 chronic kidney disease, or unspecified chronic kidney disease: Secondary | ICD-10-CM | POA: Diagnosis not present

## 2016-09-22 NOTE — Patient Outreach (Signed)
[facility]) Care Management  09/22/2016  Morgan LeepJudy [facility] 01/16/1941 [id number]  Transition of care call  [facility] with [facility] exacerbation . Discharged on 09/21/16  Spoke with patient reports she is feeling weak and worn out , but slept like a log on last night. Explained reason for call and transition of care program.  Patient reports her breathing is at her baseline she is wearing her oxygen at 3 liters as prescribed. Patient was recently discharged from [facility] and all medications have been reviewed. Patient discussed her weight today is 188 lbs and swellings is decreasing in her legs. Patient reports her appetite is coming back and her blood sugar was 96 this morning.  [place] home health RN has visited patient on today.    Plan Will provide weekly outreach calls as part of transition of care next scheduled call in a week and will schedule home visit for the next week.  Patient will notify MD for unrelieved symptoms and 911 for emergency .  [facility] CM Care Plan Problem One     Most Recent Value  Care Plan Problem One  Recent hospitalization related to COPD exacerbation   Role Documenting the Problem One  Care Management Coordinator  Care Plan for Problem One  Active  [facility] Long Term Goal (31-90 days)  Patient will not experience a [facility] admission in the next 60 days   Interventions for Problem One Long Term Goal  Reviewed discharge instructions, advised regarding the importance of taking medications as prescribed, and notifying MD of worse symptoms   [facility] CM Short Term Goal #1 (0-30 days)  Patient will attend PCP visit in the next 14 days   [facility] CM Short Term Goal #1 Start Date  09/22/16  Interventions for Short Term Goal #1  Discussed the importance of PCP follow up after discharge, encouraged to take medicaiton for review   [facility] CM Short Term Goal #2 (0-30 days)  Patient will be able to state worsening respiratory symptoms to notify  MD of sooner in the next 30 days   [facility] CM Short Term Goal #2 Start Date  09/22/16  Interventions for Short Term Goal #2  Discussed symptoms of worsening shortness of breath, weakness, cough, fever to notify MD sooner   [facility] CM Short Term Goal #3 (0-30 days)  Patient will continue to weigh daily and keep a record in the next 30 days   [facility] CM Short Term Goal #3 Start Date  09/22/16  Interventions for Short Tern Goal #3  Encouraged to continue to weigh daily and keep a record.        Egbert GaribaldiKimberly Gizel Riedlinger, RN, [facility] Care Management [phone]- Mobile [phone]- Toll Free Main Office

## 2016-09-23 ENCOUNTER — Other Ambulatory Visit (HOSPITAL_COMMUNITY): Payer: Medicare Other

## 2016-09-23 DIAGNOSIS — J9621 Acute and chronic respiratory failure with hypoxia: Secondary | ICD-10-CM | POA: Diagnosis not present

## 2016-09-23 DIAGNOSIS — A419 Sepsis, unspecified organism: Secondary | ICD-10-CM | POA: Diagnosis not present

## 2016-09-23 DIAGNOSIS — E1122 Type 2 diabetes mellitus with diabetic chronic kidney disease: Secondary | ICD-10-CM | POA: Diagnosis not present

## 2016-09-23 DIAGNOSIS — I130 Hypertensive heart and chronic kidney disease with heart failure and stage 1 through stage 4 chronic kidney disease, or unspecified chronic kidney disease: Secondary | ICD-10-CM | POA: Diagnosis not present

## 2016-09-23 DIAGNOSIS — I5032 Chronic diastolic (congestive) heart failure: Secondary | ICD-10-CM | POA: Diagnosis not present

## 2016-09-23 DIAGNOSIS — J9622 Acute and chronic respiratory failure with hypercapnia: Secondary | ICD-10-CM | POA: Diagnosis not present

## 2016-09-26 DIAGNOSIS — I5032 Chronic diastolic (congestive) heart failure: Secondary | ICD-10-CM | POA: Diagnosis not present

## 2016-09-26 DIAGNOSIS — J9621 Acute and chronic respiratory failure with hypoxia: Secondary | ICD-10-CM | POA: Diagnosis not present

## 2016-09-26 DIAGNOSIS — J9622 Acute and chronic respiratory failure with hypercapnia: Secondary | ICD-10-CM | POA: Diagnosis not present

## 2016-09-26 DIAGNOSIS — I130 Hypertensive heart and chronic kidney disease with heart failure and stage 1 through stage 4 chronic kidney disease, or unspecified chronic kidney disease: Secondary | ICD-10-CM | POA: Diagnosis not present

## 2016-09-26 DIAGNOSIS — E1122 Type 2 diabetes mellitus with diabetic chronic kidney disease: Secondary | ICD-10-CM | POA: Diagnosis not present

## 2016-09-26 DIAGNOSIS — A419 Sepsis, unspecified organism: Secondary | ICD-10-CM | POA: Diagnosis not present

## 2016-09-27 DIAGNOSIS — J9621 Acute and chronic respiratory failure with hypoxia: Secondary | ICD-10-CM | POA: Diagnosis not present

## 2016-09-27 DIAGNOSIS — I130 Hypertensive heart and chronic kidney disease with heart failure and stage 1 through stage 4 chronic kidney disease, or unspecified chronic kidney disease: Secondary | ICD-10-CM | POA: Diagnosis not present

## 2016-09-27 DIAGNOSIS — I5032 Chronic diastolic (congestive) heart failure: Secondary | ICD-10-CM | POA: Diagnosis not present

## 2016-09-27 DIAGNOSIS — E1122 Type 2 diabetes mellitus with diabetic chronic kidney disease: Secondary | ICD-10-CM | POA: Diagnosis not present

## 2016-09-27 DIAGNOSIS — A419 Sepsis, unspecified organism: Secondary | ICD-10-CM | POA: Diagnosis not present

## 2016-09-27 DIAGNOSIS — J9622 Acute and chronic respiratory failure with hypercapnia: Secondary | ICD-10-CM | POA: Diagnosis not present

## 2016-09-28 ENCOUNTER — Other Ambulatory Visit: Payer: Self-pay | Admitting: *Deleted

## 2016-09-28 DIAGNOSIS — E119 Type 2 diabetes mellitus without complications: Secondary | ICD-10-CM | POA: Diagnosis not present

## 2016-09-28 DIAGNOSIS — H26493 Other secondary cataract, bilateral: Secondary | ICD-10-CM | POA: Diagnosis not present

## 2016-09-28 NOTE — Patient Outreach (Signed)
[facility]) Care Management  09/28/2016  Humberto LeepJudy Leonhardt 07/14/1940 [id number]   Transition of care call  Spoke with patient reports she has been bothered by back pain, she notified her PCP on yesterday and prescribed tianizidine , but states it made her feel loopy so she stopped taking it.  Patient states she gets winded when up and about but is able to control breathing by rest and purse lip breathing. Denies increase in cough production and no fever.   Patient reports she has a  bath aide twice a week  and that helps her .Patient discussed she still has swelling in her lower legs no worse than earlier in week, weight at 191 .   Patient reports she has PCP visit on this week.    Plan Will plan on home visit in the next week as part of transition of care program.   Egbert GaribaldiKimberly Dezarae Mcclaran, RN, [facility] Care Management [phone]- Mobile [phone]- Toll Free Main Office

## 2016-09-29 DIAGNOSIS — I5032 Chronic diastolic (congestive) heart failure: Secondary | ICD-10-CM | POA: Diagnosis not present

## 2016-09-29 DIAGNOSIS — J9622 Acute and chronic respiratory failure with hypercapnia: Secondary | ICD-10-CM | POA: Diagnosis not present

## 2016-09-29 DIAGNOSIS — A419 Sepsis, unspecified organism: Secondary | ICD-10-CM | POA: Diagnosis not present

## 2016-09-29 DIAGNOSIS — J9621 Acute and chronic respiratory failure with hypoxia: Secondary | ICD-10-CM | POA: Diagnosis not present

## 2016-09-29 DIAGNOSIS — I130 Hypertensive heart and chronic kidney disease with heart failure and stage 1 through stage 4 chronic kidney disease, or unspecified chronic kidney disease: Secondary | ICD-10-CM | POA: Diagnosis not present

## 2016-09-29 DIAGNOSIS — E1122 Type 2 diabetes mellitus with diabetic chronic kidney disease: Secondary | ICD-10-CM | POA: Diagnosis not present

## 2016-09-30 DIAGNOSIS — J158 Pneumonia due to other specified bacteria: Secondary | ICD-10-CM | POA: Diagnosis not present

## 2016-09-30 DIAGNOSIS — R600 Localized edema: Secondary | ICD-10-CM | POA: Diagnosis not present

## 2016-10-03 DIAGNOSIS — A419 Sepsis, unspecified organism: Secondary | ICD-10-CM | POA: Diagnosis not present

## 2016-10-03 DIAGNOSIS — E1122 Type 2 diabetes mellitus with diabetic chronic kidney disease: Secondary | ICD-10-CM | POA: Diagnosis not present

## 2016-10-03 DIAGNOSIS — J9621 Acute and chronic respiratory failure with hypoxia: Secondary | ICD-10-CM | POA: Diagnosis not present

## 2016-10-03 DIAGNOSIS — I5032 Chronic diastolic (congestive) heart failure: Secondary | ICD-10-CM | POA: Diagnosis not present

## 2016-10-03 DIAGNOSIS — I130 Hypertensive heart and chronic kidney disease with heart failure and stage 1 through stage 4 chronic kidney disease, or unspecified chronic kidney disease: Secondary | ICD-10-CM | POA: Diagnosis not present

## 2016-10-03 DIAGNOSIS — J9622 Acute and chronic respiratory failure with hypercapnia: Secondary | ICD-10-CM | POA: Diagnosis not present

## 2016-10-04 DIAGNOSIS — Z8781 Personal history of (healed) traumatic fracture: Secondary | ICD-10-CM | POA: Diagnosis not present

## 2016-10-04 DIAGNOSIS — J441 Chronic obstructive pulmonary disease with (acute) exacerbation: Secondary | ICD-10-CM | POA: Diagnosis not present

## 2016-10-04 DIAGNOSIS — Z8639 Personal history of other endocrine, nutritional and metabolic disease: Secondary | ICD-10-CM | POA: Diagnosis not present

## 2016-10-04 DIAGNOSIS — Z8669 Personal history of other diseases of the nervous system and sense organs: Secondary | ICD-10-CM | POA: Diagnosis not present

## 2016-10-04 DIAGNOSIS — Z9889 Other specified postprocedural states: Secondary | ICD-10-CM | POA: Diagnosis not present

## 2016-10-04 DIAGNOSIS — Z8719 Personal history of other diseases of the digestive system: Secondary | ICD-10-CM | POA: Diagnosis not present

## 2016-10-04 DIAGNOSIS — Z8619 Personal history of other infectious and parasitic diseases: Secondary | ICD-10-CM | POA: Diagnosis not present

## 2016-10-04 DIAGNOSIS — Z8679 Personal history of other diseases of the circulatory system: Secondary | ICD-10-CM | POA: Diagnosis not present

## 2016-10-04 DIAGNOSIS — Z8701 Personal history of pneumonia (recurrent): Secondary | ICD-10-CM | POA: Diagnosis not present

## 2016-10-05 DIAGNOSIS — Z9889 Other specified postprocedural states: Secondary | ICD-10-CM | POA: Diagnosis not present

## 2016-10-05 DIAGNOSIS — Z8679 Personal history of other diseases of the circulatory system: Secondary | ICD-10-CM | POA: Diagnosis not present

## 2016-10-05 DIAGNOSIS — Z8719 Personal history of other diseases of the digestive system: Secondary | ICD-10-CM | POA: Diagnosis not present

## 2016-10-05 DIAGNOSIS — Z8669 Personal history of other diseases of the nervous system and sense organs: Secondary | ICD-10-CM | POA: Diagnosis not present

## 2016-10-05 DIAGNOSIS — Z8639 Personal history of other endocrine, nutritional and metabolic disease: Secondary | ICD-10-CM | POA: Diagnosis not present

## 2016-10-05 DIAGNOSIS — J441 Chronic obstructive pulmonary disease with (acute) exacerbation: Secondary | ICD-10-CM | POA: Diagnosis not present

## 2016-10-06 ENCOUNTER — Encounter: Payer: Self-pay | Admitting: *Deleted

## 2016-10-06 ENCOUNTER — Other Ambulatory Visit: Payer: Self-pay | Admitting: *Deleted

## 2016-10-06 DIAGNOSIS — Z9889 Other specified postprocedural states: Secondary | ICD-10-CM | POA: Diagnosis not present

## 2016-10-06 DIAGNOSIS — J441 Chronic obstructive pulmonary disease with (acute) exacerbation: Secondary | ICD-10-CM | POA: Diagnosis not present

## 2016-10-06 DIAGNOSIS — Z8639 Personal history of other endocrine, nutritional and metabolic disease: Secondary | ICD-10-CM | POA: Diagnosis not present

## 2016-10-06 DIAGNOSIS — Z8719 Personal history of other diseases of the digestive system: Secondary | ICD-10-CM | POA: Diagnosis not present

## 2016-10-06 DIAGNOSIS — Z8669 Personal history of other diseases of the nervous system and sense organs: Secondary | ICD-10-CM | POA: Diagnosis not present

## 2016-10-06 DIAGNOSIS — Z8679 Personal history of other diseases of the circulatory system: Secondary | ICD-10-CM | POA: Diagnosis not present

## 2016-10-06 NOTE — Patient Outreach (Signed)
[facility]) Care Management   10/06/2016  Morgan [facility] August 24, 1940 [id number]  Morgan Hays is an 76 y.o. female  Subjective:              Patient discussed her problem with back pain this morning. Patient discussed she has been transitioned to hospice care she made the decision on last week, and nurse and chaplin to visit on today.Patient discussed she is familiar with hospice due to services  they provided  to her husband.      Objective:  BP 128/78 (BP Location: Right Arm, Patient Position: Prone, Cuff Size: Large)   Pulse 86   Resp 20   Wt 191 lb (86.6 kg)   BMI 31.78 kg/m   Resting in hospital bed.  Review of Systems  Constitutional: Negative.   HENT: Negative.   Eyes: Negative.   Respiratory: Positive for sputum production.   Cardiovascular: Positive for leg swelling.  Gastrointestinal:       Ileostomy  Genitourinary: Negative.   Musculoskeletal: Positive for back pain.  Skin: Negative.   Neurological: Negative.   Endo/Heme/Allergies: Bruises/bleeds easily.  Psychiatric/Behavioral: Negative.     Physical Exam  Constitutional: She is oriented to person, place, and time. She appears well-developed and well-nourished.  Cardiovascular: Normal rate, normal heart sounds and intact distal pulses.   Respiratory: Effort normal. She has wheezes.  GI: Soft.  Neurological: She is alert and oriented to person, place, and time.  Skin: Skin is warm and dry.  Ecchymotic areas bilateral lower legs and arms  Psychiatric: She has a normal mood and affect. Her behavior is normal. Judgment and thought content normal.    Encounter Medications:   Outpatient Encounter Prescriptions as of 10/06/2016  Medication Sig Note  . albuterol (PROVENTIL HFA;VENTOLIN HFA) 108 (90 Base) MCG/ACT inhaler Inhale 1-2 puffs into the lungs every 6 (six) hours as needed for wheezing or shortness of breath.   [name] amLODipine (NORVASC) 5 MG tablet Take 5 mg by mouth daily.   [name] aspirin 81 MG  tablet Take 81 mg by mouth daily.     . Calcium Carbonate (CALCIUM 500 PO) Take 1 capsule by mouth daily. Reported on 12/22/2015   . Cholecalciferol (VITAMIN D) 1000 UNITS capsule Take 1,000 Units by mouth daily.     . clonazePAM (KLONOPIN) 1 MG tablet Take 1 mg by mouth daily as needed for anxiety. Take one tablet daily as needed   . diphenhydrAMINE (SOMINEX) 25 MG tablet Take 25 mg by mouth at bedtime as needed for sleep. Reported on 11/25/2015   . fluticasone (FLONASE) 50 MCG/ACT nasal spray Place 2 sprays into both nostrils daily.   . furosemide (LASIX) 80 MG tablet Take 1 tablet (80 mg total) by mouth 2 (two) times daily. (Patient taking differently: Take 80 mg by mouth 2 (two) times daily. 80 mg in the morning and 40 mg in the evening)   . gabapentin (NEURONTIN) 300 MG capsule Take 300 mg by mouth 3 (three) times daily.    [name] ipratropium-albuterol (DUONEB) 0.5-2.5 (3) MG/3ML SOLN Take 3 mLs by nebulization every 4 (four) hours as needed.    . Iron-FA-B Cmp-C-Biot-Probiotic (FUSION PLUS) CAPS Take 1 capsule by mouth daily.   [name] levothyroxine (SYNTHROID, LEVOTHROID) 150 MCG tablet Take 150 mcg by mouth daily before breakfast.    . losartan (COZAAR) 100 MG tablet Take 50 mg by mouth daily.    . magnesium oxide (MAG-OX) 400 MG tablet Take 400 mg by mouth daily.     [name]  metFORMIN (GLUCOPHAGE-XR) 500 MG 24 hr tablet 1 tablet daily. 10/23/2013: Received from: External Pharmacy Received Sig:   . montelukast (SINGULAIR) 10 MG tablet Take 1 tablet (10 mg total) by mouth at bedtime.   [name] omeprazole (PRILOSEC) 20 MG capsule Take 1 capsule (20 mg total) by mouth daily.   . Oxycodone-Acetaminophen (PERCOCET PO) Take 5-325 mg by mouth every 8 (eight) hours as needed.    . pravastatin (PRAVACHOL) 40 MG tablet TAKE 1 TABLET ONCE DAILY.   [name] predniSONE (DELTASONE) 20 MG tablet Take 1 tablet (20 mg total) by mouth daily.   . sertraline (ZOLOFT) 50 MG tablet Take 50 mg by mouth 2 (two) times daily. Reported on 09/07/2015    . Tiotropium Bromide-Olodaterol (STIOLTO RESPIMAT) 2.5-2.5 MCG/ACT AERS Inhale 2 puffs into the lungs daily.   . calcium carbonate (OS-CAL) 600 MG TABS tablet Take 600 mg by mouth daily with breakfast. Reported on 11/25/2015    No facility-administered encounter medications on file as of 10/06/2016.     Functional Status:   In your present state of health, do you have any difficulty performing the following activities: 09/22/2016 07/14/2016  Hearing? N -  Vision? N -  Difficulty concentrating or making decisions? N -  Walking or climbing stairs? Y -  Dressing or bathing? N -  Doing errands, shopping? Y -  Quarry manager and eating ? Y Y  Using the Toilet? N N  In the past six months, have you accidently leaked urine? Y N  Do you have problems with loss of bowel control? Y N  Managing your Medications? N N  Managing your Finances? N N  Housekeeping or managing your Housekeeping? [name]  Some recent data might be hidden    Fall/Depression Screening:    PHQ 2/9 Scores 09/22/2016 01/26/2016 12/22/2015 11/25/2015 09/29/2015 07/24/2015 06/22/2015  PHQ - 2 Score 0 PHQ- 9 Score - 2 - - - 8 5    Assessment:    COPD - breathing at her baseline today per patient, to begin Hospice services.  Diabetes - blood sugar this morning 117, patient reports eating what she wants to. Lower legs swelling- decreased somewhat per patient reports, continues medications as prescribed. Chronic back pain increased today after moving around during having her bath.    Patient is now active with [facility], explained that they are now managing her care and [facility] care services would be a duplication of services, and will close our services. Patient voiced understanding.    Plan:  Placed call to [facility] to verify patient is active with their services, confirmed after speaking with Donette. Will close case to care management , patient care to be managed by [facility]. Will send PCP case closure letter as well as notify CMA.   Egbert Garibaldi, RN, [facility] Care Management [phone]- Mobile [phone]- Toll Free Main Office

## 2016-10-07 ENCOUNTER — Other Ambulatory Visit (HOSPITAL_COMMUNITY): Payer: Medicare Other

## 2016-10-11 DIAGNOSIS — J441 Chronic obstructive pulmonary disease with (acute) exacerbation: Secondary | ICD-10-CM | POA: Diagnosis not present

## 2016-10-11 DIAGNOSIS — Z9889 Other specified postprocedural states: Secondary | ICD-10-CM | POA: Diagnosis not present

## 2016-10-11 DIAGNOSIS — Z8719 Personal history of other diseases of the digestive system: Secondary | ICD-10-CM | POA: Diagnosis not present

## 2016-10-11 DIAGNOSIS — Z8679 Personal history of other diseases of the circulatory system: Secondary | ICD-10-CM | POA: Diagnosis not present

## 2016-10-11 DIAGNOSIS — Z8669 Personal history of other diseases of the nervous system and sense organs: Secondary | ICD-10-CM | POA: Diagnosis not present

## 2016-10-11 DIAGNOSIS — Z8639 Personal history of other endocrine, nutritional and metabolic disease: Secondary | ICD-10-CM | POA: Diagnosis not present

## 2016-10-12 DIAGNOSIS — J441 Chronic obstructive pulmonary disease with (acute) exacerbation: Secondary | ICD-10-CM | POA: Diagnosis not present

## 2016-10-12 DIAGNOSIS — Z8639 Personal history of other endocrine, nutritional and metabolic disease: Secondary | ICD-10-CM | POA: Diagnosis not present

## 2016-10-12 DIAGNOSIS — Z8719 Personal history of other diseases of the digestive system: Secondary | ICD-10-CM | POA: Diagnosis not present

## 2016-10-12 DIAGNOSIS — Z8679 Personal history of other diseases of the circulatory system: Secondary | ICD-10-CM | POA: Diagnosis not present

## 2016-10-12 DIAGNOSIS — Z8669 Personal history of other diseases of the nervous system and sense organs: Secondary | ICD-10-CM | POA: Diagnosis not present

## 2016-10-12 DIAGNOSIS — Z9889 Other specified postprocedural states: Secondary | ICD-10-CM | POA: Diagnosis not present

## 2016-10-13 DIAGNOSIS — Z8669 Personal history of other diseases of the nervous system and sense organs: Secondary | ICD-10-CM | POA: Diagnosis not present

## 2016-10-13 DIAGNOSIS — Z8639 Personal history of other endocrine, nutritional and metabolic disease: Secondary | ICD-10-CM | POA: Diagnosis not present

## 2016-10-13 DIAGNOSIS — Z8679 Personal history of other diseases of the circulatory system: Secondary | ICD-10-CM | POA: Diagnosis not present

## 2016-10-13 DIAGNOSIS — Z8719 Personal history of other diseases of the digestive system: Secondary | ICD-10-CM | POA: Diagnosis not present

## 2016-10-13 DIAGNOSIS — J441 Chronic obstructive pulmonary disease with (acute) exacerbation: Secondary | ICD-10-CM | POA: Diagnosis not present

## 2016-10-13 DIAGNOSIS — Z9889 Other specified postprocedural states: Secondary | ICD-10-CM | POA: Diagnosis not present

## 2016-10-14 ENCOUNTER — Other Ambulatory Visit: Payer: Self-pay

## 2016-10-14 ENCOUNTER — Ambulatory Visit (HOSPITAL_COMMUNITY): Payer: Medicare Other | Attending: Cardiovascular Disease

## 2016-10-14 DIAGNOSIS — Z8719 Personal history of other diseases of the digestive system: Secondary | ICD-10-CM | POA: Diagnosis not present

## 2016-10-14 DIAGNOSIS — Z8679 Personal history of other diseases of the circulatory system: Secondary | ICD-10-CM | POA: Diagnosis not present

## 2016-10-14 DIAGNOSIS — J441 Chronic obstructive pulmonary disease with (acute) exacerbation: Secondary | ICD-10-CM | POA: Diagnosis not present

## 2016-10-14 DIAGNOSIS — I712 Thoracic aortic aneurysm, without rupture: Secondary | ICD-10-CM | POA: Diagnosis not present

## 2016-10-14 DIAGNOSIS — Z8639 Personal history of other endocrine, nutritional and metabolic disease: Secondary | ICD-10-CM | POA: Diagnosis not present

## 2016-10-14 DIAGNOSIS — R0600 Dyspnea, unspecified: Secondary | ICD-10-CM | POA: Diagnosis not present

## 2016-10-14 DIAGNOSIS — Z8669 Personal history of other diseases of the nervous system and sense organs: Secondary | ICD-10-CM | POA: Diagnosis not present

## 2016-10-14 DIAGNOSIS — Z9889 Other specified postprocedural states: Secondary | ICD-10-CM | POA: Diagnosis not present

## 2016-10-15 ENCOUNTER — Encounter: Payer: Self-pay | Admitting: Cardiology

## 2016-10-15 NOTE — Progress Notes (Signed)
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 Images not available for my review. I contacted pt with results. She has severe COPD and is felt not to be a surgical candidate. She is also now on hospice care. We will therefore plan conservative measures; there was no pericardial effusion or AI. Pt in agreement. Olga Millers, MD

## 2016-10-17 DIAGNOSIS — Z8719 Personal history of other diseases of the digestive system: Secondary | ICD-10-CM | POA: Diagnosis not present

## 2016-10-17 DIAGNOSIS — Z9889 Other specified postprocedural states: Secondary | ICD-10-CM | POA: Diagnosis not present

## 2016-10-17 DIAGNOSIS — Z8639 Personal history of other endocrine, nutritional and metabolic disease: Secondary | ICD-10-CM | POA: Diagnosis not present

## 2016-10-17 DIAGNOSIS — Z8669 Personal history of other diseases of the nervous system and sense organs: Secondary | ICD-10-CM | POA: Diagnosis not present

## 2016-10-17 DIAGNOSIS — Z8679 Personal history of other diseases of the circulatory system: Secondary | ICD-10-CM | POA: Diagnosis not present

## 2016-10-17 DIAGNOSIS — J441 Chronic obstructive pulmonary disease with (acute) exacerbation: Secondary | ICD-10-CM | POA: Diagnosis not present

## 2016-10-18 DIAGNOSIS — Z8679 Personal history of other diseases of the circulatory system: Secondary | ICD-10-CM | POA: Diagnosis not present

## 2016-10-18 DIAGNOSIS — Z8719 Personal history of other diseases of the digestive system: Secondary | ICD-10-CM | POA: Diagnosis not present

## 2016-10-18 DIAGNOSIS — Z8669 Personal history of other diseases of the nervous system and sense organs: Secondary | ICD-10-CM | POA: Diagnosis not present

## 2016-10-18 DIAGNOSIS — Z9889 Other specified postprocedural states: Secondary | ICD-10-CM | POA: Diagnosis not present

## 2016-10-18 DIAGNOSIS — Z8639 Personal history of other endocrine, nutritional and metabolic disease: Secondary | ICD-10-CM | POA: Diagnosis not present

## 2016-10-18 DIAGNOSIS — J441 Chronic obstructive pulmonary disease with (acute) exacerbation: Secondary | ICD-10-CM | POA: Diagnosis not present

## 2016-10-19 DIAGNOSIS — Z8639 Personal history of other endocrine, nutritional and metabolic disease: Secondary | ICD-10-CM | POA: Diagnosis not present

## 2016-10-19 DIAGNOSIS — Z8679 Personal history of other diseases of the circulatory system: Secondary | ICD-10-CM | POA: Diagnosis not present

## 2016-10-19 DIAGNOSIS — Z8719 Personal history of other diseases of the digestive system: Secondary | ICD-10-CM | POA: Diagnosis not present

## 2016-10-19 DIAGNOSIS — Z8669 Personal history of other diseases of the nervous system and sense organs: Secondary | ICD-10-CM | POA: Diagnosis not present

## 2016-10-19 DIAGNOSIS — J441 Chronic obstructive pulmonary disease with (acute) exacerbation: Secondary | ICD-10-CM | POA: Diagnosis not present

## 2016-10-19 DIAGNOSIS — Z9889 Other specified postprocedural states: Secondary | ICD-10-CM | POA: Diagnosis not present

## 2016-10-21 DIAGNOSIS — Z8669 Personal history of other diseases of the nervous system and sense organs: Secondary | ICD-10-CM | POA: Diagnosis not present

## 2016-10-21 DIAGNOSIS — J441 Chronic obstructive pulmonary disease with (acute) exacerbation: Secondary | ICD-10-CM | POA: Diagnosis not present

## 2016-10-21 DIAGNOSIS — Z9889 Other specified postprocedural states: Secondary | ICD-10-CM | POA: Diagnosis not present

## 2016-10-21 DIAGNOSIS — Z8679 Personal history of other diseases of the circulatory system: Secondary | ICD-10-CM | POA: Diagnosis not present

## 2016-10-21 DIAGNOSIS — Z8719 Personal history of other diseases of the digestive system: Secondary | ICD-10-CM | POA: Diagnosis not present

## 2016-10-21 DIAGNOSIS — Z8639 Personal history of other endocrine, nutritional and metabolic disease: Secondary | ICD-10-CM | POA: Diagnosis not present

## 2016-10-24 DIAGNOSIS — Z8679 Personal history of other diseases of the circulatory system: Secondary | ICD-10-CM | POA: Diagnosis not present

## 2016-10-24 DIAGNOSIS — Z9889 Other specified postprocedural states: Secondary | ICD-10-CM | POA: Diagnosis not present

## 2016-10-24 DIAGNOSIS — Z8719 Personal history of other diseases of the digestive system: Secondary | ICD-10-CM | POA: Diagnosis not present

## 2016-10-24 DIAGNOSIS — Z8669 Personal history of other diseases of the nervous system and sense organs: Secondary | ICD-10-CM | POA: Diagnosis not present

## 2016-10-24 DIAGNOSIS — Z8639 Personal history of other endocrine, nutritional and metabolic disease: Secondary | ICD-10-CM | POA: Diagnosis not present

## 2016-10-24 DIAGNOSIS — J441 Chronic obstructive pulmonary disease with (acute) exacerbation: Secondary | ICD-10-CM | POA: Diagnosis not present

## 2016-10-25 ENCOUNTER — Encounter: Payer: Self-pay | Admitting: Cardiology

## 2016-10-25 DIAGNOSIS — Z8719 Personal history of other diseases of the digestive system: Secondary | ICD-10-CM | POA: Diagnosis not present

## 2016-10-25 DIAGNOSIS — Z8669 Personal history of other diseases of the nervous system and sense organs: Secondary | ICD-10-CM | POA: Diagnosis not present

## 2016-10-25 DIAGNOSIS — Z9889 Other specified postprocedural states: Secondary | ICD-10-CM | POA: Diagnosis not present

## 2016-10-25 DIAGNOSIS — Z8679 Personal history of other diseases of the circulatory system: Secondary | ICD-10-CM | POA: Diagnosis not present

## 2016-10-25 DIAGNOSIS — J441 Chronic obstructive pulmonary disease with (acute) exacerbation: Secondary | ICD-10-CM | POA: Diagnosis not present

## 2016-10-25 DIAGNOSIS — Z8639 Personal history of other endocrine, nutritional and metabolic disease: Secondary | ICD-10-CM | POA: Diagnosis not present

## 2016-10-26 DIAGNOSIS — J441 Chronic obstructive pulmonary disease with (acute) exacerbation: Secondary | ICD-10-CM | POA: Diagnosis not present

## 2016-10-26 DIAGNOSIS — Z8669 Personal history of other diseases of the nervous system and sense organs: Secondary | ICD-10-CM | POA: Diagnosis not present

## 2016-10-26 DIAGNOSIS — Z8639 Personal history of other endocrine, nutritional and metabolic disease: Secondary | ICD-10-CM | POA: Diagnosis not present

## 2016-10-26 DIAGNOSIS — Z8719 Personal history of other diseases of the digestive system: Secondary | ICD-10-CM | POA: Diagnosis not present

## 2016-10-26 DIAGNOSIS — Z9889 Other specified postprocedural states: Secondary | ICD-10-CM | POA: Diagnosis not present

## 2016-10-26 DIAGNOSIS — Z8679 Personal history of other diseases of the circulatory system: Secondary | ICD-10-CM | POA: Diagnosis not present

## 2016-10-27 DIAGNOSIS — Z8719 Personal history of other diseases of the digestive system: Secondary | ICD-10-CM | POA: Diagnosis not present

## 2016-10-27 DIAGNOSIS — Z8669 Personal history of other diseases of the nervous system and sense organs: Secondary | ICD-10-CM | POA: Diagnosis not present

## 2016-10-27 DIAGNOSIS — Z8679 Personal history of other diseases of the circulatory system: Secondary | ICD-10-CM | POA: Diagnosis not present

## 2016-10-27 DIAGNOSIS — J441 Chronic obstructive pulmonary disease with (acute) exacerbation: Secondary | ICD-10-CM | POA: Diagnosis not present

## 2016-10-27 DIAGNOSIS — Z9889 Other specified postprocedural states: Secondary | ICD-10-CM | POA: Diagnosis not present

## 2016-10-27 DIAGNOSIS — Z8639 Personal history of other endocrine, nutritional and metabolic disease: Secondary | ICD-10-CM | POA: Diagnosis not present

## 2016-10-28 DIAGNOSIS — Z8669 Personal history of other diseases of the nervous system and sense organs: Secondary | ICD-10-CM | POA: Diagnosis not present

## 2016-10-28 DIAGNOSIS — J441 Chronic obstructive pulmonary disease with (acute) exacerbation: Secondary | ICD-10-CM | POA: Diagnosis not present

## 2016-10-28 DIAGNOSIS — Z8639 Personal history of other endocrine, nutritional and metabolic disease: Secondary | ICD-10-CM | POA: Diagnosis not present

## 2016-10-28 DIAGNOSIS — Z8719 Personal history of other diseases of the digestive system: Secondary | ICD-10-CM | POA: Diagnosis not present

## 2016-10-28 DIAGNOSIS — Z8679 Personal history of other diseases of the circulatory system: Secondary | ICD-10-CM | POA: Diagnosis not present

## 2016-10-28 DIAGNOSIS — Z9889 Other specified postprocedural states: Secondary | ICD-10-CM | POA: Diagnosis not present

## 2016-10-29 DIAGNOSIS — Z9889 Other specified postprocedural states: Secondary | ICD-10-CM | POA: Diagnosis not present

## 2016-10-29 DIAGNOSIS — J441 Chronic obstructive pulmonary disease with (acute) exacerbation: Secondary | ICD-10-CM | POA: Diagnosis not present

## 2016-10-29 DIAGNOSIS — Z8639 Personal history of other endocrine, nutritional and metabolic disease: Secondary | ICD-10-CM | POA: Diagnosis not present

## 2016-10-29 DIAGNOSIS — Z8679 Personal history of other diseases of the circulatory system: Secondary | ICD-10-CM | POA: Diagnosis not present

## 2016-10-29 DIAGNOSIS — Z8719 Personal history of other diseases of the digestive system: Secondary | ICD-10-CM | POA: Diagnosis not present

## 2016-10-29 DIAGNOSIS — Z8669 Personal history of other diseases of the nervous system and sense organs: Secondary | ICD-10-CM | POA: Diagnosis not present

## 2016-10-30 DIAGNOSIS — Z8719 Personal history of other diseases of the digestive system: Secondary | ICD-10-CM | POA: Diagnosis not present

## 2016-10-30 DIAGNOSIS — Z8669 Personal history of other diseases of the nervous system and sense organs: Secondary | ICD-10-CM | POA: Diagnosis not present

## 2016-10-30 DIAGNOSIS — J441 Chronic obstructive pulmonary disease with (acute) exacerbation: Secondary | ICD-10-CM | POA: Diagnosis not present

## 2016-10-30 DIAGNOSIS — Z8679 Personal history of other diseases of the circulatory system: Secondary | ICD-10-CM | POA: Diagnosis not present

## 2016-10-30 DIAGNOSIS — Z8639 Personal history of other endocrine, nutritional and metabolic disease: Secondary | ICD-10-CM | POA: Diagnosis not present

## 2016-10-30 DIAGNOSIS — Z9889 Other specified postprocedural states: Secondary | ICD-10-CM | POA: Diagnosis not present

## 2016-10-31 DIAGNOSIS — Z8639 Personal history of other endocrine, nutritional and metabolic disease: Secondary | ICD-10-CM | POA: Diagnosis not present

## 2016-10-31 DIAGNOSIS — Z8669 Personal history of other diseases of the nervous system and sense organs: Secondary | ICD-10-CM | POA: Diagnosis not present

## 2016-10-31 DIAGNOSIS — J441 Chronic obstructive pulmonary disease with (acute) exacerbation: Secondary | ICD-10-CM | POA: Diagnosis not present

## 2016-10-31 DIAGNOSIS — Z9889 Other specified postprocedural states: Secondary | ICD-10-CM | POA: Diagnosis not present

## 2016-10-31 DIAGNOSIS — Z8679 Personal history of other diseases of the circulatory system: Secondary | ICD-10-CM | POA: Diagnosis not present

## 2016-10-31 DIAGNOSIS — Z8719 Personal history of other diseases of the digestive system: Secondary | ICD-10-CM | POA: Diagnosis not present

## 2016-11-01 ENCOUNTER — Ambulatory Visit: Payer: Medicare Other | Admitting: Emergency Medicine

## 2016-11-01 DIAGNOSIS — Z8719 Personal history of other diseases of the digestive system: Secondary | ICD-10-CM | POA: Diagnosis not present

## 2016-11-01 DIAGNOSIS — Z8679 Personal history of other diseases of the circulatory system: Secondary | ICD-10-CM | POA: Diagnosis not present

## 2016-11-01 DIAGNOSIS — Z8781 Personal history of (healed) traumatic fracture: Secondary | ICD-10-CM | POA: Diagnosis not present

## 2016-11-01 DIAGNOSIS — J441 Chronic obstructive pulmonary disease with (acute) exacerbation: Secondary | ICD-10-CM | POA: Diagnosis not present

## 2016-11-01 DIAGNOSIS — Z8701 Personal history of pneumonia (recurrent): Secondary | ICD-10-CM | POA: Diagnosis not present

## 2016-11-01 DIAGNOSIS — Z8619 Personal history of other infectious and parasitic diseases: Secondary | ICD-10-CM | POA: Diagnosis not present

## 2016-11-01 DIAGNOSIS — Z8669 Personal history of other diseases of the nervous system and sense organs: Secondary | ICD-10-CM | POA: Diagnosis not present

## 2016-11-01 DIAGNOSIS — Z9889 Other specified postprocedural states: Secondary | ICD-10-CM | POA: Diagnosis not present

## 2016-11-01 DIAGNOSIS — Z8639 Personal history of other endocrine, nutritional and metabolic disease: Secondary | ICD-10-CM | POA: Diagnosis not present

## 2016-11-02 DIAGNOSIS — Z9889 Other specified postprocedural states: Secondary | ICD-10-CM | POA: Diagnosis not present

## 2016-11-02 DIAGNOSIS — Z8639 Personal history of other endocrine, nutritional and metabolic disease: Secondary | ICD-10-CM | POA: Diagnosis not present

## 2016-11-02 DIAGNOSIS — Z8719 Personal history of other diseases of the digestive system: Secondary | ICD-10-CM | POA: Diagnosis not present

## 2016-11-02 DIAGNOSIS — J441 Chronic obstructive pulmonary disease with (acute) exacerbation: Secondary | ICD-10-CM | POA: Diagnosis not present

## 2016-11-02 DIAGNOSIS — Z8669 Personal history of other diseases of the nervous system and sense organs: Secondary | ICD-10-CM | POA: Diagnosis not present

## 2016-11-02 DIAGNOSIS — Z8679 Personal history of other diseases of the circulatory system: Secondary | ICD-10-CM | POA: Diagnosis not present

## 2016-11-03 DIAGNOSIS — Z9889 Other specified postprocedural states: Secondary | ICD-10-CM | POA: Diagnosis not present

## 2016-11-03 DIAGNOSIS — J441 Chronic obstructive pulmonary disease with (acute) exacerbation: Secondary | ICD-10-CM | POA: Diagnosis not present

## 2016-11-03 DIAGNOSIS — Z8679 Personal history of other diseases of the circulatory system: Secondary | ICD-10-CM | POA: Diagnosis not present

## 2016-11-03 DIAGNOSIS — Z8669 Personal history of other diseases of the nervous system and sense organs: Secondary | ICD-10-CM | POA: Diagnosis not present

## 2016-11-03 DIAGNOSIS — Z8719 Personal history of other diseases of the digestive system: Secondary | ICD-10-CM | POA: Diagnosis not present

## 2016-11-03 DIAGNOSIS — Z8639 Personal history of other endocrine, nutritional and metabolic disease: Secondary | ICD-10-CM | POA: Diagnosis not present

## 2016-11-04 DIAGNOSIS — Z8639 Personal history of other endocrine, nutritional and metabolic disease: Secondary | ICD-10-CM | POA: Diagnosis not present

## 2016-11-04 DIAGNOSIS — Z8679 Personal history of other diseases of the circulatory system: Secondary | ICD-10-CM | POA: Diagnosis not present

## 2016-11-04 DIAGNOSIS — J441 Chronic obstructive pulmonary disease with (acute) exacerbation: Secondary | ICD-10-CM | POA: Diagnosis not present

## 2016-11-04 DIAGNOSIS — Z8669 Personal history of other diseases of the nervous system and sense organs: Secondary | ICD-10-CM | POA: Diagnosis not present

## 2016-11-04 DIAGNOSIS — Z9889 Other specified postprocedural states: Secondary | ICD-10-CM | POA: Diagnosis not present

## 2016-11-04 DIAGNOSIS — Z8719 Personal history of other diseases of the digestive system: Secondary | ICD-10-CM | POA: Diagnosis not present

## 2016-11-05 DIAGNOSIS — Z8639 Personal history of other endocrine, nutritional and metabolic disease: Secondary | ICD-10-CM | POA: Diagnosis not present

## 2016-11-05 DIAGNOSIS — Z8669 Personal history of other diseases of the nervous system and sense organs: Secondary | ICD-10-CM | POA: Diagnosis not present

## 2016-11-05 DIAGNOSIS — Z9889 Other specified postprocedural states: Secondary | ICD-10-CM | POA: Diagnosis not present

## 2016-11-05 DIAGNOSIS — J441 Chronic obstructive pulmonary disease with (acute) exacerbation: Secondary | ICD-10-CM | POA: Diagnosis not present

## 2016-11-05 DIAGNOSIS — Z8719 Personal history of other diseases of the digestive system: Secondary | ICD-10-CM | POA: Diagnosis not present

## 2016-11-05 DIAGNOSIS — Z8679 Personal history of other diseases of the circulatory system: Secondary | ICD-10-CM | POA: Diagnosis not present

## 2016-11-06 DIAGNOSIS — Z8669 Personal history of other diseases of the nervous system and sense organs: Secondary | ICD-10-CM | POA: Diagnosis not present

## 2016-11-06 DIAGNOSIS — Z8679 Personal history of other diseases of the circulatory system: Secondary | ICD-10-CM | POA: Diagnosis not present

## 2016-11-06 DIAGNOSIS — Z8639 Personal history of other endocrine, nutritional and metabolic disease: Secondary | ICD-10-CM | POA: Diagnosis not present

## 2016-11-06 DIAGNOSIS — J441 Chronic obstructive pulmonary disease with (acute) exacerbation: Secondary | ICD-10-CM | POA: Diagnosis not present

## 2016-11-06 DIAGNOSIS — Z8719 Personal history of other diseases of the digestive system: Secondary | ICD-10-CM | POA: Diagnosis not present

## 2016-11-06 DIAGNOSIS — Z9889 Other specified postprocedural states: Secondary | ICD-10-CM | POA: Diagnosis not present

## 2016-11-14 ENCOUNTER — Ambulatory Visit: Payer: Medicare Other | Admitting: Cardiology

## 2016-11-25 ENCOUNTER — Ambulatory Visit: Payer: Medicare Other | Admitting: Cardiology

## 2016-12-02 DEATH — deceased

## 2017-01-02 ENCOUNTER — Other Ambulatory Visit: Payer: Self-pay

## 2017-01-02 MED ORDER — PRAVASTATIN SODIUM 40 MG PO TABS: 40.0000 mg | ORAL_TABLET | Freq: Every day | ORAL | 2 refills | Status: AC
